# Patient Record
Sex: Male | Born: 1990 | Race: White | Hispanic: No | Marital: Single | Smoking: Never smoker
Health system: Southern US, Community
[De-identification: ages and names within clinical notes are randomized; demographics above are authoritative.]

## PROBLEM LIST (undated history)

## (undated) DIAGNOSIS — M109 Gout, unspecified: Secondary | ICD-10-CM

## (undated) DIAGNOSIS — I10 Essential (primary) hypertension: Secondary | ICD-10-CM

## (undated) DIAGNOSIS — N200 Calculus of kidney: Secondary | ICD-10-CM

## (undated) HISTORY — PX: KIDNEY STONE SURGERY: SHX686

## (undated) HISTORY — PX: APPENDECTOMY: SHX54

---
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
## 2014-11-23 ENCOUNTER — Emergency Department
Admission: EM | Admit: 2014-11-23 | Discharge: 2014-11-23 | Disposition: A | Discharge status: Home / Self Care | Payer: Self-pay | Attending: Emergency Medicine | Admitting: Emergency Medicine

## 2014-11-23 ENCOUNTER — Emergency Department: Payer: Self-pay

## 2014-11-23 DIAGNOSIS — N200 Calculus of kidney: Secondary | ICD-10-CM | POA: Insufficient documentation

## 2014-11-23 DIAGNOSIS — R109 Unspecified abdominal pain: Secondary | ICD-10-CM

## 2014-11-23 LAB — URINALYSIS COMPLETE WITH MICROSCOPIC (ARMC ONLY)
BACTERIA UA: NONE SEEN
Bilirubin Urine: NEGATIVE
Glucose, UA: NEGATIVE mg/dL
Ketones, ur: NEGATIVE mg/dL
LEUKOCYTES UA: NEGATIVE
Nitrite: NEGATIVE
PH: 6 (ref 5.0–8.0)
PROTEIN: NEGATIVE mg/dL
Specific Gravity, Urine: 1.014 (ref 1.005–1.030)

## 2014-11-23 LAB — CBC
HEMATOCRIT: 42.9 % (ref 40.0–52.0)
Hemoglobin: 15.1 g/dL (ref 13.0–18.0)
MCH: 34.7 pg — AB (ref 26.0–34.0)
MCHC: 35.2 g/dL (ref 32.0–36.0)
MCV: 98.6 fL (ref 80.0–100.0)
PLATELETS: 145 10*3/uL — AB (ref 150–440)
RBC: 4.35 MIL/uL — AB (ref 4.40–5.90)
RDW: 13.2 % (ref 11.5–14.5)
WBC: 7.2 10*3/uL (ref 3.8–10.6)

## 2014-11-23 LAB — BASIC METABOLIC PANEL
ANION GAP: 10 (ref 5–15)
BUN: 15 mg/dL (ref 6–20)
CO2: 28 mmol/L (ref 22–32)
Calcium: 9.5 mg/dL (ref 8.9–10.3)
Chloride: 98 mmol/L — ABNORMAL LOW (ref 101–111)
Creatinine, Ser: 1.28 mg/dL — ABNORMAL HIGH (ref 0.61–1.24)
GFR calc Af Amer: >60 mL/min (ref >60)
GLUCOSE: 129 mg/dL — AB (ref 65–99)
POTASSIUM: 3.3 mmol/L — AB (ref 3.5–5.1)
Sodium: 136 mmol/L (ref 135–145)

## 2014-11-23 MED ORDER — HYDROMORPHONE HCL 1 MG/ML IJ SOLN
INTRAMUSCULAR | Status: AC
  Administered 2014-11-23: 1 mg via INTRAVENOUS
  Filled 2014-11-23: qty 1

## 2014-11-23 MED ORDER — OXYCODONE-ACETAMINOPHEN 5-325 MG PO TABS: 1.0000 | ORAL_TABLET | Freq: Four times a day (QID) | ORAL | Status: DC | PRN

## 2014-11-23 MED ORDER — ONDANSETRON 4 MG PO TBDP
4.0000 mg | ORAL_TABLET | Freq: Four times a day (QID) | ORAL | Status: DC | PRN
Start: 2014-11-23 — End: 2018-03-30

## 2014-11-23 MED ORDER — HYDROMORPHONE HCL 1 MG/ML IJ SOLN
1.0000 mg | Freq: Once | INTRAMUSCULAR | Status: AC
  Administered 2014-11-23: 1 mg via INTRAVENOUS

## 2014-11-23 MED ORDER — ONDANSETRON HCL 4 MG/2ML IJ SOLN
INTRAMUSCULAR | Status: AC
  Administered 2014-11-23: 4 mg via INTRAVENOUS
  Filled 2014-11-23: qty 2

## 2014-11-23 MED ORDER — KETOROLAC TROMETHAMINE 30 MG/ML IJ SOLN: 30.0000 mg | Freq: Once | INTRAMUSCULAR | Status: DC

## 2014-11-23 MED ORDER — KETOROLAC TROMETHAMINE 30 MG/ML IJ SOLN
30.0000 mg | Freq: Once | INTRAMUSCULAR | Status: AC
  Administered 2014-11-23: 30 mg via INTRAVENOUS
  Filled 2014-11-23: qty 1

## 2014-11-23 MED ORDER — ONDANSETRON HCL 4 MG/2ML IJ SOLN
4.0000 mg | Freq: Once | INTRAMUSCULAR | Status: AC
  Administered 2014-11-23: 4 mg via INTRAVENOUS

## 2014-11-23 NOTE — ED Notes (Signed)
Pt in with co left flank pian that started around 0300 with generalized abd pain.

## 2014-11-23 NOTE — ED Provider Notes (Signed)
[facility] Emergency Department Provider Note  ____________________________________________  Time seen: Approximately 7:10 AM  I have reviewed the triage vital signs and the nursing notes.   HISTORY  Chief Complaint Flank Pain    HPI Curtis Erickson is a 24 y.o. male with no medical history except for his appendix previously removed. He reports waking up during the early morning with severe pain in his left back and flank. Reports a sharp and heavy pain in the left flank. He had up and did have the urge to urinate once but did not know same blood. No fevers or chills. Some nausea, no vomiting. No other pain. No chest pain. No trouble breathing. No pain in the groin or testicles.   No past medical history on file.  There are no active problems to display for this patient.   No past surgical history on file.  Current Outpatient Rx  Name  Route  Sig  Dispense  Refill  . ondansetron (ZOFRAN ODT) 4 MG disintegrating tablet   Oral   Take 1 tablet (4 mg total) by mouth every 6 (six) hours as needed for nausea or vomiting.   20 tablet   0   . oxyCODONE-acetaminophen (ROXICET) 5-325 MG per tablet   Oral   Take 1 tablet by mouth every 6 (six) hours as needed for severe pain.   20 tablet   0     Allergies Review of patient's allergies indicates no known allergies.  No family history on file.  Social History Social History  Substance Use Topics  . Smoking status: Not on file  . Smokeless tobacco: Not on file  . Alcohol Use: Not on file   occasional alcohol use, nonsmoker  Review of Systems Constitutional: No fever/chills Eyes: No visual changes. ENT: No sore throat. Cardiovascular: Denies chest pain. Respiratory: Denies shortness of breath. Gastrointestinal: See history of present illness No diarrhea.  No constipation. Genitourinary: Negative for dysuria. Musculoskeletal: Negative for back pain except along the left flank Skin: Negative for  rash. Neurological: Negative for headaches, focal weakness or numbness.  10-point ROS otherwise negative.  ____________________________________________   PHYSICAL EXAM:  VITAL SIGNS: ED Triage Vitals  Enc Vitals Group     BP 11/23/14 0518 156/87 mmHg     Pulse Rate 11/23/14 0518 70     Resp 11/23/14 0518 18     Temp 11/23/14 0518 98.3 F (36.8 C)     Temp Source 11/23/14 0518 Oral     SpO2 11/23/14 0518 97 %     Weight 11/23/14 0518 190 lb (86.183 kg)     Height 11/23/14 0518  (1.753 m)     Head Cir --      Peak Flow --      Pain Score 11/23/14 0519 9     Pain Loc --      Pain Edu? --      Excl. in GC? --     Constitutional: Alert and oriented. Well appearing and in no acute distress. Eyes: Conjunctivae are normal. PERRL. EOMI. Head: Atraumatic. Nose: No congestion/rhinnorhea. Mouth/Throat: Mucous membranes are moist.  Oropharynx non-erythematous. Neck: No stridor.   Cardiovascular: Normal rate, regular rhythm. Grossly normal heart sounds.  Good peripheral circulation. Respiratory: Normal respiratory effort.  No retractions. Lungs CTAB. Gastrointestinal: Soft and nontender except for some moderate discomfort in the left flank. No distention. No abdominal bruits. Moderate left CVA tenderness. No right CVA tenderness. Normal testicles, nontender. No groin pain or mass.  Normal penis. Musculoskeletal: No lower extremity tenderness nor edema.  No joint effusions. Neurologic:  Normal speech and language. No gross focal neurologic deficits are appreciated. No gait instability. Skin:  Skin is warm, dry and intact. No rash noted. Psychiatric: Mood and affect are normal. Speech and behavior are normal.  ____________________________________________   LABS (all labs ordered are listed, but only abnormal results are displayed)  Labs Reviewed  CBC - Abnormal; Notable for the following:    RBC 4.35 (*)    MCH 34.7 (*)    Platelets 145 (*)    All other components within  normal limits  BASIC METABOLIC PANEL - Abnormal; Notable for the following:    Potassium 3.3 (*)    Chloride 98 (*)    Glucose, Bld 129 (*)    Creatinine, Ser 1.28 (*)    All other components within normal limits  URINALYSIS COMPLETEWITH MICROSCOPIC ([facility] ONLY) - Abnormal; Notable for the following:    Color, Urine YELLOW (*)    APPearance CLEAR (*)    Hgb urine dipstick 1+ (*)    Squamous Epithelial / LPF 0-5 (*)    All other components within normal limits   ____________________________________________  EKG   ____________________________________________  RADIOLOGY  3 mm calculus left ureterovesical junction with severe hydronephrosis on the left. There is a 1 mm nonobstructing calculus in the left kidney midportion.  Appendix absent.  Wall thickening in the urinary bladder. Suspect a degree of underlying cystitis.  No right-sided renal or ureteral calculus. No hydronephrosis on the right.  No bowel obstruction. No abscess. ____________________________________________   PROCEDURES  Procedure(s) performed: None  Critical Care performed: No  ____________________________________________   INITIAL IMPRESSION / ASSESSMENT AND PLAN / ED COURSE  Pertinent labs & imaging results that were available during my care of the patient were reviewed by me and considered in my medical decision making (see chart for details).  Sudden onset of left flank pain at 3 AM. Otherwise healthy young male with the history of appendix removed. Differential diagnosis would include kidney stone, nephrolithiasis is most likely etiologies, though other considerations including gastritis, perforation, ulcer, diverticulitis, bowel obstruction are considered. In the setting of sudden onset left flank pain, I will evaluate with CT without contrast for what clinically appears most apparently a kidney stone. No evidence of fever or infection. Urinalysis shows no signs of infection.  CT scan  demonstrates a 3 mm stone on the left, which correlates the patient's symptoms. Appears patient is presenting with a left-sided kidney stone without evidence of acute infection or complication. There is notable hydronephrosis, but the setting of a 3 mm down I would expect the patient could pass this at home with good return precautions discussed with him and family. He is agreeable.  I will prescribe the patient a narcotic pain medicine due to their condition which I anticipate will cause at least moderate pain short term. I discussed with the patient safe use of narcotic pain medicines, and that they are not to drive, work in dangerous areas, or ever take more than prescribed (no more than 1 pill every 6 hours). We discussed that this is the type of medication that "Criss Alvine" may have overdosed on and the risks of this type of medicine. Patient is very agreeable to only use as prescribed and to never use more than prescribed.  ____________________________________________   FINAL CLINICAL IMPRESSION(S) / ED DIAGNOSES  Final diagnoses:  Left flank pain  Kidney stone on left side  Sharyn Creamer, MD 11/23/14 [phone]

## 2014-11-23 NOTE — Discharge Instructions (Signed)
You have been seen in the Emergency Department (ED) today for pain that we believe based on your workup, is caused by kidney stones.  As we have discussed, please drink plenty of fluids.  Please make a follow up appointment with the physician(s) listed elsewhere in this documentation. ° °You may take pain medication as needed but ONLY as prescribed.  Please also take your prescribed Flomax daily.  We also recommend that you take over-the-counter ibuprofen regularly according to label instructions over the next 5 days.  Take it with meals to minimize stomach discomfort. ° °Please see your doctor as soon as possible as stones may take 1-3 weeks to pass and you may require additional care or medications. ° °Do not drink alcohol, drive or participate in any other potentially dangerous activities while taking opiate pain medication as it may make you sleepy. Do not take this medication with any other sedating medications, either prescription or over-the-counter. If you were prescribed Percocet or Vicodin, do not take these with acetaminophen (Tylenol) as it is already contained within these medications. °  °This medication is an opiate (or narcotic) pain medication and can be habit forming.  Use it as little as possible to achieve adequate pain control.  Do not use or use it with extreme caution if you have a history of opiate abuse or dependence.  If you are on a pain contract with your primary care doctor or a pain specialist, be sure to let them know you were prescribed this medication today from the [facility].  This medication is intended for your use only - do not give any to anyone else and keep it in a secure place where nobody else, especially children, have access to it.  It will also cause or worsen constipation, so you may want to consider taking an over-the-counter stool softener while you are taking this medication. ° °Return to the Emergency Department (ED) or call your doctor  if you have any worsening pain, fever, painful urination, are unable to urinate, or develop other symptoms that concern you. ° ° °Kidney Stones °Kidney stones (urolithiasis) are deposits that form inside your kidneys. The intense pain is caused by the stone moving through the urinary tract. When the stone moves, the ureter goes into spasm around the stone. The stone is usually passed in the urine.  °CAUSES  °· A disorder that makes certain neck glands produce too much parathyroid hormone (primary hyperparathyroidism). °· A buildup of uric acid crystals, similar to gout in your joints. °· Narrowing (stricture) of the ureter. °· A kidney obstruction present at birth (congenital obstruction). °· Previous surgery on the kidney or ureters. °· Numerous kidney infections. °SYMPTOMS  °· Feeling sick to your stomach (nauseous). °· Throwing up (vomiting). °· Blood in the urine (hematuria). °· Pain that usually spreads (radiates) to the groin. °· Frequency or urgency of urination. °DIAGNOSIS  °· Taking a history and physical exam. °· Blood or urine tests. °· CT scan. °· Occasionally, an examination of the inside of the urinary bladder (cystoscopy) is performed. °TREATMENT  °· Observation. °· Increasing your fluid intake. °· Extracorporeal shock wave lithotripsy--This is a noninvasive procedure that uses shock waves to break up kidney stones. °· Surgery may be needed if you have severe pain or persistent obstruction. There are various surgical procedures. Most of the procedures are performed with the use of small instruments. Only small incisions are needed to accommodate these instruments, so recovery time is minimized. °The size, location,   and chemical composition are all important variables that will determine the proper choice of action for you. Talk to your health care provider to better understand your situation so that you will minimize the risk of injury to yourself and your kidney.  °HOME CARE INSTRUCTIONS  °· Drink  enough water and fluids to keep your urine clear or pale yellow. This will help you to pass the stone or stone fragments. °· Strain all urine through the provided strainer. Keep all particulate matter and stones for your health care provider to see. The stone causing the pain may be as small as a grain of salt. It is very important to use the strainer each and every time you pass your urine. The collection of your stone will allow your health care provider to analyze it and verify that a stone has actually passed. The stone analysis will often identify what you can do to reduce the incidence of recurrences. °· Only take over-the-counter or prescription medicines for pain, discomfort, or fever as directed by your health care provider. °· Make a follow-up appointment with your health care provider as directed. °· Get follow-up X-rays if required. The absence of pain does not always mean that the stone has passed. It may have only stopped moving. If the urine remains completely obstructed, it can cause loss of kidney function or even complete destruction of the kidney. It is your responsibility to make sure X-rays and follow-ups are completed. Ultrasounds of the kidney can show blockages and the status of the kidney. Ultrasounds are not associated with any radiation and can be performed easily in a matter of minutes. °SEEK MEDICAL CARE IF: °· You experience pain that is progressive and unresponsive to any pain medicine you have been prescribed. °SEEK IMMEDIATE MEDICAL CARE IF:  °· Pain cannot be controlled with the prescribed medicine. °· You have a fever or shaking chills. °· The severity or intensity of pain increases over 18 hours and is not relieved by pain medicine. °· You develop a new onset of abdominal pain. °· You feel faint or pass out. °· You are unable to urinate. °MAKE SURE YOU:  °· Understand these instructions. °· Will watch your condition. °· Will get help right away if you are not doing well or get  worse. °Document Released: 03/26/2005 Document Revised: 11/26/2012 Document Reviewed: 08/27/2012 °[employer]® Patient Information ©2015 [employer]. This information is not intended to replace advice given to you by your health care provider. Make sure you discuss any questions you have with your health care provider. ° °

## 2018-03-30 ENCOUNTER — Encounter: Payer: Self-pay | Admitting: Emergency Medicine

## 2018-03-30 ENCOUNTER — Emergency Department
Admission: EM | Admit: 2018-03-30 | Discharge: 2018-03-30 | Disposition: A | Discharge status: Home / Self Care | Payer: Self-pay | Attending: Emergency Medicine | Admitting: Emergency Medicine

## 2018-03-30 ENCOUNTER — Emergency Department: Payer: Self-pay

## 2018-03-30 DIAGNOSIS — S93402A Sprain of unspecified ligament of left ankle, initial encounter: Secondary | ICD-10-CM | POA: Insufficient documentation

## 2018-03-30 DIAGNOSIS — Y999 Unspecified external cause status: Secondary | ICD-10-CM | POA: Insufficient documentation

## 2018-03-30 DIAGNOSIS — X509XXA Other and unspecified overexertion or strenuous movements or postures, initial encounter: Secondary | ICD-10-CM | POA: Insufficient documentation

## 2018-03-30 DIAGNOSIS — Y929 Unspecified place or not applicable: Secondary | ICD-10-CM | POA: Insufficient documentation

## 2018-03-30 DIAGNOSIS — M25472 Effusion, left ankle: Secondary | ICD-10-CM | POA: Insufficient documentation

## 2018-03-30 DIAGNOSIS — Y939 Activity, unspecified: Secondary | ICD-10-CM | POA: Insufficient documentation

## 2018-03-30 HISTORY — DX: Calculus of kidney: N20.0

## 2018-03-30 MED ORDER — DICLOFENAC SODIUM 50 MG PO TBEC: 50.0000 mg | DELAYED_RELEASE_TABLET | Freq: Two times a day (BID) | ORAL | 0 refills | Status: AC

## 2018-03-30 MED ORDER — NAPROXEN 500 MG PO TABS
500.0000 mg | ORAL_TABLET | Freq: Once | ORAL | Status: AC
  Administered 2018-03-30: 500 mg via ORAL
  Filled 2018-03-30: qty 1

## 2018-03-30 MED ORDER — CYCLOBENZAPRINE HCL 5 MG PO TABS: 5.0000 mg | ORAL_TABLET | Freq: Three times a day (TID) | ORAL | 0 refills | Status: DC | PRN

## 2018-03-30 MED ORDER — CYCLOBENZAPRINE HCL 10 MG PO TABS
10.0000 mg | ORAL_TABLET | Freq: Once | ORAL | Status: AC
  Administered 2018-03-30: 10 mg via ORAL
  Filled 2018-03-30: qty 1

## 2018-03-30 NOTE — ED Triage Notes (Signed)
Patient states that he went running Wednesday and Thursday he woke up with left ankle pain. Patient states that the pain has been getting worse and that he is now unable to bear any weight on it.

## 2018-03-30 NOTE — ED Provider Notes (Signed)
[facility] Emergency Department Provider Note ____________________________________________  Time seen: 2020  I have reviewed the triage vital signs and the nursing notes.  HISTORY  Chief Complaint  Ankle Pain  HPI Curtis Jeffersonimothy A Erickson is a 27 y.o. male who presents to the ED for evaluation of pain and disability to the left ankle.  Patient describes he went running 2 days earlier in the week, and he woke up with left ankle pain and.  He denies any known injury, accident, or brain injury.  His pain is been worsening, and he now finds it difficult to bear weight to the ankle.  He denies any other injury at this time.  He denies any chronic ongoing ankle or foot arthropathies.  Past Medical History:  Diagnosis Date  . Kidney stone     There are no active problems to display for this patient.   Past Surgical History:  Procedure Laterality Date  . APPENDECTOMY    . KIDNEY STONE SURGERY      Prior to Admission medications   Medication Sig Start Date End Date Taking? Authorizing Provider  ondansetron (ZOFRAN ODT) 4 MG disintegrating tablet Take 1 tablet (4 mg total) by mouth every 6 (six) hours as needed for nausea or vomiting. 11/23/14   Sharyn CreamerQuale, Mark, MD  oxyCODONE-acetaminophen (ROXICET) 5-325 MG per tablet Take 1 tablet by mouth every 6 (six) hours as needed for severe pain. 11/23/14   Sharyn CreamerQuale, Mark, MD    Allergies Patient has no known allergies.  No family history on file.  Social History Social History   Tobacco Use  . Smoking status: Never Smoker  . Smokeless tobacco: Never Used  Substance Use Topics  . Alcohol use: Not on file  . Drug use: Not on file    Review of Systems  Constitutional: Negative for fever. Cardiovascular: Negative for chest pain. Respiratory: Negative for shortness of breath. Gastrointestinal: Negative for abdominal pain, vomiting and diarrhea. Musculoskeletal: Negative for back pain.  Left ankle pain as above. Skin: Negative  for rash. Neurological: Negative for headaches, focal weakness or numbness. ____________________________________________  PHYSICAL EXAM:  VITAL SIGNS: ED Triage Vitals  Enc Vitals Group     BP 03/30/18 1954 (!) 146/56     Pulse Rate 03/30/18 1954 (!) 125     Resp 03/30/18 1954 18     Temp 03/30/18 1954 98.2 F (36.8 C)     Temp Source 03/30/18 1954 Oral     SpO2 03/30/18 1954 97 %     Weight 03/30/18 1956 210 lb (95.3 kg)     Height 03/30/18 1956 5\' 8"  (1.727 m)     Head Circumference --      Peak Flow --      Pain Score 03/30/18 1956 10     Pain Loc --      Pain Edu? --      Excl. in GC? --     Constitutional: Alert and oriented. Well appearing and in no distress. Head: Normocephalic and atraumatic. Eyes: Conjunctivae are normal. Normal extraocular movements Cardiovascular: Normal rate, regular rhythm. Normal distal pulses. Respiratory: Normal respiratory effort. No wheezes/rales/rhonchi. Musculoskeletal: left ankle without obvious deformity or dislocation. Mild joint effusion noted. Normal AROM noted. Negative anterior/posterior drawer. No calf or achilles tenderness noted. Nontender with normal range of motion in all extremities.  Neurologic:  Normal gait without ataxia. Normal speech and language. No gross focal neurologic deficits are appreciated. Skin:  Skin is warm, dry and intact. No rash noted. __________________________________________  RADIOLOGY  Left ankle  IMPRESSION: No acute osseous abnormality.  Small ankle effusion ____________________________________________  PROCEDURES  Procedures Flexeril 10 mg PO Naproxen 500 mg PO Post-op shoe Stirrup splint crutches ____________________________________________  INITIAL IMPRESSION / ASSESSMENT AND PLAN / ED COURSE  Patient with ED evaluation of right ankle pain and swelling.  Patient's clinical picture is consistent with a likely sprain and joint effusion with underlying bone spurs.  No x-ray evidence of  fracture or dislocation.  Patient is placed in a stirrup splint and postop shoe for support.  He is given crutches to weight-bear as tolerated.  He is referred to podiatry for ongoing symptom management.  A prescription for Flexeril and diclofenac provided for muscle spasm and anti-inflammatory relief, respectively. ____________________________________________  FINAL CLINICAL IMPRESSION(S) / ED DIAGNOSES  Final diagnoses:  Sprain of left ankle, unspecified ligament, initial encounter      Lissa HoardMenshew, Myrtha Tonkovich V Bacon, PA-C 03/30/18 2132    Dionne BucySiadecki, Sebastian, MD 03/30/18 2244

## 2018-03-30 NOTE — Discharge Instructions (Addendum)
Your exam and x-ray reveals bone spurs and joint effusion, without bony fracture or dislocation. Wear the splint and post-op shoe as needed for support. Use the crutches to bear weight as tolerated. Take the prescription muscle relaxant up to 3 times a day, but avoid dosing before driving or operating machinery. Take the anti-inflammatory as directed. Follow-up with podiatry for ongoing symptoms.

## 2018-08-31 ENCOUNTER — Emergency Department: Payer: Self-pay

## 2018-08-31 ENCOUNTER — Emergency Department
Admission: EM | Admit: 2018-08-31 | Discharge: 2018-08-31 | Disposition: A | Discharge status: Home / Self Care | Payer: Self-pay | Attending: Emergency Medicine | Admitting: Emergency Medicine

## 2018-08-31 ENCOUNTER — Other Ambulatory Visit: Payer: Self-pay

## 2018-08-31 DIAGNOSIS — I10 Essential (primary) hypertension: Secondary | ICD-10-CM | POA: Insufficient documentation

## 2018-08-31 DIAGNOSIS — M79671 Pain in right foot: Secondary | ICD-10-CM | POA: Insufficient documentation

## 2018-08-31 HISTORY — DX: Essential (primary) hypertension: I10

## 2018-08-31 MED ORDER — MELOXICAM 15 MG PO TABS: 15.0000 mg | ORAL_TABLET | Freq: Every day | ORAL | 1 refills | Status: AC

## 2018-08-31 NOTE — ED Provider Notes (Signed)
[facility] Emergency Department [facility] Note  ____________________________________________  Time seen: Approximately 11:34 PM  I have reviewed the triage vital signs and the nursing notes.   HISTORY  Chief Complaint Foot Pain    HPI Curtis Erickson is a 28 y.o. male presents to the emergency department with acute right foot pain for the past 3 to 4 days.  Patient reports that he has a history of spurring of the left foot and became concerned that he was also developing spurs on the right.  He localizes pain along the dorsal aspect of the right foot in the distribution of the metatarsals.  He denies falls or mechanisms of trauma.  He denies calf pain or calf swelling.  No other alleviating measures have been attempted.        Past Medical History:  Diagnosis Date  . Hypertension   . Kidney stone     There are no active problems to display for this patient.   Past Surgical History:  Procedure Laterality Date  . APPENDECTOMY    . KIDNEY STONE SURGERY      Prior to Admission medications   Medication Sig Start Date End Date Taking? Authorizing Provider  cyclobenzaprine (FLEXERIL) 5 MG tablet Take 1 tablet (5 mg total) by mouth 3 (three) times daily as needed for muscle spasms. 03/30/18   Menshew, Charlesetta IvoryJenise V Bacon, PA-C  meloxicam (MOBIC) 15 MG tablet Take 1 tablet (15 mg total) by mouth daily for 7 days. 08/31/18 09/07/18  Orvil FeilWoods, Amberlynn Tempesta M, PA-C    Allergies Patient has no known allergies.  No family history on file.  Social History Social History   Tobacco Use  . Smoking status: Never Smoker  . Smokeless tobacco: Never Used  Substance Use Topics  . Alcohol use: Not Currently  . Drug use: Not Currently     Review of Systems  Constitutional: No fever/chills Eyes: No visual changes. No discharge ENT: No upper respiratory complaints. Cardiovascular: no chest pain. Respiratory: no cough. No SOB. Gastrointestinal: No abdominal pain.  No  nausea, no vomiting.  No diarrhea.  No constipation. Genitourinary: Negative for dysuria. No hematuria Musculoskeletal: Patient has right foot pain.  Skin: Negative for rash, abrasions, lacerations, ecchymosis. Neurological: Negative for headaches, focal weakness or numbness.   ____________________________________________   PHYSICAL EXAM:  VITAL SIGNS: ED Triage Vitals  Enc Vitals Group     BP 08/31/18 1604 (!) 165/105     Pulse Rate 08/31/18 1604 (!) 101     Resp 08/31/18 1604 17     Temp 08/31/18 1604 98.4 F (36.9 C)     Temp src --      SpO2 08/31/18 1604 96 %     Weight 08/31/18 1605 210 lb (95.3 kg)     Height 08/31/18 1605 5\' 8"  (1.727 m)     Head Circumference --      Peak Flow --      Pain Score 08/31/18 1605 7     Pain Loc --      Pain Edu? --      Excl. in GC? --      Constitutional: Alert and oriented. Well appearing and in no acute distress. Eyes: Conjunctivae are normal. PERRL. EOMI. Head: Atraumatic. Cardiovascular: Normal rate, regular rhythm. Normal S1 and S2.  Good peripheral circulation. Respiratory: Normal respiratory effort without tachypnea or retractions. Lungs CTAB. Good air entry to the bases with no decreased or absent breath sounds. Musculoskeletal: Patient is able to perform full  range of motion at the right hip, right knee and right ankle.  He is able to move all 5 right toes.  There is no edema of the right lower extremity.  Patient has tenderness to palpation over the metatarsals.  He has no pain with palpation over the insertion of the plantar fascia.  Palpable dorsalis pedis pulse bilaterally and symmetrically. Neurologic:  Normal speech and language. No gross focal neurologic deficits are appreciated.  Skin: No erythema of the right foot. Psychiatric: Mood and affect are normal. Speech and behavior are normal. Patient exhibits appropriate insight and judgement.   ____________________________________________   LABS (all labs ordered are  listed, but only abnormal results are displayed)  Labs Reviewed - No data to display ____________________________________________  EKG   ____________________________________________  RADIOLOGY I personally viewed and evaluated these images as part of my medical decision making, as well as reviewing the written report by the radiologist.  Dg Foot Complete Right  Result Date: 08/31/2018 CLINICAL DATA:  Right-sided foot pain EXAM: RIGHT FOOT COMPLETE - 3+ VIEW COMPARISON:  None. FINDINGS: There is no evidence of fracture or dislocation. There is no evidence of arthropathy or other focal bone abnormality. Soft tissues are unremarkable. IMPRESSION: Negative. Electronically Signed   By: Katherine Mantle M.D.   On: 08/31/2018 19:51    ____________________________________________    PROCEDURES  Procedure(s) performed:    Procedures    Medications - No data to display   ____________________________________________   INITIAL IMPRESSION / ASSESSMENT AND PLAN / ED COURSE  Pertinent labs & imaging results that were available during my care of the patient were reviewed by me and considered in my medical decision making (see chart for details).  Review of the  [place]RS was performed in accordance of the [place] prior to dispensing any controlled drugs.           Assessment and plan Right foot pain Patient presents to the emergency department with acute right foot pain in the distribution of the metatarsals.  On physical exam, patient had some tenderness to palpation over the metatarsals.  He had normal sensation and a palpable dorsalis pedis pulse.  X-ray examination of the right foot revealed no bony abnormality.  Patient was referred to podiatry.  He was discharged with meloxicam.  All patient questions were answered.    ____________________________________________  FINAL CLINICAL IMPRESSION(S) / ED DIAGNOSES  Final diagnoses:  Right foot pain      NEW MEDICATIONS  STARTED DURING THIS VISIT:  ED Discharge Orders         Ordered    meloxicam (MOBIC) 15 MG tablet  Daily     08/31/18 2019              This chart was dictated using voice recognition software/Dragon. Despite best efforts to proofread, errors can occur which can change the meaning. Any change was purely unintentional.    Orvil Feil, PA-C 08/31/18 2338    Emily Filbert, MD 09/01/18 [phone]

## 2018-08-31 NOTE — ED Triage Notes (Signed)
pt c/o right foot pain for the past 3-4 days. Denies injury has a hx of bone spurs in the left foot.

## 2018-10-03 ENCOUNTER — Ambulatory Visit (INDEPENDENT_AMBULATORY_CARE_PROVIDER_SITE_OTHER): Payer: Self-pay | Admitting: Podiatry

## 2018-10-03 ENCOUNTER — Encounter: Payer: Self-pay | Admitting: Podiatry

## 2018-10-03 ENCOUNTER — Ambulatory Visit (INDEPENDENT_AMBULATORY_CARE_PROVIDER_SITE_OTHER): Payer: Self-pay

## 2018-10-03 ENCOUNTER — Other Ambulatory Visit: Payer: Self-pay

## 2018-10-03 ENCOUNTER — Other Ambulatory Visit: Payer: Self-pay | Admitting: Podiatry

## 2018-10-03 DIAGNOSIS — M109 Gout, unspecified: Secondary | ICD-10-CM

## 2018-10-03 DIAGNOSIS — M79672 Pain in left foot: Secondary | ICD-10-CM

## 2018-10-03 DIAGNOSIS — M10072 Idiopathic gout, left ankle and foot: Secondary | ICD-10-CM

## 2018-10-03 MED ORDER — COLCHICINE 0.6 MG PO TABS: 0.6000 mg | ORAL_TABLET | Freq: Every day | ORAL | 0 refills | Status: DC

## 2018-10-04 LAB — URIC ACID: Uric Acid: 8.4 mg/dL (ref 3.7–8.6)

## 2018-10-08 NOTE — Progress Notes (Signed)
   HPI: 28 year old male presenting today as a new patient with a chief complaint of pain and swelling noted to the left ankle that began about one week ago. He states the pain is located all around his ankle. Bearing weight and trying to walk increases the pain. He has not had any treatment regarding the symptoms. Patient is here for further evaluation and treatment.   Past Medical History:  Diagnosis Date  . Hypertension   . Kidney stone      Physical Exam: General: The patient is alert and oriented x3 in no acute distress.  Dermatology: Skin is warm, dry and supple bilateral lower extremities. Negative for open lesions or macerations.  Vascular: Palpable pedal pulses bilaterally. Capillary refill within normal limits.  Neurological: Epicritic and protective threshold grossly intact bilaterally.   Musculoskeletal Exam: Pain with palpation, erythema and edema noted to the anterior, lateral and medial aspects of the left ankle. Range of motion within normal limits to all pedal and ankle joints bilateral. Muscle strength 5/5 in all groups bilateral.   Radiographic Exam:  Normal osseous mineralization. Joint spaces preserved. No fracture/dislocation/boney destruction.    Assessment: 1. Acute gout / capsulitis left ankle    Plan of Care:  1. Patient evaluated. X-Rays reviewed.  2. Injection of 0.5 mLs Celestone Soluspan injected into the left ankle joint.  3. Prescription for Colchicine 0.6 mg provided to patient.  4. Labs for uric acid levels done today. Office will call patient regarding results.  5. Return to clinic as needed.    10/08/2018 - spoke with patient regarding uric acid results. Patient's ankle feels much better today after the injection and Colchicine. Recommend controlling uric acid by diet alone at the moment. If attacks recur with will discuss possible Allopurinol medication. RTC PRN.    Edrick Kins, DPM [facility]  Dr. Edrick Kins, DPM     [address], [place] [address]                Office [phone]  Fax [phone]

## 2018-10-12 ENCOUNTER — Emergency Department
Admission: EM | Admit: 2018-10-12 | Discharge: 2018-10-12 | Disposition: A | Discharge status: Home / Self Care | Payer: Self-pay | Attending: Emergency Medicine | Admitting: Emergency Medicine

## 2018-10-12 ENCOUNTER — Other Ambulatory Visit: Payer: Self-pay

## 2018-10-12 ENCOUNTER — Encounter: Payer: Self-pay | Admitting: Emergency Medicine

## 2018-10-12 ENCOUNTER — Emergency Department: Payer: Self-pay

## 2018-10-12 DIAGNOSIS — R531 Weakness: Secondary | ICD-10-CM | POA: Insufficient documentation

## 2018-10-12 DIAGNOSIS — I10 Essential (primary) hypertension: Secondary | ICD-10-CM | POA: Insufficient documentation

## 2018-10-12 LAB — URINALYSIS, COMPLETE (UACMP) WITH MICROSCOPIC
Bilirubin Urine: NEGATIVE
Glucose, UA: NEGATIVE mg/dL
Hgb urine dipstick: NEGATIVE
Ketones, ur: NEGATIVE mg/dL
Leukocytes,Ua: NEGATIVE
Nitrite: NEGATIVE
Protein, ur: NEGATIVE mg/dL
Specific Gravity, Urine: 1.014 (ref 1.005–1.030)
Squamous Epithelial / HPF: NONE SEEN (ref 0–5)
WBC, UA: NONE SEEN WBC/hpf (ref 0–5)
pH: 8 (ref 5.0–8.0)

## 2018-10-12 LAB — URINE DRUG SCREEN, QUALITATIVE (ARMC ONLY)
Amphetamines, Ur Screen: NOT DETECTED
Barbiturates, Ur Screen: NOT DETECTED
Benzodiazepine, Ur Scrn: POSITIVE — AB
Cannabinoid 50 Ng, Ur ~~LOC~~: NOT DETECTED
Cocaine Metabolite,Ur ~~LOC~~: NOT DETECTED
MDMA (Ecstasy)Ur Screen: NOT DETECTED
Methadone Scn, Ur: NOT DETECTED
Opiate, Ur Screen: NOT DETECTED
Phencyclidine (PCP) Ur S: NOT DETECTED
Tricyclic, Ur Screen: NOT DETECTED

## 2018-10-12 LAB — CBC
HCT: 41.6 % (ref 39.0–52.0)
Hemoglobin: 14.4 g/dL (ref 13.0–17.0)
MCH: 35.9 pg — ABNORMAL HIGH (ref 26.0–34.0)
MCHC: 34.6 g/dL (ref 30.0–36.0)
MCV: 103.7 fL — ABNORMAL HIGH (ref 80.0–100.0)
Platelets: 167 10*3/uL (ref 150–400)
RBC: 4.01 MIL/uL — ABNORMAL LOW (ref 4.22–5.81)
RDW: 12.5 % (ref 11.5–15.5)
WBC: 6.9 10*3/uL (ref 4.0–10.5)
nRBC: 0 % (ref 0.0–0.2)

## 2018-10-12 LAB — ETHANOL: Alcohol, Ethyl (B): <10 mg/dL (ref <10)

## 2018-10-12 LAB — BASIC METABOLIC PANEL
Anion gap: 10 (ref 5–15)
BUN: 9 mg/dL (ref 6–20)
CO2: 27 mmol/L (ref 22–32)
Calcium: 9.8 mg/dL (ref 8.9–10.3)
Chloride: 103 mmol/L (ref 98–111)
Creatinine, Ser: 0.98 mg/dL (ref 0.61–1.24)
GFR calc Af Amer: >60 mL/min (ref >60)
GFR calc non Af Amer: >60 mL/min (ref >60)
Glucose, Bld: 151 mg/dL — ABNORMAL HIGH (ref 70–99)
Potassium: 3.5 mmol/L (ref 3.5–5.1)
Sodium: 140 mmol/L (ref 135–145)

## 2018-10-12 LAB — GLUCOSE, CAPILLARY: Glucose-Capillary: 139 mg/dL — ABNORMAL HIGH (ref 70–99)

## 2018-10-12 MED ORDER — SODIUM CHLORIDE 0.9 % IV SOLN
Freq: Once | INTRAVENOUS | Status: AC
  Administered 2018-10-12: 16:00:00 via INTRAVENOUS

## 2018-10-12 MED ORDER — CHLORDIAZEPOXIDE HCL 25 MG PO CAPS: ORAL_CAPSULE | ORAL | 0 refills | Status: DC

## 2018-10-12 NOTE — ED Provider Notes (Signed)
[facility] Emergency Department Provider Note       Time seen: ----------------------------------------- 2:57 PM on 10/12/2018 -----------------------------------------   I have reviewed the triage vital signs and the nursing notes.  HISTORY   Chief Complaint Weakness    HPI Curtis Erickson is a 28 y.o. male with a history of hypertension and renal colic who presents to the ED for weakness since Friday.  Patient also does not remember much of yesterday.  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  He reports he does drink alcohol on occasion, recently had a DUI but his alcohol level was only 0.03.  Recently treated with colchicine and had a steroid shot for gout.  Other people keep telling him that he is "off".  He was staggering recently as well.  Past Medical History:  Diagnosis Date  . Hypertension   . Kidney stone     There are no active problems to display for this patient.   Past Surgical History:  Procedure Laterality Date  . APPENDECTOMY    . KIDNEY STONE SURGERY      Allergies Patient has no known allergies.  Social History Social History   Tobacco Use  . Smoking status: Never Smoker  . Smokeless tobacco: Never Used  Substance Use Topics  . Alcohol use: Yes  . Drug use: Not Currently   Review of Systems Constitutional: Negative for fever. Cardiovascular: Negative for chest pain. Respiratory: Negative for shortness of breath. Gastrointestinal: Negative for abdominal pain, vomiting and diarrhea. Musculoskeletal: Negative for back pain. Skin: Negative for rash. Neurological: Positive for slurred speech, disorientation  All systems negative/normal/unremarkable except as stated in the HPI  ____________________________________________   PHYSICAL EXAM:  VITAL SIGNS: ED Triage Vitals  Enc Vitals Group     BP 10/12/18 1344 (!) 148/84     Pulse Rate 10/12/18 1344 (!) 108     Resp 10/12/18 1344 18      Temp 10/12/18 1344 98.6 F (37 C)     Temp Source 10/12/18 1344 Oral     SpO2 10/12/18 1344 97 %     Weight 10/12/18 1347 213 lb (96.6 kg)     Height 10/12/18 1347 5\' 8"  (1.727 m)     Head Circumference --      Peak Flow --      Pain Score 10/12/18 1347 0     Pain Loc --      Pain Edu? --      Excl. in GC? --    Constitutional: Alert and oriented. Well appearing and in no distress. Eyes: Conjunctivae are normal. Normal extraocular movements. ENT      Head: Normocephalic and atraumatic.      Nose: No congestion/rhinnorhea.      Mouth/Throat: Mucous membranes are moist.      Neck: No stridor. Cardiovascular: Normal rate, regular rhythm. No murmurs, rubs, or gallops. Respiratory: Normal respiratory effort without tachypnea nor retractions. Breath sounds are clear and equal bilaterally. No wheezes/rales/rhonchi. Gastrointestinal: Soft and nontender. Normal bowel sounds Musculoskeletal: Nontender with normal range of motion in extremities. No lower extremity tenderness nor edema. Neurologic: Slightly slurred speech is noted on occasion, no gross focal neurologic deficits are appreciated.  Strength, sensation, cranial nerves appear to be normal, no pronator drift Skin:  Skin is warm, dry and intact. No rash noted. Psychiatric: Mood and affect are normal. Speech and behavior are normal.  ____________________________________________  ED COURSE:  As part of my medical decision  making, I reviewed the following data within the [place] History obtained from family if available, nursing notes, old chart and ekg, as well as notes from prior ED visits. Patient presented for weakness, we will assess with labs and imaging as indicated at this time.   Procedures  Curtis Erickson was evaluated in Emergency Department on 10/12/2018 for the symptoms described in the history of present illness. He was evaluated in the context of the global COVID-19 pandemic, which necessitated  consideration that the patient might be at risk for infection with the SARS-CoV-2 virus that causes COVID-19. Institutional protocols and algorithms that pertain to the evaluation of patients at risk for COVID-19 are in a state of rapid change based on information released by regulatory bodies including the CDC and federal and state organizations. These policies and algorithms were followed during the patient's care in the ED.  ____________________________________________   LABS (pertinent positives/negatives)  Labs Reviewed  BASIC METABOLIC PANEL - Abnormal; Notable for the following components:      Result Value   Glucose, Bld 151 (*)    All other components within normal limits  CBC - Abnormal; Notable for the following components:   RBC 4.01 (*)    MCV 103.7 (*)    MCH 35.9 (*)    All other components within normal limits  GLUCOSE, CAPILLARY - Abnormal; Notable for the following components:   Glucose-Capillary 139 (*)    All other components within normal limits  URINALYSIS, COMPLETE (UACMP) WITH MICROSCOPIC  URINE DRUG SCREEN, QUALITATIVE ([facility] ONLY)  ETHANOL  CBG MONITORING, ED    RADIOLOGY Images were viewed by me  CT head IMPRESSION: Normal head CT. ____________________________________________   DIFFERENTIAL DIAGNOSIS   Alcohol use disorder, dehydration, electrolyte abnormality, anemia, CVA, TIA  FINAL ASSESSMENT AND PLAN  Weakness   Plan: The patient had presented for weakness. Patient's labs did reveal an elevated MCV likely indicating alcohol use disorder.  Urine drug screen was also positive for benzodiazepines.  He does admit to taking 1 of his friends Xanax is on occasion.  Patient's imaging does not reveal any acute process.  I think his symptoms are likely related to alcohol and occasional benzodiazepine use.  I have spoken with his wife and we will try a Librium to prevent withdrawal at home so that he can detox.  He is cleared for outpatient  follow-up.   Laurence Aly, MD    Note: This note was generated in part or whole with voice recognition software. Voice recognition is usually quite accurate but there are transcription errors that can and very often do occur. I apologize for any typographical errors that were not detected and corrected.     Earleen Newport, MD 10/12/18 [phone]

## 2018-10-12 NOTE — ED Triage Notes (Signed)
Pt arrived via POV with reports of weakness since Friday, pt also states he does not remember much of yesterday. 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   Pt denies any N/V/D.  Pt reports other people keep telling him that he is "off" and is slurring his speech.  No slurred speech on arrival. Pt ambulatory in triage without difficulty.

## 2019-09-17 ENCOUNTER — Inpatient Hospital Stay
Admission: EM | Admit: 2019-09-17 | Discharge: 2019-09-19 | DRG: 872 | Disposition: A | Discharge status: Home / Self Care | Payer: Self-pay | Attending: Internal Medicine | Admitting: Internal Medicine

## 2019-09-17 ENCOUNTER — Emergency Department: Payer: Self-pay

## 2019-09-17 ENCOUNTER — Encounter: Payer: Self-pay | Admitting: Emergency Medicine

## 2019-09-17 ENCOUNTER — Other Ambulatory Visit: Payer: Self-pay

## 2019-09-17 DIAGNOSIS — E611 Iron deficiency: Secondary | ICD-10-CM | POA: Diagnosis present

## 2019-09-17 DIAGNOSIS — R6520 Severe sepsis without septic shock: Secondary | ICD-10-CM | POA: Diagnosis present

## 2019-09-17 DIAGNOSIS — R161 Splenomegaly, not elsewhere classified: Secondary | ICD-10-CM | POA: Diagnosis present

## 2019-09-17 DIAGNOSIS — I472 Ventricular tachycardia: Secondary | ICD-10-CM | POA: Diagnosis present

## 2019-09-17 DIAGNOSIS — D61818 Other pancytopenia: Secondary | ICD-10-CM | POA: Diagnosis present

## 2019-09-17 DIAGNOSIS — Z79899 Other long term (current) drug therapy: Secondary | ICD-10-CM

## 2019-09-17 DIAGNOSIS — E538 Deficiency of other specified B group vitamins: Secondary | ICD-10-CM | POA: Diagnosis present

## 2019-09-17 DIAGNOSIS — Z20822 Contact with and (suspected) exposure to covid-19: Secondary | ICD-10-CM | POA: Diagnosis present

## 2019-09-17 DIAGNOSIS — E639 Nutritional deficiency, unspecified: Secondary | ICD-10-CM | POA: Diagnosis present

## 2019-09-17 DIAGNOSIS — N10 Acute tubulo-interstitial nephritis: Secondary | ICD-10-CM | POA: Diagnosis present

## 2019-09-17 DIAGNOSIS — K7010 Alcoholic hepatitis without ascites: Secondary | ICD-10-CM | POA: Diagnosis present

## 2019-09-17 DIAGNOSIS — K700 Alcoholic fatty liver: Secondary | ICD-10-CM | POA: Diagnosis present

## 2019-09-17 DIAGNOSIS — R739 Hyperglycemia, unspecified: Secondary | ICD-10-CM | POA: Diagnosis present

## 2019-09-17 DIAGNOSIS — K769 Liver disease, unspecified: Secondary | ICD-10-CM

## 2019-09-17 DIAGNOSIS — D709 Neutropenia, unspecified: Secondary | ICD-10-CM | POA: Diagnosis present

## 2019-09-17 DIAGNOSIS — R5081 Fever presenting with conditions classified elsewhere: Secondary | ICD-10-CM | POA: Diagnosis not present

## 2019-09-17 DIAGNOSIS — M109 Gout, unspecified: Secondary | ICD-10-CM | POA: Diagnosis present

## 2019-09-17 DIAGNOSIS — N390 Urinary tract infection, site not specified: Secondary | ICD-10-CM

## 2019-09-17 DIAGNOSIS — F1010 Alcohol abuse, uncomplicated: Secondary | ICD-10-CM | POA: Diagnosis present

## 2019-09-17 DIAGNOSIS — I10 Essential (primary) hypertension: Secondary | ICD-10-CM | POA: Diagnosis present

## 2019-09-17 DIAGNOSIS — E876 Hypokalemia: Secondary | ICD-10-CM | POA: Diagnosis present

## 2019-09-17 DIAGNOSIS — A419 Sepsis, unspecified organism: Principal | ICD-10-CM | POA: Diagnosis present

## 2019-09-17 DIAGNOSIS — D509 Iron deficiency anemia, unspecified: Secondary | ICD-10-CM | POA: Diagnosis present

## 2019-09-17 DIAGNOSIS — Z87442 Personal history of urinary calculi: Secondary | ICD-10-CM

## 2019-09-17 DIAGNOSIS — R6510 Systemic inflammatory response syndrome (SIRS) of non-infectious origin without acute organ dysfunction: Secondary | ICD-10-CM

## 2019-09-17 DIAGNOSIS — N3090 Cystitis, unspecified without hematuria: Secondary | ICD-10-CM | POA: Diagnosis present

## 2019-09-17 LAB — CBC WITH DIFFERENTIAL/PLATELET
Abs Immature Granulocytes: 0.01 10*3/uL (ref 0.00–0.07)
Basophils Absolute: 0 10*3/uL (ref 0.0–0.1)
Basophils Relative: 0 %
Eosinophils Absolute: 0 10*3/uL (ref 0.0–0.5)
Eosinophils Relative: 0 %
HCT: 34.4 % — ABNORMAL LOW (ref 39.0–52.0)
Hemoglobin: 12.5 g/dL — ABNORMAL LOW (ref 13.0–17.0)
Immature Granulocytes: 0 %
Lymphocytes Relative: 22 %
Lymphs Abs: 0.5 10*3/uL — ABNORMAL LOW (ref 0.7–4.0)
MCH: 37.9 pg — ABNORMAL HIGH (ref 26.0–34.0)
MCHC: 36.3 g/dL — ABNORMAL HIGH (ref 30.0–36.0)
MCV: 104.2 fL — ABNORMAL HIGH (ref 80.0–100.0)
Monocytes Absolute: 0.1 10*3/uL (ref 0.1–1.0)
Monocytes Relative: 6 %
Neutro Abs: 1.7 10*3/uL (ref 1.7–7.7)
Neutrophils Relative %: 72 %
Platelets: 124 10*3/uL — ABNORMAL LOW (ref 150–400)
RBC: 3.3 MIL/uL — ABNORMAL LOW (ref 4.22–5.81)
RDW: 14.3 % (ref 11.5–15.5)
WBC: 2.3 10*3/uL — ABNORMAL LOW (ref 4.0–10.5)
nRBC: 0 % (ref 0.0–0.2)

## 2019-09-17 LAB — URINALYSIS, COMPLETE (UACMP) WITH MICROSCOPIC
Bilirubin Urine: NEGATIVE
Glucose, UA: NEGATIVE mg/dL
Hgb urine dipstick: NEGATIVE
Ketones, ur: NEGATIVE mg/dL
Nitrite: NEGATIVE
Protein, ur: 30 mg/dL — AB
Specific Gravity, Urine: 1.011 (ref 1.005–1.030)
Squamous Epithelial / HPF: NONE SEEN (ref 0–5)
WBC, UA: >50 WBC/hpf — ABNORMAL HIGH (ref 0–5)
pH: 9 — ABNORMAL HIGH (ref 5.0–8.0)

## 2019-09-17 LAB — IRON AND TIBC
Iron: 35 ug/dL — ABNORMAL LOW (ref 45–182)
Saturation Ratios: 10 % — ABNORMAL LOW (ref 17.9–39.5)
TIBC: 360 ug/dL (ref 250–450)
UIBC: 325 ug/dL

## 2019-09-17 LAB — PROTIME-INR
INR: 1.2 (ref 0.8–1.2)
Prothrombin Time: 14.7 seconds (ref 11.4–15.2)

## 2019-09-17 LAB — BLOOD GAS, ARTERIAL
Acid-Base Excess: 1.5 mmol/L (ref 0.0–2.0)
Bicarbonate: 23.9 mmol/L (ref 20.0–28.0)
O2 Saturation: 99.4 %
pCO2 arterial: 30 mmHg — ABNORMAL LOW (ref 32.0–48.0)
pH, Arterial: 7.51 — ABNORMAL HIGH (ref 7.350–7.450)
pO2, Arterial: 111 mmHg — ABNORMAL HIGH (ref 83.0–108.0)

## 2019-09-17 LAB — FIBRINOGEN: Fibrinogen: 479 mg/dL — ABNORMAL HIGH (ref 210–475)

## 2019-09-17 LAB — LACTIC ACID, PLASMA
Lactic Acid, Venous: 3.5 mmol/L (ref 0.5–1.9)
Lactic Acid, Venous: 3.7 mmol/L (ref 0.5–1.9)
Lactic Acid, Venous: 3.8 mmol/L (ref 0.5–1.9)
Lactic Acid, Venous: 3.6 mmol/L (ref 0.5–1.9)

## 2019-09-17 LAB — RETICULOCYTES
Immature Retic Fract: 20.1 % — ABNORMAL HIGH (ref 2.3–15.9)
RBC.: 2.94 MIL/uL — ABNORMAL LOW (ref 4.22–5.81)
Retic Count, Absolute: 73.8 10*3/uL (ref 19.0–186.0)
Retic Ct Pct: 2.5 % (ref 0.4–3.1)

## 2019-09-17 LAB — COMPREHENSIVE METABOLIC PANEL
ALT: 82 U/L — ABNORMAL HIGH (ref 0–44)
AST: 51 U/L — ABNORMAL HIGH (ref 15–41)
Albumin: 4.2 g/dL (ref 3.5–5.0)
Alkaline Phosphatase: 70 U/L (ref 38–126)
Anion gap: 12 (ref 5–15)
BUN: 8 mg/dL (ref 6–20)
CO2: 26 mmol/L (ref 22–32)
Calcium: 9.2 mg/dL (ref 8.9–10.3)
Chloride: 96 mmol/L — ABNORMAL LOW (ref 98–111)
Creatinine, Ser: 0.94 mg/dL (ref 0.61–1.24)
GFR calc Af Amer: >60 mL/min (ref >60)
GFR calc non Af Amer: >60 mL/min (ref >60)
Glucose, Bld: 125 mg/dL — ABNORMAL HIGH (ref 70–99)
Potassium: 3.7 mmol/L (ref 3.5–5.1)
Sodium: 134 mmol/L — ABNORMAL LOW (ref 135–145)
Total Bilirubin: 2 mg/dL — ABNORMAL HIGH (ref 0.3–1.2)
Total Protein: 7.9 g/dL (ref 6.5–8.1)

## 2019-09-17 LAB — PROCALCITONIN: Procalcitonin: 0.31 ng/mL

## 2019-09-17 LAB — CBC
HCT: 30.2 % — ABNORMAL LOW (ref 39.0–52.0)
Hemoglobin: 11.1 g/dL — ABNORMAL LOW (ref 13.0–17.0)
MCH: 37.8 pg — ABNORMAL HIGH (ref 26.0–34.0)
MCHC: 36.8 g/dL — ABNORMAL HIGH (ref 30.0–36.0)
MCV: 102.7 fL — ABNORMAL HIGH (ref 80.0–100.0)
Platelets: 123 10*3/uL — ABNORMAL LOW (ref 150–400)
RBC: 2.94 MIL/uL — ABNORMAL LOW (ref 4.22–5.81)
RDW: 14.6 % (ref 11.5–15.5)
WBC: 2.3 10*3/uL — ABNORMAL LOW (ref 4.0–10.5)
nRBC: 0 % (ref 0.0–0.2)

## 2019-09-17 LAB — CREATININE, SERUM
Creatinine, Ser: 1.02 mg/dL (ref 0.61–1.24)
GFR calc Af Amer: >60 mL/min (ref >60)
GFR calc non Af Amer: >60 mL/min (ref >60)

## 2019-09-17 LAB — FOLATE: Folate: 2.3 ng/mL — ABNORMAL LOW (ref >5.9)

## 2019-09-17 LAB — FERRITIN: Ferritin: 370 ng/mL — ABNORMAL HIGH (ref 24–336)

## 2019-09-17 LAB — SARS CORONAVIRUS 2 BY RT PCR (HOSPITAL ORDER, PERFORMED IN ~~LOC~~ HOSPITAL LAB): SARS Coronavirus 2: NEGATIVE

## 2019-09-17 LAB — LACTATE DEHYDROGENASE: LDH: 178 U/L (ref 98–192)

## 2019-09-17 LAB — APTT: aPTT: 34 seconds (ref 24–36)

## 2019-09-17 LAB — TSH: TSH: 1.621 u[IU]/mL (ref 0.350–4.500)

## 2019-09-17 MED ORDER — SENNOSIDES-DOCUSATE SODIUM 8.6-50 MG PO TABS: 1.0000 | ORAL_TABLET | Freq: Every evening | ORAL | Status: DC | PRN

## 2019-09-17 MED ORDER — SODIUM CHLORIDE 0.9 % IV SOLN: Freq: Once | INTRAVENOUS | Status: AC

## 2019-09-17 MED ORDER — ACETAMINOPHEN 325 MG PO TABS
650.0000 mg | ORAL_TABLET | Freq: Four times a day (QID) | ORAL | Status: DC | PRN
  Administered 2019-09-18: 650 mg via ORAL
  Filled 2019-09-17: qty 2

## 2019-09-17 MED ORDER — ONDANSETRON HCL 4 MG/2ML IJ SOLN: 4.0000 mg | Freq: Four times a day (QID) | INTRAMUSCULAR | Status: DC | PRN

## 2019-09-17 MED ORDER — HEPARIN SODIUM (PORCINE) 5000 UNIT/ML IJ SOLN
5000.0000 [IU] | Freq: Three times a day (TID) | INTRAMUSCULAR | Status: DC
  Administered 2019-09-17 – 2019-09-19 (×5): 5000 [IU] via SUBCUTANEOUS
  Filled 2019-09-17 (×5): qty 1

## 2019-09-17 MED ORDER — SODIUM CHLORIDE 0.9 % IV SOLN
2.0000 g | INTRAVENOUS | Status: DC
  Administered 2019-09-18: 2 g via INTRAVENOUS
  Filled 2019-09-17: qty 2
  Filled 2019-09-17: qty 20

## 2019-09-17 MED ORDER — ACETAMINOPHEN 650 MG RE SUPP: 650.0000 mg | Freq: Four times a day (QID) | RECTAL | Status: DC | PRN

## 2019-09-17 MED ORDER — ONDANSETRON HCL 4 MG PO TABS: 4.0000 mg | ORAL_TABLET | Freq: Four times a day (QID) | ORAL | Status: DC | PRN

## 2019-09-17 MED ORDER — SODIUM CHLORIDE 0.9 % IV SOLN
2.0000 g | Freq: Once | INTRAVENOUS | Status: AC
  Administered 2019-09-17: 2 g via INTRAVENOUS
  Filled 2019-09-17: qty 20

## 2019-09-17 MED ORDER — SODIUM CHLORIDE 0.9 % IV SOLN: INTRAVENOUS | Status: AC

## 2019-09-17 MED ORDER — HYDROCODONE-ACETAMINOPHEN 5-325 MG PO TABS: 1.0000 | ORAL_TABLET | ORAL | Status: DC | PRN

## 2019-09-17 NOTE — H&P (Signed)
History and Physical    Curtis Erickson GYI:[id number] DOB: 01/19/1991 DOA: 09/17/2019  PCP: Patient, [name]  Patient coming from: home  I have personally briefly reviewed patient's old medical records in [facility]  Chief Complaint: fever, dysuria x 10 days   HPI: Curtis Erickson is a 29 y.o. male with medical history significant of  HTN, kidney stones, gout who presents to ed with fever, dysuria x 10 days  He notes no associated symptoms of n/v/d/abdominal pain/ chest pain/ cough/muscle aches/ uri sxs.   ED Course:  Vitals: BP: (!) 146/97  Pulse: (!) 117  Resp: 18  Temp: (!) 101.9 F (38.8 C)  SpO2: 97%   Labs  Wbc 2.3 ( prior 6.9), hgb 12.5 down from 14.4,  MCV 104, plt 124 UA:wbc>50,rare bacteria Na 134,bicarb 26,  ast 51/alt82/ tibli 2 Lactic 3.5,3.7 COVID negative Cxr: NAD CT abd: hepatic steatosis, splenomegaly, mild wall thickening of left renal pelvis and proximal left ureter concern for infection  Review of Systems: As per HPI otherwise 10 point review of systems negative.   Past Medical History:  Diagnosis Date  . Hypertension   . Kidney stone     Past Surgical History:  Procedure Laterality Date  . APPENDECTOMY    . KIDNEY STONE SURGERY       reports that he has never smoked. He has never used smokeless tobacco. He reports current alcohol use. He reports previous drug use.  No Known Allergies  History reviewed. No pertinent family history.  Prior to Admission medications   Medication Sig Start Date End Date Taking? Authorizing Provider  colchicine 0.6 MG tablet Take 1 tablet (0.6 mg total) by mouth daily. Patient not taking: Reported on 09/17/2019 10/03/18   Felecia Shelling, DPM  indomethacin (INDOCIN) 50 MG capsule Take 50 mg by mouth 3 (three) times daily. Patient not taking: Reported on 09/17/2019 06/23/19   [provider]    Physical Exam: Vitals:   09/17/19 1909 09/17/19 1910 09/17/19 1911 09/17/19 1912  BP:      Pulse: (!)  123 (!) 117 (!) 119 (!) 117  Resp:      Temp:      TempSrc:      SpO2:      Weight:      Height:        Constitutional: NAD, calm, comfortable Vitals:   09/17/19 1909 09/17/19 1910 09/17/19 1911 09/17/19 1912  BP:      Pulse: (!) 123 (!) 117 (!) 119 (!) 117  Resp:      Temp:      TempSrc:      SpO2:      Weight:      Height:       Eyes: PERRL, lids and conjunctivae normal ENMT: Mucous membranes are moist. Posterior pharynx clear of any exudate or lesions.Normal dentition.  Neck: normal, supple, no masses, no thyromegaly Respiratory: clear to auscultation bilaterally, no wheezing, no crackles. Normal respiratory effort. No accessory muscle use.  Cardiovascular: Regular rate and rhythm, no murmurs / rubs / gallops. No extremity edema. 2+ pedal pulses. No carotid bruits.  Abdomen: no tenderness, no masses palpated. No hepatosplenomegaly. Bowel sounds positive.  Musculoskeletal: no clubbing / cyanosis. No joint deformity upper and lower extremities. Good ROM, no contractures. Normal muscle tone.  Skin: no rashes, lesions, ulcers. No induration Neurologic: CN 2-12 grossly intact. Sensation intact, DTR normal. Strength 5/5 in all 4.  Psychiatric: Normal judgment and insight. Alert  and oriented x 3. Normal mood.    Labs on Admission: I have personally reviewed following labs and imaging studies  CBC: Recent Labs  Lab 09/17/19 1622  WBC 2.3*  NEUTROABS 1.7  HGB 12.5*  HCT 34.4*  MCV 104.2*  PLT 124*   Basic Metabolic Panel: Recent Labs  Lab 09/17/19 1622  NA 134*  K 3.7  CL 96*  CO2 26  GLUCOSE 125*  BUN 8  CREATININE 0.94  CALCIUM 9.2   GFR: Estimated Creatinine Clearance: 127.9 mL/min (by C-G formula based on SCr of 0.94 mg/dL). Liver Function Tests: Recent Labs  Lab 09/17/19 1622  AST 51*  ALT 82*  ALKPHOS 70  BILITOT 2.0*  PROT 7.9  ALBUMIN 4.2   No results for input(s): LIPASE, AMYLASE in the last 168 hours. No results for input(s): AMMONIA in the  last 168 hours. Coagulation Profile: No results for input(s): INR, PROTIME in the last 168 hours. Cardiac Enzymes: No results for input(s): CKTOTAL, CKMB, CKMBINDEX, TROPONINI in the last 168 hours. BNP (last 3 results) No results for input(s): PROBNP in the last 8760 hours. HbA1C: No results for input(s): HGBA1C in the last 72 hours. CBG: No results for input(s): GLUCAP in the last 168 hours. Lipid Profile: No results for input(s): CHOL, HDL, LDLCALC, TRIG, CHOLHDL, LDLDIRECT in the last 72 hours. Thyroid Function Tests: No results for input(s): TSH, T4TOTAL, FREET4, T3FREE, THYROIDAB in the last 72 hours. Anemia Panel: No results for input(s): VITAMINB12, FOLATE, FERRITIN, TIBC, IRON, RETICCTPCT in the last 72 hours. Urine analysis:    Component Value Date/Time   COLORURINE YELLOW (A) 09/17/2019 1622   APPEARANCEUR CLEAR (A) 09/17/2019 1622   LABSPEC 1.011 09/17/2019 1622   PHURINE 9.0 (H) 09/17/2019 1622   GLUCOSEU NEGATIVE 09/17/2019 1622   HGBUR NEGATIVE 09/17/2019 1622   BILIRUBINUR NEGATIVE 09/17/2019 1622   KETONESUR NEGATIVE 09/17/2019 1622   PROTEINUR 30 (A) 09/17/2019 1622   NITRITE NEGATIVE 09/17/2019 1622   LEUKOCYTESUR SMALL (A) 09/17/2019 1622    Radiological Exams on Admission: DG Chest 2 View  Result Date: 09/17/2019 CLINICAL DATA:  Urosepsis, fever EXAM: CHEST - 2 VIEW COMPARISON:  None. FINDINGS: The heart size and mediastinal contours are within normal limits. Both lungs are clear. The visualized skeletal structures are unremarkable. IMPRESSION: No active cardiopulmonary disease. Electronically Signed   By: Sharlet Salina M.D.   On: 09/17/2019 17:13   CT Renal Stone Study  Result Date: 09/17/2019 CLINICAL DATA:  29 year old male with acute abdominal and flank pain with fever for several days. EXAM: CT ABDOMEN AND PELVIS WITHOUT CONTRAST TECHNIQUE: Multidetector CT imaging of the abdomen and pelvis was performed following the standard protocol without IV  contrast. COMPARISON:  11/23/2014 CT and prior studies FINDINGS: Please note that parenchymal abnormalities may be missed without intravenous contrast. Lower chest: No acute abnormality Hepatobiliary: Hepatic steatosis identified without focal hepatic abnormality. No gallbladder abnormalities are identified. There is no evidence of biliary dilatation. Pancreas: Unremarkable Spleen: Splenomegaly is again identified, slightly increased in size, now with a splenic volume of 800 cc. No focal splenic abnormalities are noted. Adrenals/Urinary Tract: Mild wall thickening of the LEFT renal pelvis and proximal LEFT ureter noted suspicious for infection. No other definite renal abnormalities are noted. There is no evidence of hydronephrosis or urinary calculi. Stomach/Bowel: Stomach is within normal limits. No evidence of bowel wall thickening, distention, or inflammatory changes. Vascular/Lymphatic: No significant vascular findings are present. No enlarged abdominal or pelvic lymph nodes. Reproductive: Prostate is  unremarkable. Other: No ascites, focal collection or pneumoperitoneum. Musculoskeletal: No acute or suspicious bony abnormality noted. IMPRESSION: 1. Mild wall thickening of the LEFT renal pelvis and proximal LEFT ureter suspicious for infection. No evidence of hydronephrosis or urinary calculi. 2. Hepatic steatosis. 3. Splenomegaly, slightly increased in size since 2016, now with a splenic volume of 800 CC. Electronically Signed   By: Margarette Canada M.D.   On: 09/17/2019 18:24    EKG: Independently reviewed. Sinus tachycardia ,no acute st-t wave changes  Assessment/Plan  Sepsis  Due to complicated uti/ early pyelonephritis  - placed on sepsis protocol s/p 2 L ivfs in ed  - continue with ceftriaxone iv 2 gram  - f/u on urine/blood cultures  -rising lactate , continue with ivfs, and cycle lactate   ETOH abuse -binge drinking  -last drink 5 days ago /denies history of w/d  Leukopenia -due to infection  vs other etiology  -monitor counts   Concern of etoh related liver disease/early cirrhosis -Elevated lfts( s/p recent binge drinking), leukopenia, anemia, borderline platelets, splenomegaly  -continue to cycle lfts  -check  Hep panel to be complete -check inr  -dedicated RUQ u/s  Anemia  - large MCV  -check b12, folate, tsh   Gout -no acute flare   Hx of HTN  -not currently on medications  - blood pressure in hypertensive range,continue to monitor   Hx of Renal stones -ct negative for stones      DVT prophylaxis:heparin Code Status:full Family Communication: n/a Disposition Plan: 2-3 days Consults called: n/a Admission status: inpatient   Clance Boll MD [facility]  If 7PM-7AM, please contact night-coverage [email or web address] Password [facility]  09/17/2019, 7:19 PM

## 2019-09-17 NOTE — ED Provider Notes (Signed)
ER Provider Note       Time seen: 5:42 PM    I have reviewed the vital signs and the nursing notes.  HISTORY   Chief Complaint Fever and Dysuria    HPI Curtis Erickson is a 29 y.o. male with a history of hypertension, kidney stones who presents today for urinary symptoms for the past 7 to 10 days and fever today.  Patient went to [facility] clinic and was sent to the ER for fever and abnormal vital signs.  Patient states his urinary symptoms improved for a few days and then the fever started.  Patient states he is never had a urinary tract infection before.  Past Medical History:  Diagnosis Date  . Hypertension   . Kidney stone     Past Surgical History:  Procedure Laterality Date  . APPENDECTOMY    . KIDNEY STONE SURGERY      Allergies Patient has no known allergies.   Review of Systems Constitutional: Positive for fever Cardiovascular: Negative for chest pain. Respiratory: Negative for shortness of breath. Genitourinary: Positive for dysuria Gastrointestinal: Negative for abdominal pain, vomiting and diarrhea. Musculoskeletal: Negative for back pain. Skin: Negative for rash. Neurological: Negative for headaches, focal weakness or numbness.  All systems negative/normal/unremarkable except as stated in the HPI  ____________________________________________   PHYSICAL EXAM:  VITAL SIGNS: Vitals:   09/17/19 1614  BP: (!) 146/97  Pulse: (!) 117  Resp: 18  Temp: (!) 101.9 F (38.8 C)  SpO2: 97%    Constitutional: Alert and oriented. Well appearing and in no distress. Eyes: Conjunctivae are normal. Normal extraocular movements. ENT      Head: Normocephalic and atraumatic.      Nose: No congestion/rhinnorhea.      Mouth/Throat: Mucous membranes are moist.      Neck: No stridor. Cardiovascular: Rapid rate, regular rhythm. No murmurs, rubs, or gallops. Respiratory: Normal respiratory effort without tachypnea nor retractions. Breath sounds are clear and  equal bilaterally. No wheezes/rales/rhonchi. Gastrointestinal: Soft and nontender. Normal bowel sounds Musculoskeletal: Nontender with normal range of motion in extremities. No lower extremity tenderness nor edema. Neurologic:  Normal speech and language. No gross focal neurologic deficits are appreciated.  Skin:  Skin is warm, dry and intact. No rash noted. Psychiatric: Speech and behavior are normal.  ____________________________________________  EKG: Interpreted by me.  Sinus tachycardia with rate of 112 bpm, normal PR interval, normal QRS, normal QT  ____________________________________________   LABS (pertinent positives/negatives)  Labs Reviewed  LACTIC ACID, PLASMA - Abnormal; Notable for the following components:      Result Value   Lactic Acid, Venous 3.5 (*)    All other components within normal limits  LACTIC ACID, PLASMA - Abnormal; Notable for the following components:   Lactic Acid, Venous 3.7 (*)    All other components within normal limits  COMPREHENSIVE METABOLIC PANEL - Abnormal; Notable for the following components:   Sodium 134 (*)    Chloride 96 (*)    Glucose, Bld 125 (*)    AST 51 (*)    ALT 82 (*)    Total Bilirubin 2.0 (*)    All other components within normal limits  CBC WITH DIFFERENTIAL/PLATELET - Abnormal; Notable for the following components:   WBC 2.3 (*)    RBC 3.30 (*)    Hemoglobin 12.5 (*)    HCT 34.4 (*)    MCV 104.2 (*)    MCH 37.9 (*)    MCHC 36.3 (*)    Platelets  124 (*)    Lymphs Abs 0.5 (*)    All other components within normal limits  URINALYSIS, COMPLETE (UACMP) WITH MICROSCOPIC - Abnormal; Notable for the following components:   Color, Urine YELLOW (*)    APPearance CLEAR (*)    pH 9.0 (*)    Protein, ur 30 (*)    Leukocytes,Ua SMALL (*)    WBC, UA >50 (*)    Bacteria, UA RARE (*)    All other components within normal limits  URINE CULTURE  CULTURE, BLOOD (ROUTINE X 2)  CULTURE, BLOOD (ROUTINE X 2)  SARS CORONAVIRUS 2 BY  RT PCR (HOSPITAL ORDER, PERFORMED IN [place] [facility])   CRITICAL CARE Performed by: Ulice Dash   Total critical care time: 30 minutes  Critical care time was exclusive of separately billable procedures and treating other patients.  Critical care was necessary to treat or prevent imminent or life-threatening deterioration.  Critical care was time spent personally by me on the following activities: development of treatment plan with patient and/or surrogate as well as nursing, discussions with consultants, evaluation of patient's response to treatment, examination of patient, obtaining history from patient or surrogate, ordering and performing treatments and interventions, ordering and review of laboratory studies, ordering and review of radiographic studies, pulse oximetry and re-evaluation of patient's condition.  RADIOLOGY Chest x-ray is unremarkable IMPRESSION: 1. Mild wall thickening of the LEFT renal pelvis and proximal LEFT ureter suspicious for infection. No evidence of hydronephrosis or urinary calculi. 2. Hepatic steatosis. 3. Splenomegaly, slightly increased in size since 2016, now with a splenic volume of 800 CC.   DIFFERENTIAL DIAGNOSIS  UTI, pyelonephritis, renal colic, sepsis  ASSESSMENT AND PLAN  Cystitis, pyelonephritis, splenomegaly   Plan: The patient had presented for fever and UTI symptoms. Patient's labs look surprisingly poor with pancytopenia, elevated lactic acid level of 3.5 and evidence of urinary tract infection.  CT was worrisome for a sending urinary tract infection, there is also evidence of splenomegaly of uncertain etiology.  He has received fluids as well as broad-spectrum antibiotics and we have sent cultures.  I will discuss with the hospitalist for admission.  Daryel November MD    Note: This note was generated in part or whole with voice recognition software. Voice recognition is usually quite accurate but there are  transcription errors that can and very often do occur. I apologize for any typographical errors that were not detected and corrected.     Emily Filbert, MD 09/17/19 1840

## 2019-09-17 NOTE — Progress Notes (Signed)
CODE SEPSIS - PHARMACY COMMUNICATION  **Broad Spectrum Antibiotics should be administered within 1 hour of Sepsis diagnosis**  Time Code Sepsis Called/Page Received: 1921  Antibiotics Ordered: Rocephin  Time of 1st antibiotic administration: 1912  Additional action taken by pharmacy: none  If necessary, Name of Provider/Nurse Contacted: n/a    Bettey Costa ,PharmD Clinical Pharmacist  09/17/2019  7:21 PM

## 2019-09-17 NOTE — ED Notes (Signed)
Report off to butch rn  

## 2019-09-17 NOTE — ED Notes (Signed)
Pt ate dinner from [employer].  Family with pt.  Iv fluids infusing.  Pt alert.

## 2019-09-17 NOTE — ED Notes (Signed)
Pt has fever, dysuria.  Sx for 10 days.  Pt has pressure and pain with urination.  md at bedside on arrival to room   Iv started and labs sent again. Pt alert.

## 2019-09-17 NOTE — ED Triage Notes (Signed)
Pt from [facility] with urinary symptoms x 7-10 days and fever today. Pt went to [facility] and was sent to ED due to fever and vital signs. Pt states his urinary symptoms improved for a few days and then the fever set in. Pt alert & oriented, denies pain. NAD Noted.

## 2019-09-18 ENCOUNTER — Inpatient Hospital Stay: Payer: Self-pay

## 2019-09-18 DIAGNOSIS — D519 Vitamin B12 deficiency anemia, unspecified: Secondary | ICD-10-CM

## 2019-09-18 DIAGNOSIS — N10 Acute tubulo-interstitial nephritis: Secondary | ICD-10-CM

## 2019-09-18 DIAGNOSIS — D508 Other iron deficiency anemias: Secondary | ICD-10-CM

## 2019-09-18 DIAGNOSIS — D61818 Other pancytopenia: Secondary | ICD-10-CM

## 2019-09-18 DIAGNOSIS — E538 Deficiency of other specified B group vitamins: Secondary | ICD-10-CM

## 2019-09-18 LAB — COMPREHENSIVE METABOLIC PANEL
ALT: 64 U/L — ABNORMAL HIGH (ref 0–44)
ALT: 64 U/L — ABNORMAL HIGH (ref 0–44)
AST: 41 U/L (ref 15–41)
AST: 43 U/L — ABNORMAL HIGH (ref 15–41)
Albumin: 3.2 g/dL — ABNORMAL LOW (ref 3.5–5.0)
Albumin: 3.3 g/dL — ABNORMAL LOW (ref 3.5–5.0)
Alkaline Phosphatase: 57 U/L (ref 38–126)
Alkaline Phosphatase: 58 U/L (ref 38–126)
Anion gap: 7 (ref 5–15)
Anion gap: 8 (ref 5–15)
BUN: 8 mg/dL (ref 6–20)
BUN: 8 mg/dL (ref 6–20)
CO2: 28 mmol/L (ref 22–32)
CO2: 24 mmol/L (ref 22–32)
Calcium: 8.3 mg/dL — ABNORMAL LOW (ref 8.9–10.3)
Calcium: 8.1 mg/dL — ABNORMAL LOW (ref 8.9–10.3)
Chloride: 102 mmol/L (ref 98–111)
Chloride: 106 mmol/L (ref 98–111)
Creatinine, Ser: 0.87 mg/dL (ref 0.61–1.24)
Creatinine, Ser: 0.77 mg/dL (ref 0.61–1.24)
GFR calc Af Amer: >60 mL/min (ref >60)
GFR calc Af Amer: >60 mL/min (ref >60)
GFR calc non Af Amer: >60 mL/min (ref >60)
GFR calc non Af Amer: >60 mL/min (ref >60)
Glucose, Bld: 119 mg/dL — ABNORMAL HIGH (ref 70–99)
Glucose, Bld: 119 mg/dL — ABNORMAL HIGH (ref 70–99)
Potassium: 3.5 mmol/L (ref 3.5–5.1)
Potassium: 4.1 mmol/L (ref 3.5–5.1)
Sodium: 137 mmol/L (ref 135–145)
Sodium: 138 mmol/L (ref 135–145)
Total Bilirubin: 1.1 mg/dL (ref 0.3–1.2)
Total Bilirubin: 1 mg/dL (ref 0.3–1.2)
Total Protein: 6.3 g/dL — ABNORMAL LOW (ref 6.5–8.1)
Total Protein: 6.1 g/dL — ABNORMAL LOW (ref 6.5–8.1)

## 2019-09-18 LAB — CBC
HCT: 34 % — ABNORMAL LOW (ref 39.0–52.0)
HCT: 29.3 % — ABNORMAL LOW (ref 39.0–52.0)
Hemoglobin: 12.2 g/dL — ABNORMAL LOW (ref 13.0–17.0)
Hemoglobin: 10.4 g/dL — ABNORMAL LOW (ref 13.0–17.0)
MCH: 38 pg — ABNORMAL HIGH (ref 26.0–34.0)
MCH: 37.8 pg — ABNORMAL HIGH (ref 26.0–34.0)
MCHC: 35.9 g/dL (ref 30.0–36.0)
MCHC: 35.5 g/dL (ref 30.0–36.0)
MCV: 105.9 fL — ABNORMAL HIGH (ref 80.0–100.0)
MCV: 106.5 fL — ABNORMAL HIGH (ref 80.0–100.0)
Platelets: 101 10*3/uL — ABNORMAL LOW (ref 150–400)
Platelets: 116 10*3/uL — ABNORMAL LOW (ref 150–400)
RBC: 3.21 MIL/uL — ABNORMAL LOW (ref 4.22–5.81)
RBC: 2.75 MIL/uL — ABNORMAL LOW (ref 4.22–5.81)
RDW: 14.5 % (ref 11.5–15.5)
RDW: 14.5 % (ref 11.5–15.5)
WBC: 1.5 10*3/uL — ABNORMAL LOW (ref 4.0–10.5)
WBC: 1.7 10*3/uL — ABNORMAL LOW (ref 4.0–10.5)
nRBC: 0 % (ref 0.0–0.2)
nRBC: 0 % (ref 0.0–0.2)

## 2019-09-18 LAB — HEPATITIS PANEL, ACUTE
HCV Ab: NONREACTIVE
Hep A IgM: NONREACTIVE
Hep B C IgM: NONREACTIVE
Hepatitis B Surface Ag: NONREACTIVE

## 2019-09-18 LAB — HEMOGLOBIN A1C
Hgb A1c MFr Bld: 4.9 % (ref 4.8–5.6)
Mean Plasma Glucose: 93.93 mg/dL

## 2019-09-18 LAB — HIV ANTIBODY (ROUTINE TESTING W REFLEX): HIV Screen 4th Generation wRfx: NONREACTIVE

## 2019-09-18 LAB — PHOSPHORUS: Phosphorus: 3.2 mg/dL (ref 2.5–4.6)

## 2019-09-18 LAB — LACTIC ACID, PLASMA
Lactic Acid, Venous: 3 mmol/L (ref 0.5–1.9)
Lactic Acid, Venous: 1.3 mmol/L (ref 0.5–1.9)

## 2019-09-18 LAB — MAGNESIUM
Magnesium: 1.4 mg/dL — ABNORMAL LOW (ref 1.7–2.4)
Magnesium: 2.4 mg/dL (ref 1.7–2.4)

## 2019-09-18 LAB — CHLAMYDIA/NGC RT PCR (ARMC ONLY)
Chlamydia Tr: NOT DETECTED
N gonorrhoeae: NOT DETECTED

## 2019-09-18 LAB — VITAMIN B12: Vitamin B-12: 143 pg/mL — ABNORMAL LOW (ref 180–914)

## 2019-09-18 MED ORDER — LORAZEPAM 2 MG/ML IJ SOLN: 1.0000 mg | INTRAMUSCULAR | Status: DC | PRN

## 2019-09-18 MED ORDER — THIAMINE HCL 100 MG/ML IJ SOLN: 100.0000 mg | Freq: Every day | INTRAMUSCULAR | Status: DC

## 2019-09-18 MED ORDER — FERROUS SULFATE 325 (65 FE) MG PO TABS
325.0000 mg | ORAL_TABLET | Freq: Two times a day (BID) | ORAL | Status: DC
  Administered 2019-09-18 – 2019-09-19 (×2): 325 mg via ORAL
  Filled 2019-09-18 (×2): qty 1

## 2019-09-18 MED ORDER — MAGNESIUM SULFATE 2 GM/50ML IV SOLN
2.0000 g | INTRAVENOUS | Status: AC
  Administered 2019-09-18 (×2): 2 g via INTRAVENOUS
  Filled 2019-09-18 (×2): qty 50

## 2019-09-18 MED ORDER — FOLIC ACID 1 MG PO TABS
1.0000 mg | ORAL_TABLET | Freq: Every day | ORAL | Status: DC
  Administered 2019-09-18 – 2019-09-19 (×2): 1 mg via ORAL
  Filled 2019-09-18 (×2): qty 1

## 2019-09-18 MED ORDER — VITAMIN B-12 1000 MCG PO TABS
1000.0000 ug | ORAL_TABLET | Freq: Every day | ORAL | Status: DC
  Administered 2019-09-19: 1000 ug via ORAL
  Filled 2019-09-18: qty 1

## 2019-09-18 MED ORDER — SODIUM CHLORIDE 0.9% FLUSH
3.0000 mL | Freq: Two times a day (BID) | INTRAVENOUS | Status: DC
  Administered 2019-09-18: 3 mL via INTRAVENOUS

## 2019-09-18 MED ORDER — THIAMINE HCL 100 MG PO TABS
100.0000 mg | ORAL_TABLET | Freq: Every day | ORAL | Status: DC
  Administered 2019-09-18 – 2019-09-19 (×2): 100 mg via ORAL
  Filled 2019-09-18 (×2): qty 1

## 2019-09-18 MED ORDER — ADULT MULTIVITAMIN W/MINERALS CH
1.0000 | ORAL_TABLET | Freq: Every day | ORAL | Status: DC
  Administered 2019-09-18 – 2019-09-19 (×2): 1 via ORAL
  Filled 2019-09-18 (×2): qty 1

## 2019-09-18 MED ORDER — MAGNESIUM SULFATE 4 GM/100ML IV SOLN: 4.0000 g | Freq: Once | INTRAVENOUS | Status: DC

## 2019-09-18 MED ORDER — LORAZEPAM 1 MG PO TABS: 1.0000 mg | ORAL_TABLET | ORAL | Status: DC | PRN

## 2019-09-18 MED ORDER — POTASSIUM CHLORIDE CRYS ER 20 MEQ PO TBCR
40.0000 meq | EXTENDED_RELEASE_TABLET | Freq: Once | ORAL | Status: AC
  Administered 2019-09-18: 40 meq via ORAL
  Filled 2019-09-18: qty 2

## 2019-09-18 MED ORDER — CYANOCOBALAMIN 1000 MCG/ML IJ SOLN
1000.0000 ug | Freq: Once | INTRAMUSCULAR | Status: AC
  Administered 2019-09-18: 1000 ug via INTRAMUSCULAR
  Filled 2019-09-18: qty 1

## 2019-09-18 MED ORDER — SODIUM CHLORIDE 0.9 % IV SOLN
INTRAVENOUS | Status: DC | PRN
  Administered 2019-09-18: 250 mL via INTRAVENOUS
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
## 2019-09-18 NOTE — Plan of Care (Signed)
  Problem: Education: Goal: Knowledge of General Education information will improve Description: Including pain rating scale, medication(s)/side effects and non-pharmacologic comfort measures Outcome: Progressing Note: Patient profile completed. Patient arrived with an elevated temperature. No complaints of pain. Patient has an elevated heart rate.

## 2019-09-18 NOTE — Progress Notes (Signed)
PROGRESS NOTE    BURNETT SPRAY  RSW:[id number] DOB: [birth year] DOA: 09/17/2019 PCP: Patient, [name]   Chief Complaint  Patient presents with  . Fever  . Dysuria    Brief Narrative: 29 year old male with history of hypertension, kidney stones and gout presented to the ED with dysuria and urinary urgency for almost 10 days.  Denied any abdominal pain, nausea or vomiting.  On the day of admission he started having fevers with chills.  In the ED he was septic with tachycardia, fever of 101.36F, neutropenia with WBC of 2.3, lactic acid of 3, transaminitis and UA positive for UTI.  CT renal study showing findings suggestive of left-sided pyelonephritis. Admitted for severe sepsis secondary to UTI with pyelonephritis.  Assessment & Plan:   Principal problem Severe sepsis without septic shock (HCC) Secondary to UTI with left-sided pyelonephritis.  Associated pancytopenia.  Empiric IV Rocephin and IV fluids.  Sepsis resolving.  Follow urine and blood culture.  Active problems Pancytopenia Possibly due to sepsis, alcoholic vs B12 marrow suppression.   Patient reports drinking 6-8 beers a week but was binge drinking heavily recently while he was at the beach. Mildly elevated fibrinogen but with normal LDH and INR.  Monitor closely.  Fatty liver with?  Alcoholic hepatitis LFTs improving in a.m. lab.  Follow abdominal ultrasound.  Monitor on CIWA.  No signs of alcohol withdrawal.  Counseled strongly on alcohol cessation.  Hepatitis panel negative.  Iron deficiency anemia, B12 and folic acid deficiency. Likely nutritional deficiency.  Given IM B12 x1.  add p.o. B12 and folate supplement.  Added iron supplement as well..  Hypokalemia/hypomagnesemia Replenish  Elevated blood glucose CBGs in 120s-130s.  Check A1c  DVT prophylaxis: Subcu Lovenox Code Status: Full code Family Communication: None Disposition:   Status is: Inpatient  Remains inpatient appropriate because:Inpatient level  of care appropriate due to severity of illness.  Resolving sepsis, pancytopenia.  Possible discharge home in 1-2 days if improving.   Dispo: The patient is from: Home              Anticipated d/c is to: Home              Anticipated d/c date is: 1 day              Patient currently is not medically stable to d/c.       Consultants:   None   Procedures: CT renal study, ultrasound abdomen   Antimicrobials: IV Rocephin   Subjective: Seen and examined.  Denies any dysuria, fevers or chills.  Objective: Vitals:   09/18/19 0204 09/18/19 0306 09/18/19 0726 09/18/19 1146  BP:  133/90 (!) 146/97 (!) 142/91  Pulse:  85 93 97  Resp:  18    Temp:  98.4 F (36.9 C) 99.1 F (37.3 C) 98.9 F (37.2 C)  TempSrc:  Oral Oral Oral  SpO2:  99% 97% 95%  Weight: 88.5 kg     Height: 5\' 8"  (1.727 m)       Intake/Output Summary (Last 24 hours) at 09/18/2019 1401 Last data filed at 09/18/2019 1014 Gross per 24 hour  Intake 1923.48 ml  Output 1350 ml  Net 573.48 ml   Filed Weights   09/17/19 1615 09/18/19 0204  Weight: 90.7 kg 88.5 kg    Examination:  General: Young male not in distress HEENT: Moist mucosa, supple neck Chest: Clear CVs: Normal S1-S2 GI: Soft, nondistended, nontender, no CVA tenderness Musculoskeletal: Warm, no edema  Data Reviewed: I  have personally reviewed following labs and imaging studies  CBC: Recent Labs  Lab 09/17/19 1622 09/17/19 1902 09/18/19 0046 09/18/19 0653  WBC 2.3* 2.3* 1.5* 1.7*  NEUTROABS 1.7  --   --   --   HGB 12.5* 11.1* 12.2* 10.4*  HCT 34.4* 30.2* 34.0* 29.3*  MCV 104.2* 102.7* 105.9* 106.5*  PLT 124* 123* 101* 116*    Basic Metabolic Panel: Recent Labs  Lab 09/17/19 1622 09/17/19 1902 09/18/19 0046 09/18/19 0653  NA 134*  --  137 138  K 3.7  --  3.5 4.1  CL 96*  --  102 106  CO2 26  --  28 24  GLUCOSE 125*  --  119* 119*  BUN 8  --  8 8  CREATININE 0.94 1.02 0.87 0.77  CALCIUM 9.2  --  8.3* 8.1*  MG  --   --   1.4* 2.4  PHOS  --   --  3.2  --     GFR: Estimated Creatinine Clearance: 148.6 mL/min (by C-G formula based on SCr of 0.77 mg/dL).  Liver Function Tests: Recent Labs  Lab 09/17/19 1622 09/18/19 0046 09/18/19 0653  AST 51* 41 43*  ALT 82* 64* 64*  ALKPHOS 70 57 58  BILITOT 2.0* 1.1 1.0  PROT 7.9 6.3* 6.1*  ALBUMIN 4.2 3.2* 3.3*    CBG: No results for input(s): GLUCAP in the last 168 hours.   Recent Results (from the past 240 hour(s))  Chlamydia/NGC rt PCR ([place] only)     Status: None   Collection Time: 09/17/19  4:22 PM   Specimen: Urine; Genital  Result Value Ref Range Status   Specimen source GC/Chlam URINE, RANDOM  Final   Chlamydia Tr NOT DETECTED NOT DETECTED Final   N gonorrhoeae NOT DETECTED NOT DETECTED Final    Comment: (NOTE) This CT/NG assay has not been evaluated in patients with a history of  hysterectomy. Performed at [facility], [address] [place], [place] [address]   Blood culture (routine x 2)     Status: None (Preliminary result)   Collection Time: 09/17/19  5:57 PM   Specimen: BLOOD  Result Value Ref Range Status   Specimen Description BLOOD RIGHT ANTECUBITAL  Final   Special Requests   Final    BOTTLES DRAWN AEROBIC AND ANAEROBIC Blood Culture adequate volume   Culture   Final    NO GROWTH < 24 HOURS Performed at [facility], [address] [place], [place] [address]    Report Status PENDING  Incomplete  Blood culture (routine x 2)     Status: None (Preliminary result)   Collection Time: 09/17/19  5:57 PM   Specimen: BLOOD  Result Value Ref Range Status   Specimen Description BLOOD LEFT ANTECUBITAL  Final   Special Requests   Final    BOTTLES DRAWN AEROBIC AND ANAEROBIC Blood Culture adequate volume   Culture   Final    NO GROWTH < 12 HOURS Performed at [facility], [address], [place], [place] [address]    Report Status PENDING  Incomplete  SARS Coronavirus 2 by RT PCR (hospital order,  performed in [place] hospital lab) Nasopharyngeal Nasopharyngeal Swab     Status: None   Collection Time: 09/17/19  5:57 PM   Specimen: Nasopharyngeal Swab  Result Value Ref Range Status   SARS Coronavirus 2 NEGATIVE NEGATIVE Final    Comment: (NOTE) SARS-CoV-2 target nucleic acids are NOT DETECTED.  The SARS-CoV-2 RNA is  generally detectable in upper and lower respiratory specimens during the acute phase of infection. The lowest concentration of SARS-CoV-2 viral copies this assay can detect is 250 copies / mL. A negative result does not preclude SARS-CoV-2 infection and should not be used as the sole basis for treatment or other patient management decisions.  A negative result may occur with improper specimen collection / handling, submission of specimen other than nasopharyngeal swab, presence of viral mutation(s) within the areas targeted by this assay, and inadequate number of viral copies (<250 copies / mL). A negative result must be combined with clinical observations, patient history, and epidemiological information.  Fact Sheet for Patients:   BoilerBrush.com.cy  Fact Sheet for Healthcare Providers: https://pope.com/  This test is not yet approved or  cleared by the [place] FDA and has been authorized for detection and/or diagnosis of SARS-CoV-2 by FDA under an Emergency Use Authorization (EUA).  This EUA will remain in effect (meaning this test can be used) for the duration of the COVID-19 declaration under Section 564(b)(1) of the Act, 21 U.S.C. section 360bbb-3(b)(1), unless the authorization is terminated or revoked sooner.  Performed at [facility], [address], [place], [place] [address]          Radiology Studies: DG Chest 2 View  Result Date: 09/17/2019 CLINICAL DATA:  Urosepsis, fever EXAM: CHEST - 2 VIEW COMPARISON:  None. FINDINGS: The heart size and mediastinal contours are within  normal limits. Both lungs are clear. The visualized skeletal structures are unremarkable. IMPRESSION: No active cardiopulmonary disease. Electronically Signed   By: Sharlet Salina M.D.   On: 09/17/2019 17:13   CT Renal Stone Study  Result Date: 09/17/2019 CLINICAL DATA:  29 year old male with acute abdominal and flank pain with fever for several days. EXAM: CT ABDOMEN AND PELVIS WITHOUT CONTRAST TECHNIQUE: Multidetector CT imaging of the abdomen and pelvis was performed following the standard protocol without IV contrast. COMPARISON:  11/23/2014 CT and prior studies FINDINGS: Please note that parenchymal abnormalities may be missed without intravenous contrast. Lower chest: No acute abnormality Hepatobiliary: Hepatic steatosis identified without focal hepatic abnormality. No gallbladder abnormalities are identified. There is no evidence of biliary dilatation. Pancreas: Unremarkable Spleen: Splenomegaly is again identified, slightly increased in size, now with a splenic volume of 800 cc. No focal splenic abnormalities are noted. Adrenals/Urinary Tract: Mild wall thickening of the LEFT renal pelvis and proximal LEFT ureter noted suspicious for infection. No other definite renal abnormalities are noted. There is no evidence of hydronephrosis or urinary calculi. Stomach/Bowel: Stomach is within normal limits. No evidence of bowel wall thickening, distention, or inflammatory changes. Vascular/Lymphatic: No significant vascular findings are present. No enlarged abdominal or pelvic lymph nodes. Reproductive: Prostate is unremarkable. Other: No ascites, focal collection or pneumoperitoneum. Musculoskeletal: No acute or suspicious bony abnormality noted. IMPRESSION: 1. Mild wall thickening of the LEFT renal pelvis and proximal LEFT ureter suspicious for infection. No evidence of hydronephrosis or urinary calculi. 2. Hepatic steatosis. 3. Splenomegaly, slightly increased in size since 2016, now with a splenic volume of  800 CC. Electronically Signed   By: Harmon Pier M.D.   On: 09/17/2019 18:24        Scheduled Meds: . folic acid  1 mg Oral Daily  . heparin  5,000 Units Subcutaneous Q8H  . multivitamin with minerals  1 tablet Oral Daily  . thiamine  100 mg Oral Daily   Or  . thiamine  100 mg Intravenous Daily   Continuous Infusions: .  sodium chloride 150 mL/hr at 09/18/19 0614  . sodium chloride 250 mL (09/18/19 0326)  . cefTRIAXone (ROCEPHIN)  IV       LOS: 1 day    Time spent: 35 minutes    Lealand Elting, MD [facility]   To contact the attending provider between 7A-7P or the covering provider during after hours 7P-7A, please log into the web site [email or web address] and access using universal [place] password for that web site. If you do not have the password, please call the hospital operator.  09/18/2019, 2:01 PM

## 2019-09-18 NOTE — Plan of Care (Signed)
°  Problem: Education: Goal: Knowledge of General Education information will improve Description: Including pain rating scale, medication(s)/side effects and non-pharmacologic comfort measures 09/18/2019 1507 by Vinetta Bergamo, RN Outcome: Progressing 09/18/2019 1507 by Vinetta Bergamo, RN Outcome: Progressing 09/18/2019 1429 by Vinetta Bergamo, RN Outcome: Progressing   Problem: Health Behavior/Discharge Planning: Goal: Ability to manage health-related needs will improve 09/18/2019 1507 by Vinetta Bergamo, RN Outcome: Progressing 09/18/2019 1507 by Vinetta Bergamo, RN Outcome: Progressing 09/18/2019 1429 by Gloris Manchester D, RN Outcome: Progressing   Problem: Clinical Measurements: Goal: Ability to maintain clinical measurements within normal limits will improve 09/18/2019 1507 by Vinetta Bergamo, RN Outcome: Progressing 09/18/2019 1507 by Vinetta Bergamo, RN Outcome: Progressing 09/18/2019 1429 by Gloris Manchester D, RN Outcome: Progressing Goal: Will remain free from infection 09/18/2019 1507 by Vinetta Bergamo, RN Outcome: Progressing 09/18/2019 1507 by Vinetta Bergamo, RN Outcome: Progressing 09/18/2019 1429 by Gloris Manchester D, RN Outcome: Progressing Goal: Diagnostic test results will improve 09/18/2019 1507 by Vinetta Bergamo, RN Outcome: Progressing 09/18/2019 1507 by Vinetta Bergamo, RN Outcome: Progressing 09/18/2019 1429 by Gloris Manchester D, RN Outcome: Progressing Goal: Respiratory complications will improve 09/18/2019 1507 by Vinetta Bergamo, RN Outcome: Progressing 09/18/2019 1507 by Vinetta Bergamo, RN Outcome: Progressing 09/18/2019 1429 by Gloris Manchester D, RN Outcome: Progressing Goal: Cardiovascular complication will be avoided 09/18/2019 1507 by Vinetta Bergamo, RN Outcome: Progressing 09/18/2019 1507 by Gloris Manchester D, RN Outcome: Progressing 09/18/2019 1429 by Gloris Manchester D, RN Outcome: Progressing    Problem: Urinary Elimination: Goal: Signs and symptoms of infection will decrease 09/18/2019 1507 by Vinetta Bergamo, RN Outcome: Progressing 09/18/2019 1507 by Vinetta Bergamo, RN Outcome: Progressing 09/18/2019 1429 by Vinetta Bergamo, RN Outcome: Progressing

## 2019-09-18 NOTE — Plan of Care (Signed)
  Problem: Education: Goal: Knowledge of General Education information will improve Description: Including pain rating scale, medication(s)/side effects and non-pharmacologic comfort measures Outcome: Progressing   Problem: Health Behavior/Discharge Planning: Goal: Ability to manage health-related needs will improve Outcome: Progressing   Problem: Clinical Measurements: Goal: Ability to maintain clinical measurements within normal limits will improve Outcome: Progressing Goal: Will remain free from infection Outcome: Progressing Goal: Diagnostic test results will improve Outcome: Progressing Goal: Respiratory complications will improve Outcome: Progressing Goal: Cardiovascular complication will be avoided Outcome: Progressing   Problem: Urinary Elimination: Goal: Signs and symptoms of infection will decrease Outcome: Progressing

## 2019-09-19 DIAGNOSIS — E538 Deficiency of other specified B group vitamins: Secondary | ICD-10-CM | POA: Diagnosis present

## 2019-09-19 DIAGNOSIS — E611 Iron deficiency: Secondary | ICD-10-CM | POA: Diagnosis present

## 2019-09-19 DIAGNOSIS — K700 Alcoholic fatty liver: Secondary | ICD-10-CM

## 2019-09-19 DIAGNOSIS — N10 Acute tubulo-interstitial nephritis: Secondary | ICD-10-CM | POA: Diagnosis present

## 2019-09-19 DIAGNOSIS — F1010 Alcohol abuse, uncomplicated: Secondary | ICD-10-CM

## 2019-09-19 DIAGNOSIS — D61818 Other pancytopenia: Secondary | ICD-10-CM | POA: Diagnosis present

## 2019-09-19 DIAGNOSIS — R6520 Severe sepsis without septic shock: Secondary | ICD-10-CM | POA: Diagnosis present

## 2019-09-19 LAB — CBC WITH DIFFERENTIAL/PLATELET
Abs Immature Granulocytes: 0.01 10*3/uL (ref 0.00–0.07)
Basophils Absolute: 0 10*3/uL (ref 0.0–0.1)
Basophils Relative: 1 %
Eosinophils Absolute: 0 10*3/uL (ref 0.0–0.5)
Eosinophils Relative: 2 %
HCT: 30.8 % — ABNORMAL LOW (ref 39.0–52.0)
Hemoglobin: 10.8 g/dL — ABNORMAL LOW (ref 13.0–17.0)
Immature Granulocytes: 1 %
Lymphocytes Relative: 58 %
Lymphs Abs: 1.2 10*3/uL (ref 0.7–4.0)
MCH: 37.5 pg — ABNORMAL HIGH (ref 26.0–34.0)
MCHC: 35.1 g/dL (ref 30.0–36.0)
MCV: 106.9 fL — ABNORMAL HIGH (ref 80.0–100.0)
Monocytes Absolute: 0.1 10*3/uL (ref 0.1–1.0)
Monocytes Relative: 5 %
Neutro Abs: 0.7 10*3/uL — ABNORMAL LOW (ref 1.7–7.7)
Neutrophils Relative %: 33 %
Platelets: 130 10*3/uL — ABNORMAL LOW (ref 150–400)
RBC: 2.88 MIL/uL — ABNORMAL LOW (ref 4.22–5.81)
RDW: 14.6 % (ref 11.5–15.5)
WBC: 2.1 10*3/uL — ABNORMAL LOW (ref 4.0–10.5)
nRBC: 0 % (ref 0.0–0.2)

## 2019-09-19 LAB — BASIC METABOLIC PANEL
Anion gap: 7 (ref 5–15)
BUN: 8 mg/dL (ref 6–20)
CO2: 27 mmol/L (ref 22–32)
Calcium: 8.7 mg/dL — ABNORMAL LOW (ref 8.9–10.3)
Chloride: 104 mmol/L (ref 98–111)
Creatinine, Ser: 0.75 mg/dL (ref 0.61–1.24)
GFR calc Af Amer: >60 mL/min (ref >60)
GFR calc non Af Amer: >60 mL/min (ref >60)
Glucose, Bld: 93 mg/dL (ref 70–99)
Potassium: 4 mmol/L (ref 3.5–5.1)
Sodium: 138 mmol/L (ref 135–145)

## 2019-09-19 LAB — MAGNESIUM: Magnesium: 1.9 mg/dL (ref 1.7–2.4)

## 2019-09-19 MED ORDER — CYANOCOBALAMIN 1000 MCG PO TABS: 1000.0000 ug | ORAL_TABLET | Freq: Every day | ORAL | 3 refills | Status: DC

## 2019-09-19 MED ORDER — THIAMINE HCL 100 MG PO TABS: 100.0000 mg | ORAL_TABLET | Freq: Every day | ORAL | 0 refills | Status: DC

## 2019-09-19 MED ORDER — FERROUS SULFATE 325 (65 FE) MG PO TABS: 325.0000 mg | ORAL_TABLET | Freq: Two times a day (BID) | ORAL | 3 refills | Status: DC

## 2019-09-19 MED ORDER — ADULT MULTIVITAMIN W/MINERALS CH: 1.0000 | ORAL_TABLET | Freq: Every day | ORAL | 0 refills | Status: DC

## 2019-09-19 MED ORDER — FOLIC ACID 1 MG PO TABS: 1.0000 mg | ORAL_TABLET | Freq: Every day | ORAL | 3 refills | Status: DC

## 2019-09-19 MED ORDER — AMOXICILLIN-POT CLAVULANATE 875-125 MG PO TABS
1.0000 | ORAL_TABLET | Freq: Two times a day (BID) | ORAL | 0 refills | Status: AC
Start: 2019-09-19 — End: 2019-09-29

## 2019-09-19 NOTE — Plan of Care (Signed)
Problem: Education: °Goal: Knowledge of General Education information will improve °Description: Including pain rating scale, medication(s)/side effects and non-pharmacologic comfort measures °Outcome: Completed/Met °  °

## 2019-09-19 NOTE — Progress Notes (Signed)
Patient being discharged home. Reviewed discharge instructions. Script given for mvi. Patient verbalizes understanding of discharge teaching and denies questions or concerns

## 2019-09-19 NOTE — Discharge Instructions (Signed)
Pyelonephritis, Adult  Pyelonephritis is an infection that occurs in the kidney. The kidneys are organs that help clean the blood by moving waste out of the blood and into the pee (urine). This infection can happen quickly, or it can last for a long time. In most cases, it clears up with treatment and does not cause other problems. What are the causes? This condition may be caused by:  Germs (bacteria) going from the bladder up to the kidney. This may happen after having a bladder infection.  Germs going from the blood to the kidney. What increases the risk? This condition is more likely to develop in:  Pregnant women.  Older people.  People who have any of these conditions: ? Diabetes. ? Inflammation of the prostate gland (prostatitis), in males. ? Kidney stones or bladder stones. ? Other problems with the kidney or the parts of your body that carry pee from the kidneys to the bladder (ureters). ? Cancer.  People who have a small, thin tube (catheter) placed in the bladder.  People who are sexually active.  Women who use a medicine that kills sperm (spermicide) to prevent pregnancy.  People who have had a prior urinary tract infection (UTI). What are the signs or symptoms? Symptoms of this condition include:  Peeing often.  A strong urge to pee right away.  Burning or stinging when peeing.  Belly pain.  Back pain.  Pain in the side (flank area).  Fever or chills.  Blood in the pee, or dark pee.  Feeling sick to your stomach (nauseous) or throwing up (vomiting). How is this treated? This condition may be treated by:  Taking antibiotic medicines by mouth (orally).  Drinking enough fluids. If the infection is bad, you may need to stay in the hospital. You may be given antibiotics and fluids that are put directly into a vein through an IV tube. In some cases, other treatments may be needed. Follow these instructions at home: Medicines  Take your antibiotic  medicine as told by your doctor. Do not stop taking the antibiotic even if you start to feel better.  Take over-the-counter and prescription medicines only as told by your doctor. General instructions   Drink enough fluid to keep your pee pale yellow.  Avoid caffeine, tea, and carbonated drinks.  Pee (urinate) often. Avoid holding in pee for long periods of time.  Pee before and after sex.  After pooping (having a bowel movement), women should wipe from front to back. Use each tissue only once.  Keep all follow-up visits as told by your doctor. This is important. Contact a doctor if:  You do not feel better after 2 days.  Your symptoms get worse.  You have a fever. Get help right away if:  You cannot take your medicine or drink fluids as told.  You have chills and shaking.  You throw up.  You have very bad pain in your side or back.  You feel very weak or you pass out (faint). Summary  Pyelonephritis is an infection that occurs in the kidney.  In most cases, this infection clears up with treatment and does not cause other problems.  Take your antibiotic medicine as told by your doctor. Do not stop taking the antibiotic even if you start to feel better.  Drink enough fluid to keep your pee pale yellow. This information is not intended to replace advice given to you by your health care provider. Make sure you discuss any questions you have with   your health care provider. Document Revised: 01/28/2018 Document Reviewed: 01/28/2018 Elsevier Patient Education  2020 Elsevier Inc.  

## 2019-09-19 NOTE — Discharge Summary (Signed)
Physician Discharge Summary  Curtis Erickson HWE:[id number] DOB: December 18, 1990 DOA: 09/17/2019  PCP: Patient, [name]  Admit date: 09/17/2019 Discharge date: 09/19/2019  Admitted From: Home Disposition: Home  Recommendations for Outpatient Follow-up:  Patient will be discharged on 10 days of oral Augmentin until 6/22.  Home Health: None Equipment/Devices: None  Discharge Condition: Fair CODE STATUS: Full code Diet recommendation: Regular   Discharge Diagnoses:  Principal Problem:   Severe sepsis without septic shock  (HCC)   Active Problems:   Acute pyelonephritis   Pancytopenia (HCC)   B12 deficiency   Folate deficiency   Dietary iron deficiency   ETOH abuse   Alcoholic fatty liver NSVT  Brief narrative/HPI 29 year old male with history of hypertension, kidney stones and gout presented to the ED with dysuria and urinary urgency for almost 10 days.  Denied any abdominal pain, nausea or vomiting.  On the day of admission he started having fevers with chills.  In the ED he was septic with tachycardia, fever of 101.18F, neutropenia with WBC of 2.3, lactic acid of 3, transaminitis and UA positive for UTI.  CT renal study showing findings suggestive of left-sided pyelonephritis. Admitted for severe sepsis secondary to UTI with pyelonephritis.  Hospital course  Principal problem Severe sepsis without septic shock (HCC) Secondary to UTI with left-sided pyelonephritis.  Associated pancytopenia.    Sepsis resolved with empiric IV Rocephin and aggressive IV hydration.  Pancytopenia improving. Urine culture growing gram-negative rods.  Sensitivity pending.  Given clinical improvement and patient insisting on going home I will discharge him on oral Augmentin for 10 days to complete antibiotic course.  I will follow up with final sensitivity.  Blood cultures have been negative.   Active problems Pancytopenia Possibly due to sepsis, alcoholic vs B12 marrow suppression.   Patient  reports drinking 6-8 beers a week but was binge drinking heavily recently while he was at the beach.  I suspect in general he drinks more than what he claims.  Counts are improving in a.m. lab.  Needs to be monitored as outpatient. Mildly elevated fibrinogen but with normal LDH and INR.    Alcoholic fatty liver. LFTs improving.  Abdominal ultrasound suggest fatty changes.  No signs of alcohol withdrawal.  Hepatitis panel negative.  Counseled strongly on alcohol cessation.  LFTs improving in a.m. lab.  Follow abdominal ultrasound.  Monitor on CIWA.  No signs of alcohol withdrawal.  Counseled strongly on alcohol cessation.  Hepatitis panel negative.  Iron deficiency anemia, B12 and folic acid deficiency. Likely nutritional deficiency.    Patient informed that he  has very poor dietary habits and he is meals usually involve junk foods /fast foods only..  Given IM B12 x1.  add p.o. B12 and folate supplement.  Added iron supplement as well.. Instructed on avoiding NSAIDs.   NSVT Low potassium and magnesium replenished.  Elevated blood glucose CBGs in 120s-130s.    A1c of 4.8.  Patient clinically stable to be discharged home.  He needs to establish care with a local PCP.  Discharge Instructions   Allergies as of 09/19/2019   No Known Allergies     Medication List    STOP taking these medications   colchicine 0.6 MG tablet   indomethacin 50 MG capsule Commonly known as: INDOCIN     TAKE these medications   amoxicillin-clavulanate 875-125 MG tablet Commonly known as: Augmentin Take 1 tablet by mouth 2 (two) times daily for 10 days.   cyanocobalamin 1000 MCG tablet Take  1 tablet (1,000 mcg total) by mouth daily. Start taking on: September 20, 2019   ferrous sulfate 325 (65 FE) MG tablet Take 1 tablet (325 mg total) by mouth 2 (two) times daily with a meal.   folic acid 1 MG tablet Commonly known as: FOLVITE Take 1 tablet (1 mg total) by mouth daily. Start taking on: September 20, 2019   multivitamin with minerals Tabs tablet Take 1 tablet by mouth daily. Start taking on: September 20, 2019   thiamine 100 MG tablet Take 1 tablet (100 mg total) by mouth daily. Start taking on: September 20, 2019       No Known Allergies      Procedures/Studies: DG Chest 2 View  Result Date: 09/17/2019 CLINICAL DATA:  Urosepsis, fever EXAM: CHEST - 2 VIEW COMPARISON:  None. FINDINGS: The heart size and mediastinal contours are within normal limits. Both lungs are clear. The visualized skeletal structures are unremarkable. IMPRESSION: No active cardiopulmonary disease. Electronically Signed   By: Sharlet Salina M.D.   On: 09/17/2019 17:13   CT Renal Stone Study  Result Date: 09/17/2019 CLINICAL DATA:  29 year old male with acute abdominal and flank pain with fever for several days. EXAM: CT ABDOMEN AND PELVIS WITHOUT CONTRAST TECHNIQUE: Multidetector CT imaging of the abdomen and pelvis was performed following the standard protocol without IV contrast. COMPARISON:  11/23/2014 CT and prior studies FINDINGS: Please note that parenchymal abnormalities may be missed without intravenous contrast. Lower chest: No acute abnormality Hepatobiliary: Hepatic steatosis identified without focal hepatic abnormality. No gallbladder abnormalities are identified. There is no evidence of biliary dilatation. Pancreas: Unremarkable Spleen: Splenomegaly is again identified, slightly increased in size, now with a splenic volume of 800 cc. No focal splenic abnormalities are noted. Adrenals/Urinary Tract: Mild wall thickening of the LEFT renal pelvis and proximal LEFT ureter noted suspicious for infection. No other definite renal abnormalities are noted. There is no evidence of hydronephrosis or urinary calculi. Stomach/Bowel: Stomach is within normal limits. No evidence of bowel wall thickening, distention, or inflammatory changes. Vascular/Lymphatic: No significant vascular findings are present. No enlarged abdominal  or pelvic lymph nodes. Reproductive: Prostate is unremarkable. Other: No ascites, focal collection or pneumoperitoneum. Musculoskeletal: No acute or suspicious bony abnormality noted. IMPRESSION: 1. Mild wall thickening of the LEFT renal pelvis and proximal LEFT ureter suspicious for infection. No evidence of hydronephrosis or urinary calculi. 2. Hepatic steatosis. 3. Splenomegaly, slightly increased in size since 2016, now with a splenic volume of 800 CC. Electronically Signed   By: Harmon Pier M.D.   On: 09/17/2019 18:24   US Abdomen Limited RUQ  Result Date: 09/18/2019 CLINICAL DATA:  Liver cell injury. EXAM: ULTRASOUND ABDOMEN LIMITED RIGHT UPPER QUADRANT COMPARISON:  Noncontrast CT yesterday. FINDINGS: Gallbladder: Physiologically distended. No gallstones or wall thickening visualized. No sonographic Murphy sign noted by sonographer. Common bile duct: Diameter: 4 mm, normal. Liver: No focal lesion identified. Diffusely increased in parenchymal echogenicity. Portal vein is patent on color Doppler imaging with normal direction of blood flow towards the liver. Other: No right upper quadrant ascites. IMPRESSION: 1. Diffusely increased hepatic echogenicity most consistent with steatosis. 2. Normal sonographic appearance of the gallbladder and biliary tree. Electronically Signed   By: Narda Rutherford M.D.   On: 09/18/2019 16:42    Subjective: Seen and examined.  Had short run of NSVT yesterday evening but patient was asymptomatic.  No overnight events.  Continues to feel better, no fevers or chills and no urinary symptoms.  Patient insists  on going home today.  Discharge Exam: Vitals:   09/19/19 0729 09/19/19 1120  BP: 122/73 126/76  Pulse: 83 85  Resp: 19 19  Temp: 98 F (36.7 C) 98 F (36.7 C)  SpO2: 98% 98%   Vitals:   09/18/19 2010 09/19/19 0436 09/19/19 0729 09/19/19 1120  BP: (!) 143/86 138/89 122/73 126/76  Pulse: (!) 101 85 83 85  Resp: Temp: 99.3 F (37.4 C) 98.4 F  (36.9 C) 98 F (36.7 C) 98 F (36.7 C)  TempSrc: Oral Oral Oral Oral  SpO2: 98% 99% 98% 98%  Weight:  87.1 kg    Height:        General: Young male not in distress HEENT: Moist mucosa, supple neck Chest: Clear bilaterally CVs: Normal S1-S2, no murmur rub or gallop GI: Soft, nondistended, nontender Musculoskeletal: Warm, no edema    The results of significant diagnostics from this hospitalization (including imaging, microbiology, ancillary and laboratory) are listed below for reference.     Microbiology: Recent Results (from the past 240 hour(s))  Urine culture     Status: Abnormal (Preliminary result)   Collection Time: 09/17/19  4:22 PM   Specimen: Urine, Random  Result Value Ref Range Status   Specimen Description   Final    URINE, RANDOM Performed at [facility], [address] [place], [place] [address]    Special Requests   Final    NONE Performed at [facility], [address], [place], [place] [address]    Culture >=100,000 COLONIES/mL GRAM NEGATIVE RODS (A)  Final   Report Status PENDING  Incomplete  Chlamydia/NGC rt PCR ([facility] only)     Status: None   Collection Time: 09/17/19  4:22 PM   Specimen: Urine; Genital  Result Value Ref Range Status   Specimen source GC/Chlam URINE, RANDOM  Final   Chlamydia Tr NOT DETECTED NOT DETECTED Final   N gonorrhoeae NOT DETECTED NOT DETECTED Final    Comment: (NOTE) This CT/NG assay has not been evaluated in patients with a history of  hysterectomy. Performed at [facility], [address] [place], [place] [address]   Blood culture (routine x 2)     Status: None (Preliminary result)   Collection Time: 09/17/19  5:57 PM   Specimen: BLOOD  Result Value Ref Range Status   Specimen Description BLOOD RIGHT ANTECUBITAL  Final   Special Requests   Final    BOTTLES DRAWN AEROBIC AND ANAEROBIC Blood Culture adequate volume   Culture   Final    NO GROWTH 2 DAYS Performed at [facility], [address], [place], [place] [address]    Report Status PENDING  Incomplete  Blood culture (routine x 2)     Status: None (Preliminary result)   Collection Time: 09/17/19  5:57 PM   Specimen: BLOOD  Result Value Ref Range Status   Specimen Description BLOOD LEFT ANTECUBITAL  Final   Special Requests   Final    BOTTLES DRAWN AEROBIC AND ANAEROBIC Blood Culture adequate volume   Culture   Final    NO GROWTH 2 DAYS Performed at [facility], [address], [place], [place] [address]    Report Status PENDING  Incomplete  SARS Coronavirus 2 by RT PCR (hospital order, performed in [facility] hospital lab) Nasopharyngeal Nasopharyngeal Swab     Status: None   Collection Time: 09/17/19  5:57 PM   Specimen: Nasopharyngeal Swab  Result Value Ref  Range Status   SARS Coronavirus 2 NEGATIVE NEGATIVE Final    Comment: (NOTE) SARS-CoV-2 target nucleic acids are NOT DETECTED.  The SARS-CoV-2 RNA is generally detectable in upper and lower respiratory specimens during the acute phase of infection. The lowest concentration of SARS-CoV-2 viral copies this assay can detect is 250 copies / mL. A negative result does not preclude SARS-CoV-2 infection and should not be used as the sole basis for treatment or other patient management decisions.  A negative result may occur with improper specimen collection / handling, submission of specimen other than nasopharyngeal swab, presence of viral mutation(s) within the areas targeted by this assay, and inadequate number of viral copies (<250 copies / mL). A negative result must be combined with clinical observations, patient history, and epidemiological information.  Fact Sheet for Patients:   BoilerBrush.com.cyhttps://www.fda.gov/media/136312/download  Fact Sheet for Healthcare Providers: https://pope.com/https://www.fda.gov/media/136313/download  This test is not yet approved or  cleared by the [place] FDA and has been authorized for detection and/or  diagnosis of SARS-CoV-2 by FDA under an Emergency Use Authorization (EUA).  This EUA will remain in effect (meaning this test can be used) for the duration of the COVID-19 declaration under Section 564(b)(1) of the Act, 21 U.S.C. section 360bbb-3(b)(1), unless the authorization is terminated or revoked sooner.  Performed at [facility], [address] [place], [place] [id number]      Labs: BNP (last 3 results) No results for input(s): BNP in the last 8760 hours. Basic Metabolic Panel: Recent Labs  Lab 09/17/19 1622 09/17/19 1902 09/18/19 0046 09/18/19 0653 09/19/19 0341  NA 134*  --  137 138 138  K 3.7  --  3.5 4.1 4.0  CL 96*  --  102 106 104  CO2 26  --  28 24 27   GLUCOSE 125*  --  119* 119* 93  BUN 8  --  8 8 8   CREATININE 0.94 1.02 0.87 0.77 0.75  CALCIUM 9.2  --  8.3* 8.1* 8.7*  MG  --   --  1.4* 2.4 1.9  PHOS  --   --  3.2  --   --    Liver Function Tests: Recent Labs  Lab 09/17/19 1622 09/18/19 0046 09/18/19 0653  AST 51* 41 43*  ALT 82* 64* 64*  ALKPHOS 70 57 58  BILITOT 2.0* 1.1 1.0  PROT 7.9 6.3* 6.1*  ALBUMIN 4.2 3.2* 3.3*   No results for input(s): LIPASE, AMYLASE in the last 168 hours. No results for input(s): AMMONIA in the last 168 hours. CBC: Recent Labs  Lab 09/17/19 1622 09/17/19 1902 09/18/19 0046 09/18/19 0653 09/19/19 0341  WBC 2.3* 2.3* 1.5* 1.7* 2.1*  NEUTROABS 1.7  --   --   --  0.7*  HGB 12.5* 11.1* 12.2* 10.4* 10.8*  HCT 34.4* 30.2* 34.0* 29.3* 30.8*  MCV 104.2* 102.7* 105.9* 106.5* 106.9*  PLT 124* 123* 101* 116* 130*   Cardiac Enzymes: No results for input(s): CKTOTAL, CKMB, CKMBINDEX, TROPONINI in the last 168 hours. BNP: Invalid input(s): POCBNP CBG: No results for input(s): GLUCAP in the last 168 hours. D-Dimer No results for input(s): DDIMER in the last 72 hours. Hgb A1c Recent Labs    09/17/19 1902  HGBA1C 4.9   Lipid Profile No results for input(s): CHOL, HDL, LDLCALC, TRIG, CHOLHDL, LDLDIRECT  in the last 72 hours. Thyroid function studies Recent Labs    09/17/19 1902  TSH 1.621   Anemia work up Recent Labs    09/17/19 1937  VITAMINB12 143*  FOLATE 2.3*  FERRITIN 370*  TIBC 360  IRON 35*  RETICCTPCT 2.5   Urinalysis    Component Value Date/Time   COLORURINE YELLOW (A) 09/17/2019 1622   APPEARANCEUR CLEAR (A) 09/17/2019 1622   LABSPEC 1.011 09/17/2019 1622   PHURINE 9.0 (H) 09/17/2019 1622   GLUCOSEU NEGATIVE 09/17/2019 1622   HGBUR NEGATIVE 09/17/2019 1622   BILIRUBINUR NEGATIVE 09/17/2019 1622   KETONESUR NEGATIVE 09/17/2019 1622   PROTEINUR 30 (A) 09/17/2019 1622   NITRITE NEGATIVE 09/17/2019 1622   LEUKOCYTESUR SMALL (A) 09/17/2019 1622   Sepsis Labs Invalid input(s): PROCALCITONIN,  WBC,  LACTICIDVEN Microbiology Recent Results (from the past 240 hour(s))  Urine culture     Status: Abnormal (Preliminary result)   Collection Time: 09/17/19  4:22 PM   Specimen: Urine, Random  Result Value Ref Range Status   Specimen Description   Final    URINE, RANDOM Performed at [facility], [address] [place], [place] [address]    Special Requests   Final    NONE Performed at [facility], [address], [place], [place] [address]    Culture >=100,000 COLONIES/mL GRAM NEGATIVE RODS (A)  Final   Report Status PENDING  Incomplete  Chlamydia/NGC rt PCR ([facility] only)     Status: None   Collection Time: 09/17/19  4:22 PM   Specimen: Urine; Genital  Result Value Ref Range Status   Specimen source GC/Chlam URINE, RANDOM  Final   Chlamydia Tr NOT DETECTED NOT DETECTED Final   N gonorrhoeae NOT DETECTED NOT DETECTED Final    Comment: (NOTE) This CT/NG assay has not been evaluated in patients with a history of  hysterectomy. Performed at [facility], [address] [place], [place] [address]   Blood culture (routine x 2)     Status: None (Preliminary result)   Collection Time: 09/17/19  5:57 PM   Specimen: BLOOD  Result  Value Ref Range Status   Specimen Description BLOOD RIGHT ANTECUBITAL  Final   Special Requests   Final    BOTTLES DRAWN AEROBIC AND ANAEROBIC Blood Culture adequate volume   Culture   Final    NO GROWTH 2 DAYS Performed at [facility], [address], [place], [place] [address]    Report Status PENDING  Incomplete  Blood culture (routine x 2)     Status: None (Preliminary result)   Collection Time: 09/17/19  5:57 PM   Specimen: BLOOD  Result Value Ref Range Status   Specimen Description BLOOD LEFT ANTECUBITAL  Final   Special Requests   Final    BOTTLES DRAWN AEROBIC AND ANAEROBIC Blood Culture adequate volume   Culture   Final    NO GROWTH 2 DAYS Performed at [facility], [address] [place], [place] [address]    Report Status PENDING  Incomplete  SARS Coronavirus 2 by RT PCR (hospital order, performed in [facility] hospital lab) Nasopharyngeal Nasopharyngeal Swab     Status: None   Collection Time: 09/17/19  5:57 PM   Specimen: Nasopharyngeal Swab  Result Value Ref Range Status   SARS Coronavirus 2 NEGATIVE NEGATIVE Final    Comment: (NOTE) SARS-CoV-2 target nucleic acids are NOT DETECTED.  The SARS-CoV-2 RNA is generally detectable in upper and lower respiratory specimens during the acute phase of infection. The lowest concentration of SARS-CoV-2 viral copies this assay can detect is 250 copies / mL. A negative result does not preclude SARS-CoV-2 infection and should not  be used as the sole basis for treatment or other patient management decisions.  A negative result may occur with improper specimen collection / handling, submission of specimen other than nasopharyngeal swab, presence of viral mutation(s) within the areas targeted by this assay, and inadequate number of viral copies (<250 copies / mL). A negative result must be combined with clinical observations, patient history, and epidemiological information.  Fact Sheet for Patients:    StrictlyIdeas.no  Fact Sheet for Healthcare Providers: BankingDealers.co.za  This test is not yet approved or  cleared by the [place] FDA and has been authorized for detection and/or diagnosis of SARS-CoV-2 by FDA under an Emergency Use Authorization (EUA).  This EUA will remain in effect (meaning this test can be used) for the duration of the COVID-19 declaration under Section 564(b)(1) of the Act, 21 U.S.C. section 360bbb-3(b)(1), unless the authorization is terminated or revoked sooner.  Performed at [facility], [address], [place], [place] [address]      Time coordinating discharge: 35 minutes  SIGNED:   Louellen Molder, MD  [facility] 09/19/2019, 11:21 AM Pager   If 7PM-7AM, please contact night-coverage [email or web address] Password [email or web address]

## 2019-09-20 LAB — URINE CULTURE: Culture: >=100000 — AB

## 2019-09-22 LAB — CULTURE, BLOOD (ROUTINE X 2)
Culture: NO GROWTH
Culture: NO GROWTH
Special Requests: ADEQUATE
Special Requests: ADEQUATE

## 2020-09-03 IMAGING — US US ABDOMEN LIMITED
1 series · 14 of 25 positions shown · non-contrast
Comparison: Noncontrast CT yesterday.

CLINICAL DATA: Liver cell injury.

EXAM:
ULTRASOUND ABDOMEN LIMITED RIGHT UPPER QUADRANT

[Series 1: us abdomen limited ruq · 14 of 43 slices shown]
[im 1/43]
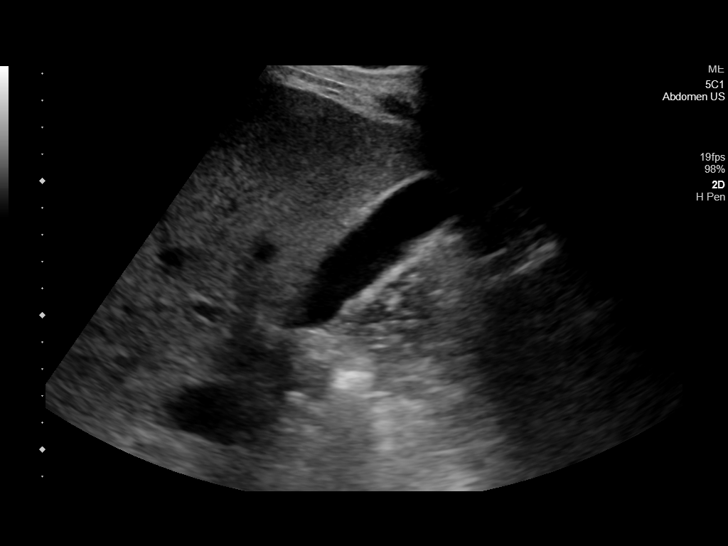
[im 4/43]
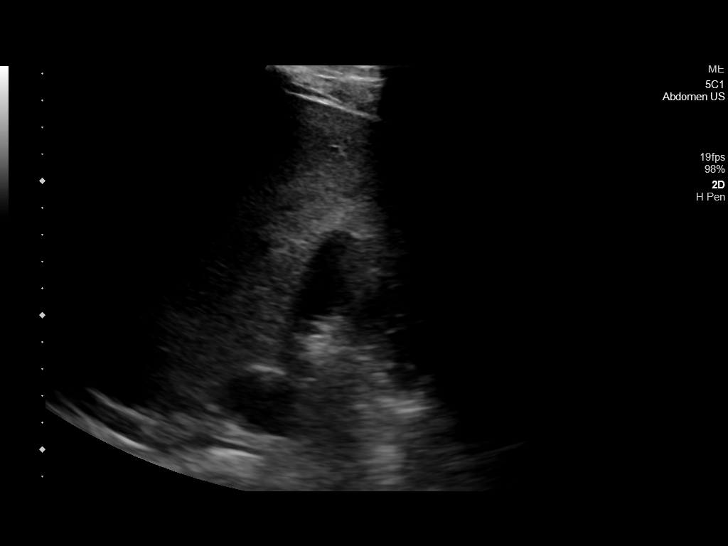
[im 8/43]
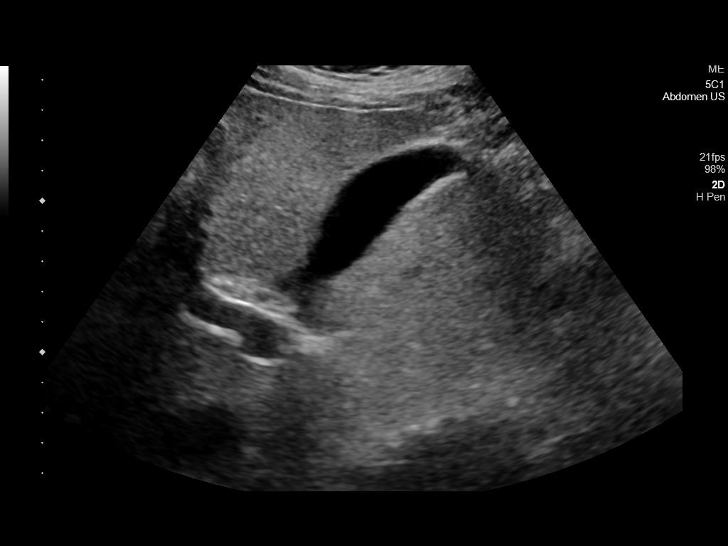
[im 11/43]
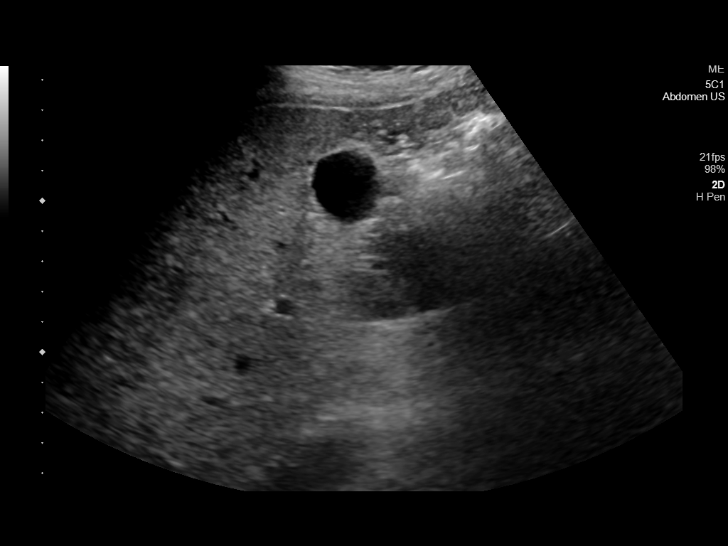
[im 15/43]
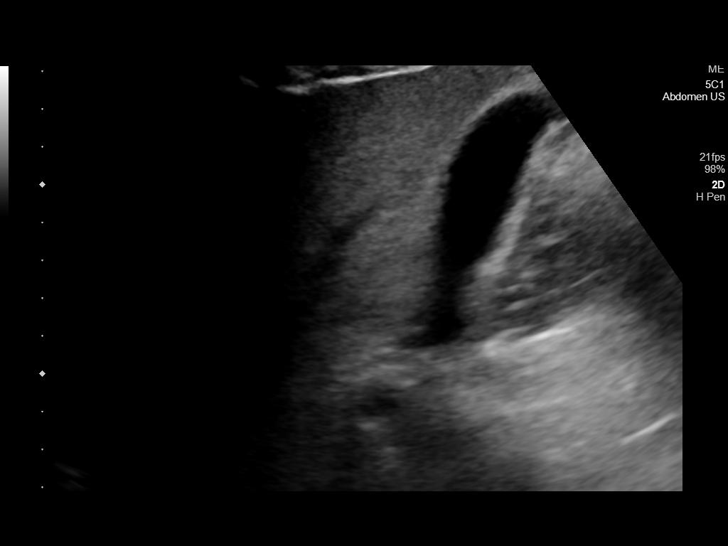
[im 16/43]
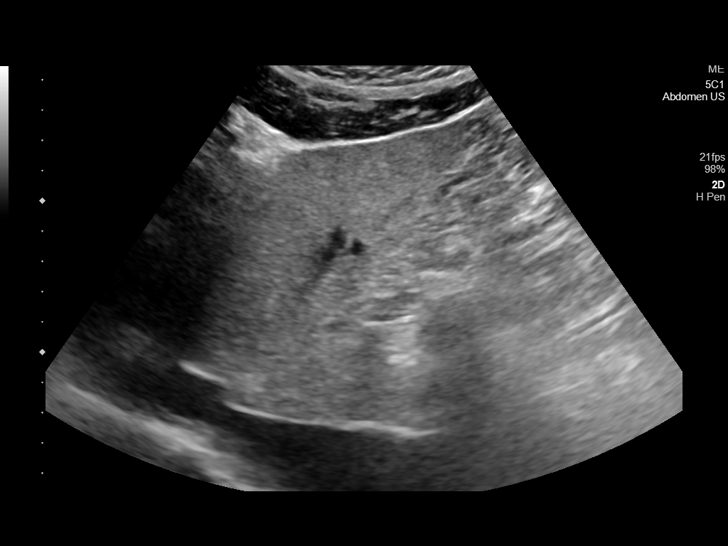
[im 20/43]
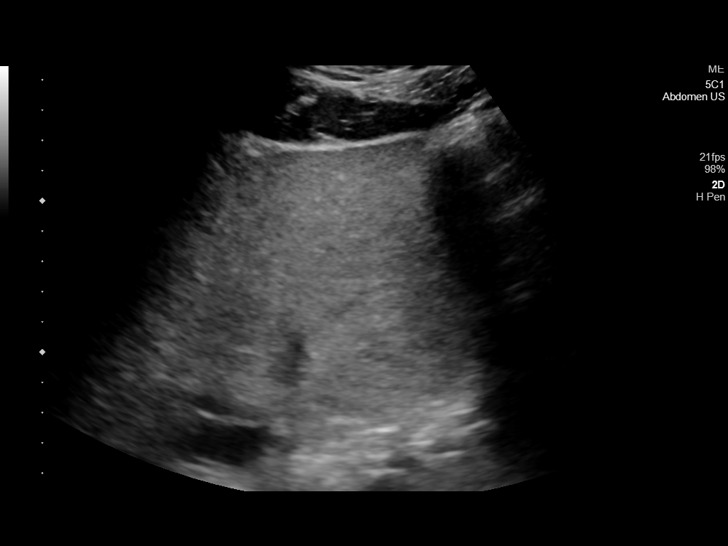
[im 23/43]
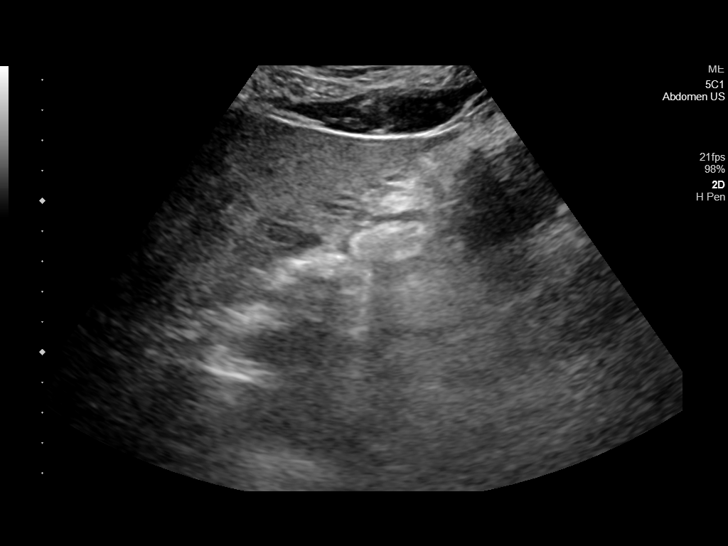
[im 27/43]
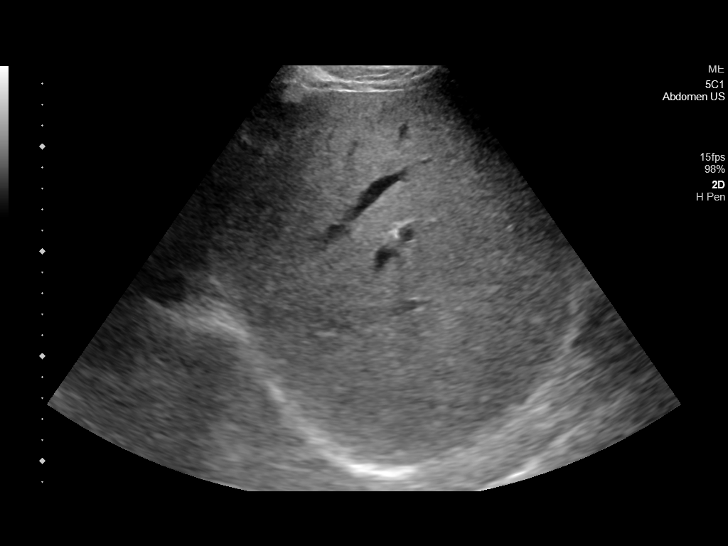
[im 29/43]
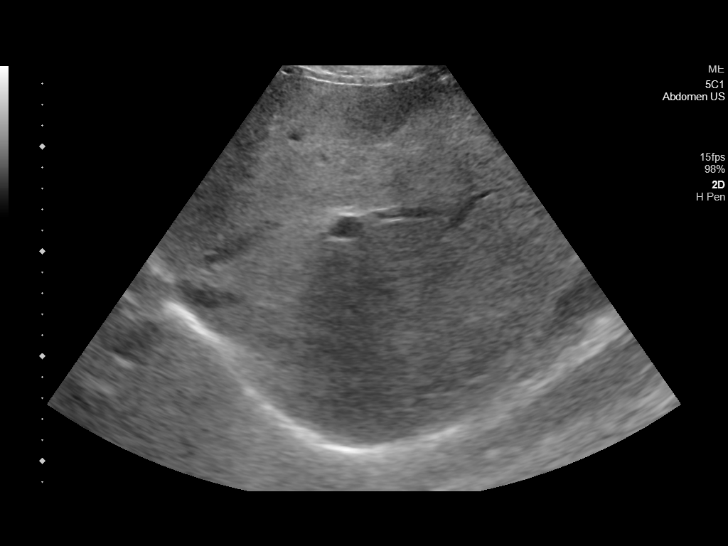
[im 32/43]
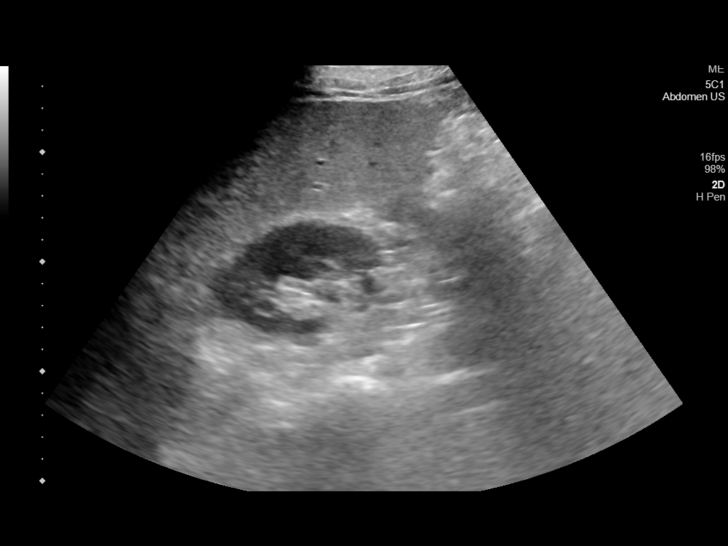
[im 36/43]
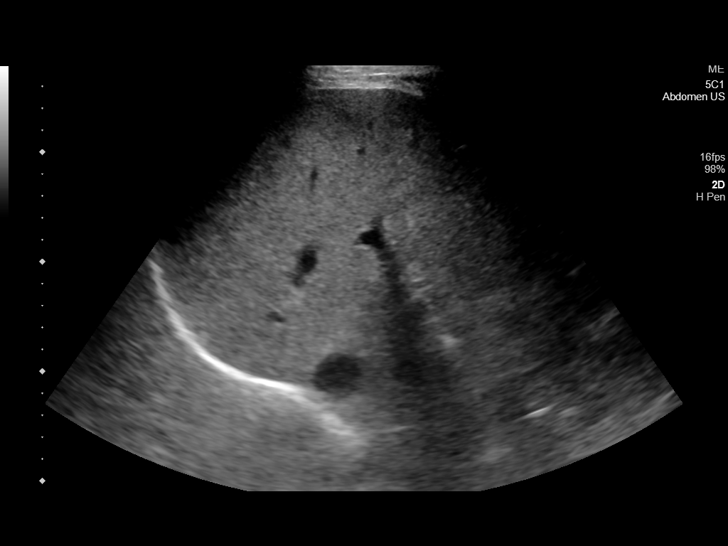
[im 39/43]
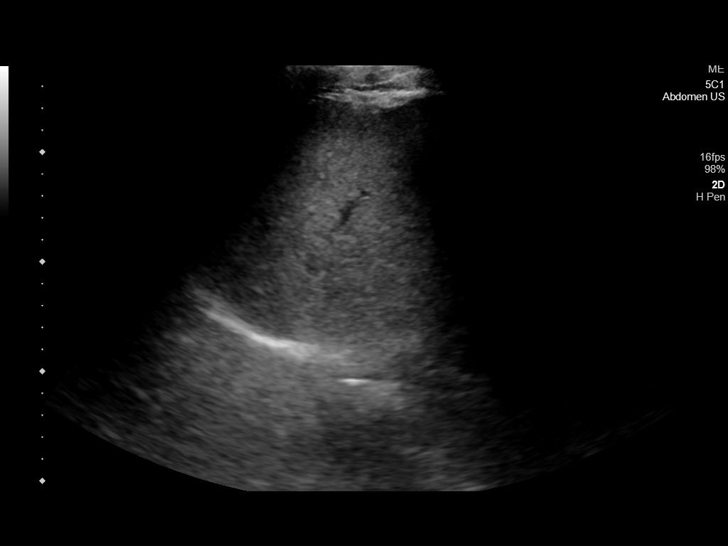
[im 43/43]
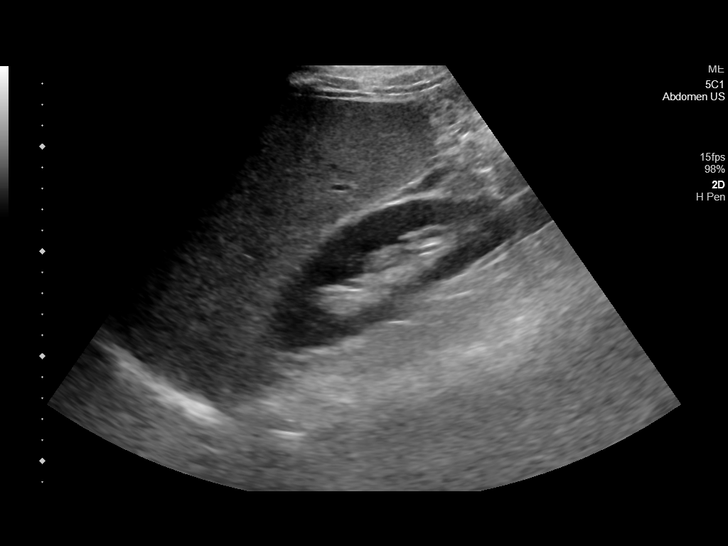

[14 of 25 positions shown; findings below may reference images not displayed]

FINDINGS: Gallbladder:

Physiologically distended. No gallstones or wall thickening
visualized. No sonographic Murphy sign noted by sonographer.

Common bile duct:

Diameter: 4 mm, normal.

Liver:

No focal lesion identified. Diffusely increased in parenchymal
echogenicity. Portal vein is patent on color Doppler imaging with
normal direction of blood flow towards the liver.

Other: No right upper quadrant ascites.
IMPRESSION: 1. Diffusely increased hepatic echogenicity most consistent with
steatosis.
2. Normal sonographic appearance of the gallbladder and biliary
tree.

## 2021-03-09 ENCOUNTER — Other Ambulatory Visit: Payer: Self-pay

## 2021-03-09 ENCOUNTER — Inpatient Hospital Stay (HOSPITAL_COMMUNITY)
Admission: EM | Admit: 2021-03-09 | Discharge: 2021-03-15 | DRG: 871 | Disposition: A | Discharge status: Home / Self Care | Payer: Self-pay | Attending: Internal Medicine | Admitting: Internal Medicine

## 2021-03-09 ENCOUNTER — Inpatient Hospital Stay (HOSPITAL_COMMUNITY): Payer: Self-pay

## 2021-03-09 ENCOUNTER — Emergency Department (HOSPITAL_COMMUNITY): Payer: Self-pay

## 2021-03-09 ENCOUNTER — Encounter (HOSPITAL_COMMUNITY): Payer: Self-pay | Admitting: Emergency Medicine

## 2021-03-09 DIAGNOSIS — B179 Acute viral hepatitis, unspecified: Secondary | ICD-10-CM | POA: Diagnosis present

## 2021-03-09 DIAGNOSIS — Z20822 Contact with and (suspected) exposure to covid-19: Secondary | ICD-10-CM | POA: Diagnosis present

## 2021-03-09 DIAGNOSIS — M1A9XX0 Chronic gout, unspecified, without tophus (tophi): Secondary | ICD-10-CM | POA: Diagnosis present

## 2021-03-09 DIAGNOSIS — J9601 Acute respiratory failure with hypoxia: Secondary | ICD-10-CM

## 2021-03-09 DIAGNOSIS — Z79899 Other long term (current) drug therapy: Secondary | ICD-10-CM

## 2021-03-09 DIAGNOSIS — R609 Edema, unspecified: Secondary | ICD-10-CM | POA: Diagnosis present

## 2021-03-09 DIAGNOSIS — R601 Generalized edema: Secondary | ICD-10-CM

## 2021-03-09 DIAGNOSIS — R1011 Right upper quadrant pain: Secondary | ICD-10-CM

## 2021-03-09 DIAGNOSIS — D539 Nutritional anemia, unspecified: Secondary | ICD-10-CM | POA: Diagnosis present

## 2021-03-09 DIAGNOSIS — Z452 Encounter for adjustment and management of vascular access device: Secondary | ICD-10-CM

## 2021-03-09 DIAGNOSIS — N179 Acute kidney failure, unspecified: Secondary | ICD-10-CM

## 2021-03-09 DIAGNOSIS — N10 Acute tubulo-interstitial nephritis: Secondary | ICD-10-CM | POA: Diagnosis present

## 2021-03-09 DIAGNOSIS — M25461 Effusion, right knee: Secondary | ICD-10-CM | POA: Diagnosis present

## 2021-03-09 DIAGNOSIS — E876 Hypokalemia: Secondary | ICD-10-CM | POA: Diagnosis present

## 2021-03-09 DIAGNOSIS — K700 Alcoholic fatty liver: Secondary | ICD-10-CM | POA: Diagnosis present

## 2021-03-09 DIAGNOSIS — E871 Hypo-osmolality and hyponatremia: Secondary | ICD-10-CM | POA: Diagnosis present

## 2021-03-09 DIAGNOSIS — Z87442 Personal history of urinary calculi: Secondary | ICD-10-CM

## 2021-03-09 DIAGNOSIS — D7589 Other specified diseases of blood and blood-forming organs: Secondary | ICD-10-CM | POA: Diagnosis present

## 2021-03-09 DIAGNOSIS — E872 Acidosis, unspecified: Secondary | ICD-10-CM

## 2021-03-09 DIAGNOSIS — E874 Mixed disorder of acid-base balance: Secondary | ICD-10-CM | POA: Diagnosis present

## 2021-03-09 DIAGNOSIS — T504X5A Adverse effect of drugs affecting uric acid metabolism, initial encounter: Secondary | ICD-10-CM | POA: Diagnosis present

## 2021-03-09 DIAGNOSIS — A419 Sepsis, unspecified organism: Principal | ICD-10-CM | POA: Diagnosis not present

## 2021-03-09 DIAGNOSIS — E538 Deficiency of other specified B group vitamins: Secondary | ICD-10-CM | POA: Diagnosis present

## 2021-03-09 DIAGNOSIS — E8729 Other acidosis: Secondary | ICD-10-CM

## 2021-03-09 DIAGNOSIS — R6521 Severe sepsis with septic shock: Secondary | ICD-10-CM | POA: Diagnosis present

## 2021-03-09 DIAGNOSIS — S36119A Unspecified injury of liver, initial encounter: Secondary | ICD-10-CM | POA: Diagnosis present

## 2021-03-09 DIAGNOSIS — R739 Hyperglycemia, unspecified: Secondary | ICD-10-CM | POA: Diagnosis present

## 2021-03-09 DIAGNOSIS — M109 Gout, unspecified: Secondary | ICD-10-CM

## 2021-03-09 DIAGNOSIS — K712 Toxic liver disease with acute hepatitis: Secondary | ICD-10-CM | POA: Diagnosis present

## 2021-03-09 DIAGNOSIS — I10 Essential (primary) hypertension: Secondary | ICD-10-CM | POA: Diagnosis present

## 2021-03-09 DIAGNOSIS — R791 Abnormal coagulation profile: Secondary | ICD-10-CM | POA: Diagnosis present

## 2021-03-09 DIAGNOSIS — E873 Alkalosis: Secondary | ICD-10-CM

## 2021-03-09 DIAGNOSIS — R000 Tachycardia, unspecified: Secondary | ICD-10-CM

## 2021-03-09 DIAGNOSIS — D509 Iron deficiency anemia, unspecified: Secondary | ICD-10-CM | POA: Diagnosis present

## 2021-03-09 DIAGNOSIS — K767 Hepatorenal syndrome: Secondary | ICD-10-CM | POA: Diagnosis present

## 2021-03-09 DIAGNOSIS — K7200 Acute and subacute hepatic failure without coma: Secondary | ICD-10-CM

## 2021-03-09 DIAGNOSIS — E875 Hyperkalemia: Secondary | ICD-10-CM | POA: Diagnosis present

## 2021-03-09 DIAGNOSIS — D61818 Other pancytopenia: Secondary | ICD-10-CM | POA: Diagnosis present

## 2021-03-09 HISTORY — DX: Gout, unspecified: M10.9

## 2021-03-09 LAB — I-STAT ARTERIAL BLOOD GAS, ED
Acid-base deficit: 16 mmol/L — ABNORMAL HIGH (ref 0.0–2.0)
Bicarbonate: 7.6 mmol/L — ABNORMAL LOW (ref 20.0–28.0)
Calcium, Ion: 1.08 mmol/L — ABNORMAL LOW (ref 1.15–1.40)
HCT: 37 % — ABNORMAL LOW (ref 39.0–52.0)
Hemoglobin: 12.6 g/dL — ABNORMAL LOW (ref 13.0–17.0)
O2 Saturation: 100 %
Patient temperature: 98.6
Potassium: 5.8 mmol/L — ABNORMAL HIGH (ref 3.5–5.1)
Sodium: 129 mmol/L — ABNORMAL LOW (ref 135–145)
TCO2: 8 mmol/L — ABNORMAL LOW (ref 22–32)
pCO2 arterial: 15 mmHg — CL (ref 32.0–48.0)
pH, Arterial: 7.313 — ABNORMAL LOW (ref 7.350–7.450)
pO2, Arterial: 255 mmHg — ABNORMAL HIGH (ref 83.0–108.0)

## 2021-03-09 LAB — COMPREHENSIVE METABOLIC PANEL
ALT: 918 U/L — ABNORMAL HIGH (ref 0–44)
AST: 1528 U/L — ABNORMAL HIGH (ref 15–41)
Albumin: 3.7 g/dL (ref 3.5–5.0)
Alkaline Phosphatase: 68 U/L (ref 38–126)
Anion gap: 27 — ABNORMAL HIGH (ref 5–15)
BUN: 26 mg/dL — ABNORMAL HIGH (ref 6–20)
CO2: 7 mmol/L — ABNORMAL LOW (ref 22–32)
Calcium: 9.1 mg/dL (ref 8.9–10.3)
Chloride: 97 mmol/L — ABNORMAL LOW (ref 98–111)
Creatinine, Ser: 1.77 mg/dL — ABNORMAL HIGH (ref 0.61–1.24)
GFR, Estimated: 53 mL/min — ABNORMAL LOW (ref >60)
Glucose, Bld: 98 mg/dL (ref 70–99)
Potassium: 5.6 mmol/L — ABNORMAL HIGH (ref 3.5–5.1)
Sodium: 131 mmol/L — ABNORMAL LOW (ref 135–145)
Total Bilirubin: 5.3 mg/dL — ABNORMAL HIGH (ref 0.3–1.2)
Total Protein: 6.5 g/dL (ref 6.5–8.1)

## 2021-03-09 LAB — CBC WITH DIFFERENTIAL/PLATELET
Abs Immature Granulocytes: 0.05 10*3/uL (ref 0.00–0.07)
Basophils Absolute: 0 10*3/uL (ref 0.0–0.1)
Basophils Relative: 1 %
Eosinophils Absolute: 0 10*3/uL (ref 0.0–0.5)
Eosinophils Relative: 0 %
HCT: 37.4 % — ABNORMAL LOW (ref 39.0–52.0)
Hemoglobin: 11.9 g/dL — ABNORMAL LOW (ref 13.0–17.0)
Immature Granulocytes: 1 %
Lymphocytes Relative: 38 %
Lymphs Abs: 2.8 10*3/uL (ref 0.7–4.0)
MCH: 37.3 pg — ABNORMAL HIGH (ref 26.0–34.0)
MCHC: 31.8 g/dL (ref 30.0–36.0)
MCV: 117.2 fL — ABNORMAL HIGH (ref 80.0–100.0)
Monocytes Absolute: 0.6 10*3/uL (ref 0.1–1.0)
Monocytes Relative: 8 %
Neutro Abs: 3.9 10*3/uL (ref 1.7–7.7)
Neutrophils Relative %: 52 %
Platelets: 294 10*3/uL (ref 150–400)
RBC: 3.19 MIL/uL — ABNORMAL LOW (ref 4.22–5.81)
RDW: 16.8 % — ABNORMAL HIGH (ref 11.5–15.5)
WBC: 7.3 10*3/uL (ref 4.0–10.5)
nRBC: 1.8 % — ABNORMAL HIGH (ref 0.0–0.2)

## 2021-03-09 LAB — HEMOGLOBIN A1C
Hgb A1c MFr Bld: 4.6 % — ABNORMAL LOW (ref 4.8–5.6)
Mean Plasma Glucose: 85.32 mg/dL

## 2021-03-09 LAB — HIV ANTIBODY (ROUTINE TESTING W REFLEX): HIV Screen 4th Generation wRfx: NONREACTIVE

## 2021-03-09 LAB — MAGNESIUM: Magnesium: 1.9 mg/dL (ref 1.7–2.4)

## 2021-03-09 LAB — BLOOD GAS, ARTERIAL
Acid-base deficit: 21.9 mmol/L — ABNORMAL HIGH (ref 0.0–2.0)
Bicarbonate: 5.1 mmol/L — ABNORMAL LOW (ref 20.0–28.0)
Drawn by: 24486
FIO2: 100
O2 Saturation: 95.5 %
Patient temperature: 37
pCO2 arterial: <19 mmHg — CL (ref 32.0–48.0)
pH, Arterial: 7.201 — ABNORMAL LOW (ref 7.350–7.450)
pO2, Arterial: 113 mmHg — ABNORMAL HIGH (ref 83.0–108.0)

## 2021-03-09 LAB — RESP PANEL BY RT-PCR (FLU A&B, COVID) ARPGX2
Influenza A by PCR: NEGATIVE
Influenza B by PCR: NEGATIVE
SARS Coronavirus 2 by RT PCR: NEGATIVE

## 2021-03-09 LAB — HEPATITIS PANEL, ACUTE
HCV Ab: NONREACTIVE
Hep A IgM: NONREACTIVE
Hep B C IgM: NONREACTIVE
Hepatitis B Surface Ag: NONREACTIVE

## 2021-03-09 LAB — TSH: TSH: 5.699 u[IU]/mL — ABNORMAL HIGH (ref 0.350–4.500)

## 2021-03-09 LAB — CK: Total CK: 219 U/L (ref 49–397)

## 2021-03-09 LAB — LIPASE, BLOOD: Lipase: 37 U/L (ref 11–51)

## 2021-03-09 LAB — SALICYLATE LEVEL: Salicylate Lvl: <7 mg/dL — ABNORMAL LOW (ref 7.0–30.0)

## 2021-03-09 LAB — PROTIME-INR
INR: 2.6 — ABNORMAL HIGH (ref 0.8–1.2)
INR: 2.8 — ABNORMAL HIGH (ref 0.8–1.2)
Prothrombin Time: 28.1 seconds — ABNORMAL HIGH (ref 11.4–15.2)
Prothrombin Time: 29.1 seconds — ABNORMAL HIGH (ref 11.4–15.2)

## 2021-03-09 LAB — AMMONIA: Ammonia: 64 umol/L — ABNORMAL HIGH (ref 9–35)

## 2021-03-09 LAB — LACTIC ACID, PLASMA: Lactic Acid, Venous: >9 mmol/L (ref 0.5–1.9)

## 2021-03-09 LAB — ETHANOL: Alcohol, Ethyl (B): <10 mg/dL (ref <10)

## 2021-03-09 LAB — ACETAMINOPHEN LEVEL: Acetaminophen (Tylenol), Serum: <10 ug/mL — ABNORMAL LOW (ref 10–30)

## 2021-03-09 MED ORDER — ONDANSETRON HCL 4 MG PO TABS: 4.0000 mg | ORAL_TABLET | Freq: Four times a day (QID) | ORAL | Status: DC | PRN

## 2021-03-09 MED ORDER — LACTATED RINGERS IV BOLUS
1000.0000 mL | Freq: Once | INTRAVENOUS | Status: AC
  Administered 2021-03-09: 1000 mL via INTRAVENOUS

## 2021-03-09 MED ORDER — DEXTROSE 5 % IV SOLN: 12.5000 mg/kg/h | INTRAVENOUS | Status: DC

## 2021-03-09 MED ORDER — DEXTROSE 5 % IV SOLN
6.2500 mg/kg/h | INTRAVENOUS | Status: DC
  Administered 2021-03-10: 6.25 mg/kg/h via INTRAVENOUS
  Filled 2021-03-09 (×2): qty 90

## 2021-03-09 MED ORDER — ACETYLCYSTEINE LOAD VIA INFUSION
150.0000 mg/kg | Freq: Once | INTRAVENOUS | Status: AC
  Administered 2021-03-10: 14400 mg via INTRAVENOUS
  Filled 2021-03-09: qty 472

## 2021-03-09 MED ORDER — ONDANSETRON HCL 4 MG/2ML IJ SOLN: 4.0000 mg | Freq: Four times a day (QID) | INTRAMUSCULAR | Status: DC | PRN

## 2021-03-09 MED ORDER — SODIUM ZIRCONIUM CYCLOSILICATE 10 G PO PACK
10.0000 g | PACK | Freq: Once | ORAL | Status: AC
  Administered 2021-03-09: 10 g via ORAL
  Filled 2021-03-09: qty 1

## 2021-03-09 MED ORDER — ENOXAPARIN SODIUM 40 MG/0.4ML IJ SOSY: 40.0000 mg | PREFILLED_SYRINGE | INTRAMUSCULAR | Status: DC

## 2021-03-09 MED ORDER — IOHEXOL 350 MG/ML SOLN
75.0000 mL | Freq: Once | INTRAVENOUS | Status: AC | PRN
  Administered 2021-03-09: 75 mL via INTRAVENOUS

## 2021-03-09 MED ORDER — DEXTROSE 5 % IV SOLN
12.5000 mg/kg/h | INTRAVENOUS | Status: DC
  Administered 2021-03-10: 12.5 mg/kg/h via INTRAVENOUS
  Filled 2021-03-09 (×2): qty 90

## 2021-03-09 MED ORDER — SODIUM CHLORIDE 0.9 % IV BOLUS
1000.0000 mL | Freq: Once | INTRAVENOUS | Status: AC
  Administered 2021-03-09: 1000 mL via INTRAVENOUS

## 2021-03-09 MED ORDER — DEXTROSE 5 % IV SOLN: 6.2500 mg/kg/h | INTRAVENOUS | Status: DC

## 2021-03-09 MED ORDER — ACETYLCYSTEINE LOAD VIA INFUSION: 150.0000 mg/kg | Freq: Once | INTRAVENOUS | Status: DC

## 2021-03-09 MED ORDER — LACTATED RINGERS IV SOLN: INTRAVENOUS | Status: DC

## 2021-03-09 NOTE — H&P (Addendum)
Date: 03/10/2021               Patient Name:  Curtis Erickson MRN: [id number]  DOB: [birth year] Age / Sex: 30 y.o., male   PCP: Patient, [name] (Inactive)         Medical Service: Internal Medicine Teaching Service         Attending Physician: Dr. Lucious Groves, DO    First Contact: Dr. Vinetta Bergamo Pager: [phone]  Second Contact: Dr. Allyson Sabal Pager: [phone]       After Hours (After 5p/  First Contact Pager: [phone]  weekends / holidays): Second Contact Pager: [phone]   Chief Complaint: Dehydrated, fatigued  History of Present Illness: Curtis Erickson is a 30 yo male with a PMHx of gout and Hx of sepsis 2/2 pyelonephritis in 2020 presented to the ED c/o nausea, vomiting, SOB, LEE for the past month since initiating Allopurinol. Recently got a PCP because of his recurrent gout. Started allopurinol a month ago. Started feeling dehydrated and developed lower extremity swelling. Saw PCP today and had abnormal labs, told to stop allopurinol today and come to the ED. Patient reports anorexia, nausea and some vomiting. Some lightheadedness. No changes in his vision. Sometimes feels his heart is beating "really hard", noticed over the past month. Patient reports progressive dyspnea over the last week. Has been drinking a lot of water. However, endorses decreased urine output; denies dysuria but states brown color urine. Last gout flare was in October 2022 of his left knee. He was treated with colchicine. No steroids. However, has taken prednisone in the past. Denies fevers. Constantly feels cold. Sometimes has night sweats. Denies body aches. No recent travel. Up to date with immunizations. Denies significant use of tylenol or ibuprofen, not in excess. Last use was in October. No OTC supplements or vitamins. Has been sedentary lifestyle outside of work since his symptoms started a month ago. History of kidney stones, removed surgically in 2016. Appendectomy in 2010.       Meds:  Current Meds   Medication Sig   allopurinol (ZYLOPRIM) 100 MG tablet Take 100 mg by mouth daily.     Allergies: Allergies as of 03/09/2021   (No Known Allergies)   Past Medical History:  Diagnosis Date   Hypertension    Kidney stone     Family History: Grandmother had arthritis. Mother has had gallstones, retains fluids but no history of heart failure. Father has atrial fibrillation. No known family cancers, autoimmune disorders, thyroid disorders.   Social History: Lives with his girlfriend and daughter, she is 77. 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 Drinks alcohol sometimes, previously 3 drinks a day, wine coolers For many years. Last drank a week ago. No liquor typically. 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 Denies vaping. Denies illicit drug use.   Review of Systems: A complete ROS was negative except as per HPI.   Physical Exam: Blood pressure (!) 139/56, pulse (!) 119, temperature 99.7 F (37.6 C), resp. rate (!) 32, SpO2 93 %. Physical Exam Constitutional:      Appearance: He is ill-appearing.     Interventions: Nasal cannula in place.     Comments: Anasarca noted  HENT:     Head: Normocephalic and atraumatic.     Mouth/Throat:     Mouth: Mucous membranes are pale and dry.     Dentition: Abnormal dentition.  Eyes:     General: Scleral icterus  present.     Extraocular Movements: Extraocular movements intact.  Cardiovascular:     Rate and Rhythm: Regular rhythm. Tachycardia present.     Pulses:          Radial pulses are 1+ on the right side and 1+ on the left side.     Heart sounds: Normal heart sounds.  Pulmonary:     Effort: Tachypnea present.     Comments: Increased work of breathing noted. Abdominal:     Palpations: Abdomen is soft.     Tenderness: There is abdominal tenderness in the right upper quadrant. There is no right CVA tenderness, left CVA tenderness or guarding. Positive signs include Murphy's sign.  Musculoskeletal:      Right lower leg: 1+ Edema present.     Left lower leg: 1+ Edema present.     Comments: Tattoos noted on the back; bilateral arms   Lymphadenopathy:     Cervical:     Right cervical: No superficial or deep cervical adenopathy.    Left cervical: No superficial or deep cervical adenopathy.  Skin:    General: Skin is warm and dry.     Coloration: Skin is jaundiced.     Comments: NO rash noted  Neurological:     General: No focal deficit present.     Mental Status: He is alert and oriented to person, place, and time.  Psychiatric:        Behavior: Behavior is cooperative.     Comments: Patient visibly in discomfort due to tachypnea      CBC Latest Ref Rng & Units 03/09/2021 03/09/2021 09/19/2019  WBC 4.0 - 10.5 K/uL - 7.3 2.1(L)  Hemoglobin 13.0 - 17.0 g/dL 12.6(L) 11.9(L) 10.8(L)  Hematocrit 39.0 - 52.0 % 37.0(L) 37.4(L) 30.8(L)  Platelets 150 - 400 K/uL - 294 130(L)   CMP Latest Ref Rng & Units 03/09/2021 03/09/2021 03/09/2021  Glucose 70 - 99 mg/dL - - 98  BUN 6 - 20 mg/dL - - 26(H)  Creatinine 0.61 - 1.24 mg/dL - - 1.77(H)  Sodium 135 - 145 mmol/L - 129(L) 131(L)  Potassium 3.5 - 5.1 mmol/L - 5.8(H) 5.6(H)  Chloride 98 - 111 mmol/L - - 97(L)  CO2 22 - 32 mmol/L - - 7(L)  Calcium 8.9 - 10.3 mg/dL - - 9.1  Total Protein 6.5 - 8.1 g/dL 6.3(L) - 6.5  Total Bilirubin 0.3 - 1.2 mg/dL 5.8(H) - 5.3(H)  Alkaline Phos 38 - 126 U/L 60 - 68  AST 15 - 41 U/L 1,378(H) - 1,528(H)  ALT 0 - 44 U/L 880(H) - 918(H)   Hepatic Function Latest Ref Rng & Units 03/09/2021 03/09/2021 09/18/2019  Total Protein 6.5 - 8.1 g/dL 6.3(L) 6.5 6.1(L)  Albumin 3.5 - 5.0 g/dL 3.5 3.7 3.3(L)  AST 15 - 41 U/L 1,378(H) 1,528(H) 43(H)  ALT 0 - 44 U/L 880(H) 918(H) 64(H)  Alk Phosphatase 38 - 126 U/L 60 68 58  Total Bilirubin 0.3 - 1.2 mg/dL 5.8(H) 5.3(H) 1.0  Bilirubin, Direct 0.0 - 0.2 mg/dL 1.6(H) - -    EKG: personally reviewed my interpretation is sinus tachycardia  CT ABDOMEN PELVIS W CONTRAST  Result  Date: 03/09/2021 CLINICAL DATA:  Mid abdominal pain EXAM: CT ABDOMEN AND PELVIS WITH CONTRAST TECHNIQUE: Multidetector CT imaging of the abdomen and pelvis was performed using the standard protocol following bolus administration of intravenous contrast. CONTRAST:  4m OMNIPAQUE IOHEXOL 350 MG/ML SOLN COMPARISON:  CT from 09/17/2019, ultrasound from 09/18/2019 FINDINGS: Lower chest: No acute  abnormality. Hepatobiliary: Fatty infiltration of the liver is noted. Gallbladder is well distended without discrete wall thickening or pericholecystic fluid. Pancreas: Unremarkable. No pancreatic ductal dilatation or surrounding inflammatory changes. Spleen: Normal in size without focal abnormality. Adrenals/Urinary Tract: Adrenal glands are within normal limits. Right kidney is well visualize without renal calculi. Normal enhancement pattern is seen. No obstructive changes are noted. The bladder is partially distended. The left kidney demonstrates some patchy enhancement compared to that on the right which may represent some early changes of pyelonephritis. Correlate with laboratory values. No obstructive changes are seen. Stomach/Bowel: Colon shows no obstructive or inflammatory changes. The appendix has been surgically removed. The small bowel and stomach are within normal limits. Vascular/Lymphatic: No significant vascular findings are present. No enlarged abdominal or pelvic lymph nodes. Reproductive: Prostate is unremarkable. Other: Mild free fluid is noted within the pelvis and surrounding the liver new from the prior exam. Mild changes of anasarca are noted as well. Musculoskeletal: No acute or significant osseous findings. IMPRESSION: Patchy enhancement within the left kidney suspicious for pyelonephritis. Correlate with laboratory values. Mild free fluid within the abdomen and pelvis as this is of uncertain etiology. Mild changes of anasarca are noted as well. Fatty infiltration of the liver. Electronically Signed    By: Inez Catalina M.D.   On: 03/09/2021 18:39   DG Chest Portable 1 View  Result Date: 03/09/2021 CLINICAL DATA:  Leg swelling and fever.  Dehydration. EXAM: PORTABLE CHEST 1 VIEW COMPARISON:  09/17/2019 FINDINGS: Numerous leads and wires project over the chest. Apical lordotic positioning. Midline trachea. Mild cardiomegaly. No pleural effusion or pneumothorax. No congestive failure. Clear lungs. IMPRESSION: Mild cardiomegaly, without congestive failure or acute disease. Electronically Signed   By: Abigail Miyamoto M.D.   On: 03/09/2021 18:08     Assessment & Plan by Problem: Principal Problem:   Acute hepatitis Active Problems:   Alcoholic fatty liver   Acute hypoxemic respiratory failure (HCC)   Gout   High anion gap metabolic acidosis   Respiratory alkalosis   Sinus tachycardia   AKI (acute kidney injury) ([place])   Lactic acidosis   Anasarca   #Acute hepatitis #Alcoholic fatty liver Patient was started on allopurinol 1 month ago due to recurrent gout; last episode in October 2022 and was treated with colchicine. Symptom onset began after initiation of Allopurinol. Patient developed nausea, vomiting, LLE, progressive SOB, and fatigue. Initial labs in the ED reveal elevated LFTs AST/ALT 1,528/918; Alk phos WNL; T bili elevated at 5.3. Lipase WNL; abnormal coags PT-INR 28.1-2.6; hepatitis panel negative. Allopurinol adverse reaction include hepatic failure. High suspicion for drug induced liver failure due to syx onset coincides with initiation of allopurinol. One month ago, patient was seen by PCP and LFTs were WNL. However with drug induced liver failure, eosinophilia and fever are commonly seen. Patient, however is afebrile and not eosinophilia appreciated on labs. Acetaminophen toxicity was on the differential, however patient denies use of acetaminophen recently and Acetaminophen levels were WNL. Although N-acetylcysteine is used for the treatment of acetaminophen toxicity, it may be  beneficial in patients with in indeterminate cause of acute liver failure. Alcoholic hepatitis also on the differential and Patient admits to alcohol use, wine coolers x3/day for several years but less than that recently; last drink 1 week ago. CT of the abd/pel significant for fatty liver. Patient is jaundiced with RUQ tenderness upon palpation, reports anorexia but afebrile. However, the acuity of symptom onset and frequency of patients drinking habits makes this less  likely. Etoh levels <10m on admission. Abnormal LFTs raise supsicion for viral hepatitis etiology. Patient denies recent travel. Hepatitis panel negative; therefore hepatitis A, B and C ruled out. Will obtain other viral etiologies such as EBV, CMV, and HSV. There is a suspicion budd chiari syndrome in the setting of elevated transaminases, jaundice noted on PE, and an elevated prothrombin time/international normalized ratio; patient however, shows no sign of encephalopathy. Although no significant blood flow abnormalities noted on CT. Will obtain doppler [place] the liver to rule out. Other causes of acute liver failure include Wilson's disease, no kayser fleischer ring noted on PE. Will check ceruloplasmin levels. Autoimmune hepatitis is a possible etiology. Patient denies recent weight changes, rash or family Hx of autoimmune disease. Will obtain autoimmune workup. Patient very forthcoming with social hx and denies illicit drug use. Low suspicion for toxic ingestion such as ethylene glycol or toxic mushrooms. Salicylates levels were WNL. It is reassuring that patient is not showing any sign of hepatic encephalopathy, patient is A&Ox3. MELD score of 31 indicating a 52.6% estimated mortality in the next 3 months. GI has been consulted and they are on board. GI recommended consult to liver transplant center.  Spoke with hepatologist, [facility], made aware of the patient. Dr. SScarlette Shortsdoes not recommend transplant at this time.  However, if patients mentation declines and/or liver function worsens, will reconsider. Dr. SScarlette Shortsdoes however recommend liver biospy, specifically prior to steroid use if possible.Their recommendations greatly appreciated. --Ceruloplasmin pending --TSH and free T4 pending --HIV pending --HSV pending --UDS pending --[place] doopler pending --Autoimmune workup pending --Start NAC empirically for now. -- Consider steroids if LFTs and coags worsen --Stop allopurinol and all other hepatotoxins --Trend INR, every 12 hours --Closely monitor mental status   #Acute anion gap metabolic acidosis with respiratory alkalosis compensation #Lactic acidosis #Acute hypoxemic respiratory failure #Sinus Tachycardia Patient presented with sustained tachycardia in the 120s and tachypneic up into the 40s. VBG drawn in the ED significant for anion gap of 27. Pure metabolic acidosis. Patient tachypnea likely 2/2 respiratory alkalosis compensation. Patient was found to have elevated lactic acid levels >8. Likely culprit for metabolic acidosis. IN the setting of acute liver injury, decrease clearance of lactate by the liver can result in elevated levels seen. CXR significant for mild cardiomegaly, without active cardiopulmonary disease. Initial EKG revealed sinus tachycardia. Patient desaturated into the 70s-80s requiring HFNC and once oxygenation levels improved he was switched to [place]. Suspicion for PE and a CTA was considered. However, patient's O2 sats did improve with supplemental O2, no pleuritic chest noted; lungs clear on ascultation. EKG was reassuring. PCCM was consulted due to worrisome increased work of breathing, O2 desaturation requiring O2 supplementation. PCCM on board; recommendations appreciated. --Repeat EKG --Echo pending; rule out cardiogenic cause --Avoid BiPAP or HFNC; only use if patient begins persistently desaturating (<88%) --Allow for compensatory tachypnea --Consider giving Bicarb to  improve acidosis  #Acute kidney injury Patient endorse dehydration despite increased fluid intake. He endorse decreased urine output and brown color urine. Patient has received 3L of IV fluids since admission. Patient has not urinated. Cr level elevated at 1.77 on admission. Patient as a past Hx of pyelonephritis in 2020. There is a suspicion for pyelonephritis, CT of abd/pel shows the bladder is partially distended. The left kidney demonstrates some patchy enhancement compared to that on the right which may represent some early changes of pyelonephritis. NO obstructive changes were seen. However, patient is afebrile, no  leukocytosis noted on CBC. Patient denies dysuria, though reports decreased urine output. On PE, no CVA tenderness noted. --UA pending Bladder scan Q8H; In & out if >320m  #Gout  Patient has Hx of recurrent gout; he has had 3 episodes in the last year. Treated with colchicine and prednisone in the past. Last episode was October 2022 of the left knee. Patient was then started on allopurinol 1066mdaily. He has been compliant with the medication and has been taking it for the past month. Due to recent onset of symptoms (acute hepatitis). Medication has been held. Patient reports last dose, this morning, prior to admission. Patient was advised to discontinue due to abnormal LFTs on labs during clinic visit. Patient currently not experiencing a gout flare. --Discontinue Allopurinol --Consider colchicine and/or prednisone therapy if flare were to develop   Chronic macrocytic anemia #Hx Iron deficiency anemia #Hx vitamin B12 anemia #Hx Folate deficiency Once patient is stable. Can evaluate for etiology of anemia.    Dispo: Admit patient to Observation with expected length of stay less than 2 midnights.  Diet: Clear liquids VTE: Lovenox Code Status: FULL   Signed: ArTimothy LassoMD 03/10/2021, 12:42 AM  Pager: [phone] 5pm on weekdays and 1pm on weekends: On  Call pager: [phone]

## 2021-03-09 NOTE — ED Notes (Signed)
Called [facility] to get a STAT ABG and they stated they were on their way

## 2021-03-09 NOTE — Consult Note (Signed)
NAME:  Curtis Erickson, MRN:  [id number], DOB:  June 17, 1990, LOS: 0 ADMISSION DATE:  03/09/2021, CONSULTATION DATE:  03/09/21 REFERRING MD:  Heber Corning CHIEF COMPLAINT:  Liver injury   History of Present Illness:  Curtis Erickson is a 30 y.o. male who has no significant PMH besides gout.  He presented to [facility] ED 12/1 with weakness, fatigue, LE edema, nausea, vomiting for roughly 1 week.  Also noticed that his respiratory rate had increased for past several days. Last BM 1 day before presentation, diarrhea like consistency. He apparently started to feel very dehydrated and had no appetite for food or drink so he went to PCP 12/1 where he had abnormal labs including concern for acute liver failure and was subsequently sent to ED.  He was started on Allopurinol roughly 1 month ago for gout flare. This was stopped 12/1. Denies any drug use.  Does drink alcohol, normally wine coolers but last drink was over 1 week ago.  No additional medications.  No exposures to known sick contacts.  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   In ED, he was found to have multiple metabolic derangements including metabolic acidosis and most notably concern for early liver failure / drug induced liver injury.  Hepatitis panel/acetaminophen/salicylate/ethanol/CK all negative. He was admitted by IMTS with presumed DILI from Allopurinol.  GI was called and recommended seeking transfer to tertiary liver transplant center if possible.  If not, they will formally see him AM 12/2.  Due to ongoing tachypnea, PCCM asked to see in consultation.  He is currently on room air has compensatory tachypnea / respiratory alkalosis 2/2 metabolic acidosis.  Pertinent  Medical History:  has Severe sepsis without septic shock (CODE) ([place]); Acute pyelonephritis; B12 deficiency; Folate deficiency; Dietary iron deficiency; ETOH abuse; Pancytopenia ([place]); Alcoholic fatty liver; Acute hepatitis; Acute hypoxemic  respiratory failure ([place]); Gout; High anion gap metabolic acidosis; Respiratory alkalosis; Sinus tachycardia; and AKI (acute kidney injury) ([place]) on their problem list.  Significant Hospital Events: Including procedures, antibiotic start and stop dates in addition to other pertinent events   12/1 admit.  Interim History / Subjective:  On room air. Remains tachycardic and tachypneic but mental status is good. He is able to carry out full conversation appropriately. Appropriate compensation for metabolic acidosis at this point.  Objective:  Blood pressure (!) 139/55, pulse (!) 119, temperature 99.7 F (37.6 C), resp. rate (!) 28, SpO2 100 %.        Intake/Output Summary (Last 24 hours) at 03/09/2021 2238 Last data filed at 03/09/2021 2057 Gross per 24 hour  Intake 2000 ml  Output --  Net 2000 ml   There were no vitals filed for this visit.  Examination: General: Young adult male, critically ill. Neuro: A&O x 3, no deficits. HEENT: [place]/AT. Mild scleral icterus. EOMI. Cardiovascular: Tachy, regular, no M/R/G.  Lungs: Respirations even and unlabored.  CTA bilaterally, No W/R/R. Abdomen: BS x 4, soft, NT/ND.  Musculoskeletal: No gross deformities, 3+ edema.  Skin: Intact, warm, no rashes.  Labs/imaging personally reviewed:  CT A/P 12/1 > ? Pyelo on left.  Mild free fluid in abdomen.  Fatty infiltration of liver. US liver doppler 12/1 >  Assessment & Plan:   DILI - presumed 2/2 Allopurinol.  Likely not true acute liver failure given his normal mentation but is at risk for progression. Coagulopathy - 2/2 above. - GI consulted, have discussed with primary team and they will  see pt in AM. - Agree with getting tertiary center on board (primary team has spoken with [facility] who is aware of pt if he were to deteriorate at all, in the meantime they are recommending conservative management). - Start NAC empirically for now. - Consider steroids if no changes over next day or two. - Continue  fluids. - Defer hepatic workup to GI.  Primary team has also discussed with [facility] hepatology regarding additional labs needed and possible liver biopsy in AM 12/2. - Check volatile alcohol panel for completeness. - Trend coags. - If mental status gets worse, primary team is aware to call us back for transfer to ICU.  AGMA - multifactorial in setting AKI, lactic acidosis from decreased liver clearance in setting DILI, critical illness. AKI. Hyponatremia. Hyperkalemia. - Repeat ABG at 0200, if worse then add bicarb drip. - Continue fluids. - Trend lactate. - 10g Lokelma. - Trend BMP.  Respiratory Alkalosis - 2/2 above, compensating appropriately. - Continue supportive care. - Follow ABG. - If mental status gets worse, primary team is aware to call us back for transfer to ICU.   Best practice (evaluated daily):  Per primary.  Labs   CBC: Recent Labs  Lab 03/09/21 1643 03/09/21 1804  WBC 7.3  --   NEUTROABS 3.9  --   HGB 11.9* 12.6*  HCT 37.4* 37.0*  MCV 117.2*  --   PLT 294  --     Basic Metabolic Panel: Recent Labs  Lab 03/09/21 1643 03/09/21 1804  NA 131* 129*  K 5.6* 5.8*  CL 97*  --   CO2 7*  --   GLUCOSE 98  --   BUN 26*  --   CREATININE 1.77*  --   CALCIUM 9.1  --   MG 1.9  --    GFR: CrCl cannot be calculated (Unknown ideal weight.). Recent Labs  Lab 03/09/21 1643  WBC 7.3    Liver Function Tests: Recent Labs  Lab 03/09/21 1643  AST 1,528*  ALT 918*  ALKPHOS 68  BILITOT 5.3*  PROT 6.5  ALBUMIN 3.7   Recent Labs  Lab 03/09/21 1643  LIPASE 37   Recent Labs  Lab 03/09/21 1643  AMMONIA 64*    ABG    Component Value Date/Time   PHART 7.201 (L) 03/09/2021 2150   PCO2ART <19.0 (LL) 03/09/2021 2150   PO2ART 113 (H) 03/09/2021 2150   HCO3 5.1 (L) 03/09/2021 2150   TCO2 8 (L) 03/09/2021 1804   ACIDBASEDEF 21.9 (H) 03/09/2021 2150   O2SAT 95.5 03/09/2021 2150     Coagulation Profile: Recent Labs  Lab 03/09/21 1735  03/09/21 2111  INR 2.6* 2.8*    Cardiac Enzymes: Recent Labs  Lab 03/09/21 1643  CKTOTAL 219    HbA1C: Hgb A1c MFr Bld  Date/Time Value Ref Range Status  03/09/2021 08:50 PM 4.6 (L) 4.8 - 5.6 % Final    Comment:    (NOTE) Pre diabetes:          5.7%-6.4%  Diabetes:              >6.4%  Glycemic control for   <7.0% adults with diabetes   09/17/2019 07:02 PM 4.9 4.8 - 5.6 % Final    Comment:    (NOTE) Pre diabetes:          5.7%-6.4%  Diabetes:              >6.4%  Glycemic control for   <7.0% adults with diabetes  CBG: No results for input(s): GLUCAP in the last 168 hours.  Review of Systems:   All negative; except for those that are bolded, which indicate positives.  Constitutional: weight loss, weight gain, night sweats, fevers, chills, fatigue, weakness.  HEENT: headaches, sore throat, sneezing, nasal congestion, post nasal drip, difficulty swallowing, tooth/dental problems, visual complaints, visual changes, ear aches. Neuro: difficulty with speech, weakness, numbness, ataxia. CV:  chest pain, orthopnea, PND, swelling in lower extremities, dizziness, palpitations, syncope.  Resp: cough, hemoptysis, dyspnea, wheezing. GI: heartburn, indigestion, abdominal pain, nausea, vomiting, diarrhea, constipation, change in bowel habits, loss of appetite, hematemesis, melena, hematochezia.  GU: dysuria, change in color of urine, urgency or frequency, flank pain, hematuria. MSK: joint pain or swelling, decreased range of motion. Psych: change in mood or affect, depression, anxiety, suicidal ideations, homicidal ideations. Skin: rash, itching, bruising.   Past Medical History:  He,  has a past medical history of Hypertension and Kidney stone.   Surgical History:   Past Surgical History:  Procedure Laterality Date   APPENDECTOMY     KIDNEY STONE SURGERY       Social History:   reports that he has never smoked. He has never used smokeless tobacco. He reports that  he does not currently use alcohol. He reports that he does not currently use drugs.   Family History:  His family history is not on file.   Allergies No Known Allergies   Home Medications  Prior to Admission medications   Medication Sig Start Date End Date Taking? Authorizing Provider  allopurinol (ZYLOPRIM) 100 MG tablet Take 100 mg by mouth daily. 02/01/21  Yes [provider]  ferrous sulfate 325 (65 FE) MG tablet Take 1 tablet (325 mg total) by mouth 2 (two) times daily with a meal. Patient not taking: Reported on 03/09/2021 09/19/19   Dhungel, Theda Belfast, MD  folic acid (FOLVITE) 1 MG tablet Take 1 tablet (1 mg total) by mouth daily. Patient not taking: Reported on 03/09/2021 09/20/19   Dhungel, Theda Belfast, MD  Multiple Vitamin (MULTIVITAMIN WITH MINERALS) TABS tablet Take 1 tablet by mouth daily. Patient not taking: Reported on 03/09/2021 09/20/19   Dhungel, Theda Belfast, MD  thiamine 100 MG tablet Take 1 tablet (100 mg total) by mouth daily. Patient not taking: Reported on 03/09/2021 09/20/19   Dhungel, Theda Belfast, MD  vitamin B-12 1000 MCG tablet Take 1 tablet (1,000 mcg total) by mouth daily. Patient not taking: Reported on 03/09/2021 09/20/19   Eddie North, MD     Critical care time: 50 minutes.   Rutherford Guys, PA - [facility] For pager details, please see AMION or use Epic chat  After 1900, please call [facility] for cross coverage needs 03/09/2021, 10:38 PM

## 2021-03-09 NOTE — ED Triage Notes (Signed)
Patient states he was sent to [facility] from [facility] for evaluation and treatment of dehydration. Patient complains of generalized weakness and lower extremity edema. Patient is alert, oriented, and in no apparent distress at this time.

## 2021-03-09 NOTE — ED Notes (Signed)
Notified Dr Freida Busman of pts Sa02. Placed pt on 6L 02 per [place], sa02 stayed in upper 70s- low 60s. Placed pt on 15L 02 er [facility] Sa02 in upper 70s. Dr Freida Busman is aware.

## 2021-03-09 NOTE — ED Provider Notes (Signed)
I saw and evaluated the patient, reviewed the resident's note and I agree with the findings and plan.  EKG Interpretation  Date/Time:  Thursday March 09 2021 15:16:22 EST Ventricular Rate:  123 PR Interval:  126 QRS Duration: 82 QT Interval:  312 QTC Calculation: 446 R Axis:   32 Text Interpretation: Sinus tachycardia with Fusion complexes Otherwise normal ECG Confirmed by Lorre Nick ([id number]) on 03/09/2021 4:35:45 PM   30 year old male presents with lethargy and swelling to his extremities.  Seen at outside facility and was found to be dehydrated with concern for acute kidney injury.  Patient also endorses dark urine.  Concern for possible rhabdomyolysis.  Also concern for jaundice and hepatic process.  Evaluation is pending at this time but patient will likely require admission   Lorre Nick, MD 03/09/21 1718

## 2021-03-09 NOTE — ED Provider Notes (Signed)
[facility] EMERGENCY DEPARTMENT Provider Note   CSN: [id number] Arrival date & time: 03/09/21  1506     History Chief Complaint  Patient presents with   Abnormal Lab    Curtis Erickson is a 30 y.o. male.  The history is provided by the patient and medical records.  Weakness Severity:  Moderate Onset quality:  Gradual Duration:  1 day Timing:  Constant Progression:  Worsening Chronicity:  New Context comment:  Hx etoh abuse, started on allopurinol for gout 1 month ago, outpatient labs earlier today revealed AKI w/ Cr 1.7 Relieved by:  Nothing Worsened by:  Nothing Ineffective treatments:  Drinking fluids Associated symptoms: nausea and shortness of breath   Associated symptoms: no abdominal pain, no arthralgias, no chest pain, no cough, no dysuria, no fever, no seizures and no vomiting       Past Medical History:  Diagnosis Date   Hypertension    Kidney stone     Patient Active Problem List   Diagnosis Date Noted   Acute liver failure 03/09/2021   Acute hypoxemic respiratory failure ([place]) 03/09/2021   Gout 03/09/2021   Severe sepsis without septic shock (CODE) ([place]) 09/19/2019   Acute pyelonephritis 09/19/2019   B12 deficiency 09/19/2019   Folate deficiency 09/19/2019   Dietary iron deficiency 09/19/2019   ETOH abuse 09/19/2019   Pancytopenia ([place]) [id number]   Alcoholic fatty liver [id number]    Past Surgical History:  Procedure Laterality Date   APPENDECTOMY     KIDNEY STONE SURGERY         No family history on file.  Social History   Tobacco Use   Smoking status: Never   Smokeless tobacco: Never  Vaping Use   Vaping Use: Never used  Substance Use Topics   Alcohol use: Not Currently   Drug use: Not Currently    Home Medications Prior to Admission medications   Medication Sig Start Date End Date Taking? Authorizing Provider  ferrous sulfate 325 (65 FE) MG tablet Take 1 tablet (325 mg total) by mouth 2 (two) times daily  with a meal. 09/19/19   Dhungel, Nishant, MD  folic acid (FOLVITE) 1 MG tablet Take 1 tablet (1 mg total) by mouth daily. 09/20/19   Dhungel, Flonnie Overman, MD  Multiple Vitamin (MULTIVITAMIN WITH MINERALS) TABS tablet Take 1 tablet by mouth daily. 09/20/19   Dhungel, Flonnie Overman, MD  thiamine 100 MG tablet Take 1 tablet (100 mg total) by mouth daily. 09/20/19   Dhungel, Flonnie Overman, MD  vitamin B-12 1000 MCG tablet Take 1 tablet (1,000 mcg total) by mouth daily. 09/20/19   Dhungel, Flonnie Overman, MD    Allergies    Patient has no known allergies.  Review of Systems   Review of Systems  Constitutional:  Positive for activity change and fatigue. Negative for chills and fever.  HENT:  Negative for ear pain and sore throat.   Eyes:  Negative for pain and visual disturbance.  Respiratory:  Positive for shortness of breath. Negative for cough.   Cardiovascular:  Positive for leg swelling. Negative for chest pain and palpitations.  Gastrointestinal:  Positive for nausea. Negative for abdominal pain and vomiting.  Genitourinary:  Negative for dysuria and hematuria.  Musculoskeletal:  Negative for arthralgias and back pain.  Skin:  Negative for color change and rash.  Neurological:  Positive for weakness. Negative for seizures and syncope.  All other systems reviewed and are negative.  Physical Exam Updated Vital Signs BP (!) 139/55   Pulse Marland Kitchen)  119   Temp 99.7 F (37.6 C)   Resp (!) 28   SpO2 100%   Physical Exam Vitals and nursing note reviewed.  Constitutional:      General: He is not in acute distress.    Appearance: He is well-developed. He is ill-appearing.  HENT:     Head: Normocephalic and atraumatic.     Right Ear: External ear normal.     Left Ear: External ear normal.     Nose: Nose normal. No congestion.     Mouth/Throat:     Mouth: Mucous membranes are moist.     Pharynx: Oropharynx is clear. No posterior oropharyngeal erythema.  Eyes:     General: Scleral icterus present.     Extraocular  Movements: Extraocular movements intact.     Conjunctiva/sclera: Conjunctivae normal.     Pupils: Pupils are equal, round, and reactive to light.  Cardiovascular:     Rate and Rhythm: Regular rhythm. Tachycardia present.     Pulses: Normal pulses.     Heart sounds: No murmur heard. Pulmonary:     Effort: Pulmonary effort is normal. No respiratory distress.     Breath sounds: Normal breath sounds. No wheezing, rhonchi or rales.  Abdominal:     General: Abdomen is flat. Bowel sounds are normal. There is distension.     Palpations: Abdomen is soft.     Tenderness: There is no abdominal tenderness. There is no right CVA tenderness, left CVA tenderness, guarding or rebound.  Musculoskeletal:        General: No swelling, tenderness or deformity. Normal range of motion.     Cervical back: Normal range of motion and neck supple. No rigidity.     Right lower leg: Edema present.     Left lower leg: Edema present.  Skin:    General: Skin is warm and dry.     Capillary Refill: Capillary refill takes less than 2 seconds.     Coloration: Skin is jaundiced.     Findings: No rash.  Neurological:     General: No focal deficit present.     Mental Status: He is alert and oriented to person, place, and time.     Cranial Nerves: No cranial nerve deficit.     Sensory: No sensory deficit.     Motor: No weakness.     Coordination: Coordination normal.     Gait: Gait normal.  Psychiatric:        Mood and Affect: Mood normal.    ED Results / Procedures / Treatments   Labs (all labs ordered are listed, but only abnormal results are displayed) Labs Reviewed  CBC WITH DIFFERENTIAL/PLATELET - Abnormal; Notable for the following components:      Result Value   RBC 3.19 (*)    Hemoglobin 11.9 (*)    HCT 37.4 (*)    MCV 117.2 (*)    MCH 37.3 (*)    RDW 16.8 (*)    nRBC 1.8 (*)    All other components within normal limits  COMPREHENSIVE METABOLIC PANEL - Abnormal; Notable for the following  components:   Sodium 131 (*)    Potassium 5.6 (*)    Chloride 97 (*)    CO2 7 (*)    BUN 26 (*)    Creatinine, Ser 1.77 (*)    AST 1,528 (*)    ALT 918 (*)    Total Bilirubin 5.3 (*)    GFR, Estimated 53 (*)    Anion gap 27 (*)  All other components within normal limits  AMMONIA - Abnormal; Notable for the following components:   Ammonia 64 (*)    All other components within normal limits  ACETAMINOPHEN LEVEL - Abnormal; Notable for the following components:   Acetaminophen (Tylenol), Serum <10 (*)    All other components within normal limits  SALICYLATE LEVEL - Abnormal; Notable for the following components:   Salicylate Lvl <7.0 (*)    All other components within normal limits  PROTIME-INR - Abnormal; Notable for the following components:   Prothrombin Time 28.1 (*)    INR 2.6 (*)    All other components within normal limits  I-STAT ARTERIAL BLOOD GAS, ED - Abnormal; Notable for the following components:   pH, Arterial 7.313 (*)    pCO2 arterial 15.0 (*)    pO2, Arterial 255 (*)    Bicarbonate 7.6 (*)    TCO2 8 (*)    Acid-base deficit 16.0 (*)    Sodium 129 (*)    Potassium 5.8 (*)    Calcium, Ion 1.08 (*)    HCT 37.0 (*)    Hemoglobin 12.6 (*)    All other components within normal limits  RESP PANEL BY RT-PCR (FLU A&B, COVID) ARPGX2  CK  LIPASE, BLOOD  MAGNESIUM  ETHANOL  HEPATITIS PANEL, ACUTE  URINALYSIS, ROUTINE W REFLEX MICROSCOPIC  HIV ANTIBODY (ROUTINE TESTING W REFLEX)  HEPATIC FUNCTION PANEL  PROTIME-INR  HEPATIC FUNCTION PANEL  ANA W/REFLEX IF POSITIVE  CERULOPLASMIN  ALPHA-1-ANTITRYPSIN  LACTATE DEHYDROGENASE  TSH  HEMOGLOBIN A1C  BLOOD GAS, ARTERIAL  GAMMA GT    EKG EKG Interpretation  Date/Time:  Thursday March 09 2021 15:16:22 EST Ventricular Rate:  123 PR Interval:  126 QRS Duration: 82 QT Interval:  312 QTC Calculation: 446 R Axis:   32 Text Interpretation: Sinus tachycardia with Fusion complexes Otherwise normal ECG  Confirmed by Lorre NickAllen, Anthony ([id number]) on 03/09/2021 4:04:18 PM  Radiology CT ABDOMEN PELVIS W CONTRAST  Result Date: 03/09/2021 CLINICAL DATA:  Mid abdominal pain EXAM: CT ABDOMEN AND PELVIS WITH CONTRAST TECHNIQUE: Multidetector CT imaging of the abdomen and pelvis was performed using the standard protocol following bolus administration of intravenous contrast. CONTRAST:  75mL OMNIPAQUE IOHEXOL 350 MG/ML SOLN COMPARISON:  CT from 09/17/2019, ultrasound from 09/18/2019 FINDINGS: Lower chest: No acute abnormality. Hepatobiliary: Fatty infiltration of the liver is noted. Gallbladder is well distended without discrete wall thickening or pericholecystic fluid. Pancreas: Unremarkable. No pancreatic ductal dilatation or surrounding inflammatory changes. Spleen: Normal in size without focal abnormality. Adrenals/Urinary Tract: Adrenal glands are within normal limits. Right kidney is well visualize without renal calculi. Normal enhancement pattern is seen. No obstructive changes are noted. The bladder is partially distended. The left kidney demonstrates some patchy enhancement compared to that on the right which may represent some early changes of pyelonephritis. Correlate with laboratory values. No obstructive changes are seen. Stomach/Bowel: Colon shows no obstructive or inflammatory changes. The appendix has been surgically removed. The small bowel and stomach are within normal limits. Vascular/Lymphatic: No significant vascular findings are present. No enlarged abdominal or pelvic lymph nodes. Reproductive: Prostate is unremarkable. Other: Mild free fluid is noted within the pelvis and surrounding the liver new from the prior exam. Mild changes of anasarca are noted as well. Musculoskeletal: No acute or significant osseous findings. IMPRESSION: Patchy enhancement within the left kidney suspicious for pyelonephritis. Correlate with laboratory values. Mild free fluid within the abdomen and pelvis as this is of uncertain  etiology. Mild changes of anasarca  are noted as well. Fatty infiltration of the liver. Electronically Signed   By: Alcide Clever M.D.   On: 03/09/2021 18:39   DG Chest Portable 1 View  Result Date: 03/09/2021 CLINICAL DATA:  Leg swelling and fever.  Dehydration. EXAM: PORTABLE CHEST 1 VIEW COMPARISON:  09/17/2019 FINDINGS: Numerous leads and wires project over the chest. Apical lordotic positioning. Midline trachea. Mild cardiomegaly. No pleural effusion or pneumothorax. No congestive failure. Clear lungs. IMPRESSION: Mild cardiomegaly, without congestive failure or acute disease. Electronically Signed   By: Jeronimo Greaves M.D.   On: 03/09/2021 18:08    Procedures Procedures   Medications Ordered in ED Medications  enoxaparin (LOVENOX) injection 40 mg (has no administration in time range)  lactated ringers bolus 1,000 mL (has no administration in time range)  ondansetron (ZOFRAN) tablet 4 mg (has no administration in time range)    Or  ondansetron (ZOFRAN) injection 4 mg (has no administration in time range)  sodium chloride 0.9 % bolus 1,000 mL (0 mLs Intravenous Stopped 03/09/21 2047)  sodium chloride 0.9 % bolus 1,000 mL (0 mLs Intravenous Stopped 03/09/21 2057)  iohexol (OMNIPAQUE) 350 MG/ML injection 75 mL (75 mLs Intravenous Contrast Given 03/09/21 1823)    ED Course  I have reviewed the triage vital signs and the nursing notes.  Pertinent labs & imaging results that were available during my care of the patient were reviewed by me and considered in my medical decision making (see chart for details).    MDM Rules/Calculators/A&P                          30 year old male with history of alcohol abuse and gout, recently started on allopurinol 1 month ago.  He returns with worsening weakness, lower extremity swelling, jaundice.  Found to have elevated cr on outpatient labs today.  He has had decreased urine output despite normal hydration.  Urine is dark brown.  Concerns for rhabdomyolysis.   He is tachycardic on arrival.  He was given 2 L of IV fluids awaiting lab results.  Bedside ultrasound was concerning for gallbladder wall thickening.  CT abd/pelvis pending.   Labs consistent with acute failure.  AST 1528.  ALT 918.  T bili 5.3.  Ammonia elevated.  Coags elevated w/ INR 2.6.  Etiology of liver failure currently unknown.  CT abdomen pelvis showed fatty infiltration of the liver, but no other acute abnormalities.  Questionable pyelo on the left, but no signs of acute infxn on UA.  Hepatitis panel pending.  Tylenol level normal.  ETOH level normal.  ABG consistent with metabolic acidosis pH 7.3, PCO2 15, bicarb 7.6.  Patient receiving IV fluids as above.  COVID and flu negative.  Count normal.  Low concern for sepsis.  Potassium mildly elevated to 5.6, but no peaked T waves on EKG.  Creatinine elevated at 1.77.  BUN 26.  Likely prerenal.  Lipase normal, low concern for pancreatitis.  Patient became increasingly short of breath requiring supplemental O2.  Chest x-ray showed cardiomegaly, but no acute interstitial edema.  Lungs CTA B on exam.  Tachypnea may be related to metabolic acidosis. Findings discussed with patient.  Will admit for further evaluation and management of acute liver failure.  Medicine team contacted.  Handoff given.  Final Clinical Impression(s) / ED Diagnoses Final diagnoses:  RUQ pain  Acute liver failure    Rx / DC Orders ED Discharge Orders     None  Idamae Lusher, MD 03/09/21 [id number]    Lacretia Leigh, MD 03/13/21 1032

## 2021-03-09 NOTE — ED Provider Notes (Signed)
Emergency Medicine Provider Triage Evaluation Note  Curtis Erickson , a 30 y.o. male  was evaluated in triage.  Pt complains of feeling lethargic.  Reports he had blood work drawn this morning and was sent to the emergency department as they told him he was likely dehydrated.  Patient reports he is felt like he is unable to ambulate due to bilateral leg weakness.  He has been taking allopurinol that was given to him for an episode of gout.  States that he only urinates about once a day, has been increasing his fluids but reports his urine does appear dark brown.  No drug use, no alcohol abuse.  Review of Systems  Positive: legs weakness, urinary symptoms  Negative: nausea, vomiting  Physical Exam  BP (!) 139/59 (BP Location: Left Arm)   Pulse (!) 124   Temp 99.7 F (37.6 C)   Resp (!) 22   SpO2 99%  Gen:   Awake, no distress  Resp:  Normal effort MSK:   Moves extremities without difficulty  Other:  Jaundice, lateral legs appear indurated, compartments are not soft.  There is no calf tenderness.  Medical Decision Making  Medically screening exam initiated at 3:23 PM.  Appropriate orders placed.  Curtis Erickson was informed that the remainder of the evaluation will be completed by another provider, this initial triage assessment does not replace that evaluation, and the importance of remaining in the ED until their evaluation is complete.  Patient here after receiving abnormal labs from this morning.  Labs were reviewed by me, slight elevation on his creatinine level at 1.5 from his baseline.  Reports he was placed on allopurinol a couple of days ago for gout, states that he feels now lethargic, feels overall unwell.  He does appear jaundice, denies any history of liver disease, or alcohol abuse.   Claude Manges, PA-C 03/09/21 1547    Rozelle Logan, DO 03/10/21 1021

## 2021-03-09 NOTE — Progress Notes (Signed)
Contacted by internal medicine team regarding this patient who is being admitted with acute liver injury with concern for hepatic failure, acute kidney injury, metabolic acidosis.  Patient not seen tonight but chart reviewed including lab work from today, CT scan abdomen pelvis from today, primary care complete physical exam from October 2022.  Patient presenting with general malaise, increasing lower extremity edema, dyspnea, dark urine.  Lab work notable for markedly abnormal liver enzymes including AST 1500, ALT 900, total bili 5.3, normal alkaline phosphatase.  Normal albumin.  Creatinine 1.77 above baseline of 0.75.  Significant coagulopathy with INR 2.6 (was 1.2 in June 2021). -- Acute hepatitis panel negative --Acetaminophen and salicylate level negative --Ethanol negative --CK normal  CT abdomen pelvis with contrast --fatty liver, mild free fluid and anasarca, patchy enhancement left kidney questionable pyelonephritis  Recent initiation of allopurinol for gout Per resident team patient denies heavy drinking of late with last drink greater than 7 days ago.  He is currently mentating well with no evidence of encephalopathy.  I discussed plan with internal medicine team tonight.  Critical care is seeing the patient for his significant metabolic acidosis.  I have recommended: -- Contact a liver transplant center ([facility], [facility] or [facility]) given the possibility of DILI causing acute hepatic injury and possible failure --he does not have medical insurance though he is a veteran and may have VA benefit?  Insurance would likely be required for him to be considered for transplantation but would defer this to the transplant center --Stop allopurinol and all other hepatotoxins --Trend INR, every 12 hours --Closely monitor mental status --Consider NAC for DILI (even though no known APAP use)  GI consult team will formally see patient with full consult in the morning but remain available overnight.   We are also available if liver transplant center needs our opinion.

## 2021-03-09 NOTE — ED Notes (Signed)
Patient transported to CT 

## 2021-03-10 ENCOUNTER — Encounter (HOSPITAL_COMMUNITY): Payer: Self-pay | Admitting: Pulmonary Disease

## 2021-03-10 ENCOUNTER — Inpatient Hospital Stay (HOSPITAL_COMMUNITY): Payer: Self-pay

## 2021-03-10 DIAGNOSIS — K767 Hepatorenal syndrome: Secondary | ICD-10-CM | POA: Diagnosis present

## 2021-03-10 DIAGNOSIS — E872 Acidosis, unspecified: Secondary | ICD-10-CM

## 2021-03-10 DIAGNOSIS — I517 Cardiomegaly: Secondary | ICD-10-CM

## 2021-03-10 DIAGNOSIS — S36119A Unspecified injury of liver, initial encounter: Secondary | ICD-10-CM | POA: Diagnosis present

## 2021-03-10 DIAGNOSIS — R601 Generalized edema: Secondary | ICD-10-CM

## 2021-03-10 DIAGNOSIS — B179 Acute viral hepatitis, unspecified: Secondary | ICD-10-CM

## 2021-03-10 LAB — URINALYSIS, ROUTINE W REFLEX MICROSCOPIC
Glucose, UA: NEGATIVE mg/dL
Hgb urine dipstick: NEGATIVE
Ketones, ur: NEGATIVE mg/dL
Leukocytes,Ua: NEGATIVE
Nitrite: NEGATIVE
Protein, ur: 30 mg/dL — AB
Specific Gravity, Urine: >1.03 — ABNORMAL HIGH (ref 1.005–1.030)
pH: 5.5 (ref 5.0–8.0)

## 2021-03-10 LAB — ECHOCARDIOGRAM COMPLETE
AR max vel: 3.48 cm2
AV Peak grad: 12.1 mmHg
Ao pk vel: 1.74 m/s
Area-P 1/2: 5.06 cm2
Calc EF: 63.8 %
Height: 68 in
S' Lateral: 2.8 cm
Single Plane A2C EF: 63.5 %
Single Plane A4C EF: 64.2 %
Weight: 3675.51 oz

## 2021-03-10 LAB — BLOOD GAS, ARTERIAL
Acid-base deficit: 24.7 mmol/L — ABNORMAL HIGH (ref 0.0–2.0)
Bicarbonate: 3.4 mmol/L — ABNORMAL LOW (ref 20.0–28.0)
Drawn by: 24486
FIO2: 100
O2 Saturation: 94.6 %
Patient temperature: 37
pCO2 arterial: <19 mmHg — CL (ref 32.0–48.0)
pH, Arterial: 7.141 — CL (ref 7.350–7.450)
pO2, Arterial: 111 mmHg — ABNORMAL HIGH (ref 83.0–108.0)

## 2021-03-10 LAB — RAPID URINE DRUG SCREEN, HOSP PERFORMED
Amphetamines: NOT DETECTED
Barbiturates: NOT DETECTED
Benzodiazepines: NOT DETECTED
Cocaine: NOT DETECTED
Opiates: NOT DETECTED
Tetrahydrocannabinol: NOT DETECTED

## 2021-03-10 LAB — URINALYSIS, MICROSCOPIC (REFLEX)

## 2021-03-10 LAB — COMPREHENSIVE METABOLIC PANEL
ALT: 913 U/L — ABNORMAL HIGH (ref 0–44)
ALT: 932 U/L — ABNORMAL HIGH (ref 0–44)
ALT: 1003 U/L — ABNORMAL HIGH (ref 0–44)
AST: 1443 U/L — ABNORMAL HIGH (ref 15–41)
AST: 1560 U/L — ABNORMAL HIGH (ref 15–41)
AST: 1713 U/L — ABNORMAL HIGH (ref 15–41)
Albumin: 3.5 g/dL (ref 3.5–5.0)
Albumin: 3.4 g/dL — ABNORMAL LOW (ref 3.5–5.0)
Albumin: 3 g/dL — ABNORMAL LOW (ref 3.5–5.0)
Alkaline Phosphatase: 62 U/L (ref 38–126)
Alkaline Phosphatase: 65 U/L (ref 38–126)
Alkaline Phosphatase: 54 U/L (ref 38–126)
BUN: 28 mg/dL — ABNORMAL HIGH (ref 6–20)
BUN: 29 mg/dL — ABNORMAL HIGH (ref 6–20)
BUN: 31 mg/dL — ABNORMAL HIGH (ref 6–20)
CO2: <7 mmol/L — ABNORMAL LOW (ref 22–32)
CO2: <7 mmol/L — ABNORMAL LOW (ref 22–32)
CO2: <7 mmol/L — ABNORMAL LOW (ref 22–32)
Calcium: 8.8 mg/dL — ABNORMAL LOW (ref 8.9–10.3)
Calcium: 8.8 mg/dL — ABNORMAL LOW (ref 8.9–10.3)
Calcium: 8.1 mg/dL — ABNORMAL LOW (ref 8.9–10.3)
Chloride: 99 mmol/L (ref 98–111)
Chloride: 98 mmol/L (ref 98–111)
Chloride: 95 mmol/L — ABNORMAL LOW (ref 98–111)
Creatinine, Ser: 1.9 mg/dL — ABNORMAL HIGH (ref 0.61–1.24)
Creatinine, Ser: 2.23 mg/dL — ABNORMAL HIGH (ref 0.61–1.24)
Creatinine, Ser: 2.41 mg/dL — ABNORMAL HIGH (ref 0.61–1.24)
GFR, Estimated: 48 mL/min — ABNORMAL LOW (ref >60)
GFR, Estimated: 40 mL/min — ABNORMAL LOW (ref >60)
GFR, Estimated: 36 mL/min — ABNORMAL LOW (ref >60)
Glucose, Bld: 64 mg/dL — ABNORMAL LOW (ref 70–99)
Glucose, Bld: 56 mg/dL — ABNORMAL LOW (ref 70–99)
Glucose, Bld: 140 mg/dL — ABNORMAL HIGH (ref 70–99)
Potassium: 6.7 mmol/L (ref 3.5–5.1)
Potassium: >7.5 mmol/L (ref 3.5–5.1)
Potassium: 6.2 mmol/L — ABNORMAL HIGH (ref 3.5–5.1)
Sodium: 131 mmol/L — ABNORMAL LOW (ref 135–145)
Sodium: 132 mmol/L — ABNORMAL LOW (ref 135–145)
Sodium: 129 mmol/L — ABNORMAL LOW (ref 135–145)
Total Bilirubin: 5.5 mg/dL — ABNORMAL HIGH (ref 0.3–1.2)
Total Bilirubin: 6 mg/dL — ABNORMAL HIGH (ref 0.3–1.2)
Total Bilirubin: 5.6 mg/dL — ABNORMAL HIGH (ref 0.3–1.2)
Total Protein: 6 g/dL — ABNORMAL LOW (ref 6.5–8.1)
Total Protein: 6.1 g/dL — ABNORMAL LOW (ref 6.5–8.1)
Total Protein: 5.5 g/dL — ABNORMAL LOW (ref 6.5–8.1)

## 2021-03-10 LAB — CBC
HCT: 29.9 % — ABNORMAL LOW (ref 39.0–52.0)
Hemoglobin: 10 g/dL — ABNORMAL LOW (ref 13.0–17.0)
MCH: 37 pg — ABNORMAL HIGH (ref 26.0–34.0)
MCHC: 33.4 g/dL (ref 30.0–36.0)
MCV: 110.7 fL — ABNORMAL HIGH (ref 80.0–100.0)
Platelets: 112 10*3/uL — ABNORMAL LOW (ref 150–400)
RBC: 2.7 MIL/uL — ABNORMAL LOW (ref 4.22–5.81)
RDW: 16.6 % — ABNORMAL HIGH (ref 11.5–15.5)
WBC: 4.2 10*3/uL (ref 4.0–10.5)
nRBC: 2.1 % — ABNORMAL HIGH (ref 0.0–0.2)

## 2021-03-10 LAB — VOLATILES,BLD-ACETONE,ETHANOL,ISOPROP,METHANOL
Acetone, blood: <0.01 g/dL (ref 0.000–0.010)
Ethanol, blood: <0.01 g/dL (ref 0.000–0.010)
Isopropanol, blood: <0.01 g/dL (ref 0.000–0.010)
Methanol, blood: <0.01 g/dL (ref 0.000–0.010)

## 2021-03-10 LAB — LACTIC ACID, PLASMA
Lactic Acid, Venous: >9 mmol/L (ref 0.5–1.9)
Lactic Acid, Venous: >9 mmol/L (ref 0.5–1.9)
Lactic Acid, Venous: 6.8 mmol/L (ref 0.5–1.9)

## 2021-03-10 LAB — CBC WITH DIFFERENTIAL/PLATELET
Abs Immature Granulocytes: 0.1 10*3/uL — ABNORMAL HIGH (ref 0.00–0.07)
Basophils Absolute: 0 10*3/uL (ref 0.0–0.1)
Basophils Relative: 0 %
Eosinophils Absolute: 0 10*3/uL (ref 0.0–0.5)
Eosinophils Relative: 0 %
HCT: 32.5 % — ABNORMAL LOW (ref 39.0–52.0)
Hemoglobin: 9.9 g/dL — ABNORMAL LOW (ref 13.0–17.0)
Immature Granulocytes: 2 %
Lymphocytes Relative: 20 %
Lymphs Abs: 1.3 10*3/uL (ref 0.7–4.0)
MCH: 36.9 pg — ABNORMAL HIGH (ref 26.0–34.0)
MCHC: 30.5 g/dL (ref 30.0–36.0)
MCV: 121.3 fL — ABNORMAL HIGH (ref 80.0–100.0)
Monocytes Absolute: 0.5 10*3/uL (ref 0.1–1.0)
Monocytes Relative: 9 %
Neutro Abs: 4.3 10*3/uL (ref 1.7–7.7)
Neutrophils Relative %: 69 %
Platelets: 174 10*3/uL (ref 150–400)
RBC: 2.68 MIL/uL — ABNORMAL LOW (ref 4.22–5.81)
RDW: 17.1 % — ABNORMAL HIGH (ref 11.5–15.5)
WBC: 6.3 10*3/uL (ref 4.0–10.5)
nRBC: 2.1 % — ABNORMAL HIGH (ref 0.0–0.2)

## 2021-03-10 LAB — BASIC METABOLIC PANEL
Anion gap: 26 — ABNORMAL HIGH (ref 5–15)
Anion gap: 16 — ABNORMAL HIGH (ref 5–15)
BUN: 30 mg/dL — ABNORMAL HIGH (ref 6–20)
BUN: 23 mg/dL — ABNORMAL HIGH (ref 6–20)
CO2: 11 mmol/L — ABNORMAL LOW (ref 22–32)
CO2: 23 mmol/L (ref 22–32)
Calcium: 8.3 mg/dL — ABNORMAL LOW (ref 8.9–10.3)
Calcium: 8.3 mg/dL — ABNORMAL LOW (ref 8.9–10.3)
Chloride: 94 mmol/L — ABNORMAL LOW (ref 98–111)
Chloride: 94 mmol/L — ABNORMAL LOW (ref 98–111)
Creatinine, Ser: 2.23 mg/dL — ABNORMAL HIGH (ref 0.61–1.24)
Creatinine, Ser: 1.55 mg/dL — ABNORMAL HIGH (ref 0.61–1.24)
GFR, Estimated: 40 mL/min — ABNORMAL LOW (ref >60)
GFR, Estimated: >60 mL/min (ref >60)
Glucose, Bld: 146 mg/dL — ABNORMAL HIGH (ref 70–99)
Glucose, Bld: 98 mg/dL (ref 70–99)
Potassium: 4.5 mmol/L (ref 3.5–5.1)
Potassium: 3.5 mmol/L (ref 3.5–5.1)
Sodium: 131 mmol/L — ABNORMAL LOW (ref 135–145)
Sodium: 133 mmol/L — ABNORMAL LOW (ref 135–145)

## 2021-03-10 LAB — GLUCOSE, CAPILLARY
Glucose-Capillary: 130 mg/dL — ABNORMAL HIGH (ref 70–99)
Glucose-Capillary: 134 mg/dL — ABNORMAL HIGH (ref 70–99)
Glucose-Capillary: 149 mg/dL — ABNORMAL HIGH (ref 70–99)
Glucose-Capillary: 135 mg/dL — ABNORMAL HIGH (ref 70–99)
Glucose-Capillary: 107 mg/dL — ABNORMAL HIGH (ref 70–99)
Glucose-Capillary: 96 mg/dL (ref 70–99)

## 2021-03-10 LAB — CBG MONITORING, ED
Glucose-Capillary: 51 mg/dL — ABNORMAL LOW (ref 70–99)
Glucose-Capillary: 157 mg/dL — ABNORMAL HIGH (ref 70–99)

## 2021-03-10 LAB — SODIUM, URINE, RANDOM: Sodium, Ur: 11 mmol/L

## 2021-03-10 LAB — BLOOD GAS, VENOUS
Acid-base deficit: 2.1 mmol/L — ABNORMAL HIGH (ref 0.0–2.0)
Bicarbonate: 20.5 mmol/L (ref 20.0–28.0)
Drawn by: 164
FIO2: 32
O2 Saturation: 93.1 %
Patient temperature: 37
pCO2, Ven: 25.7 mmHg — ABNORMAL LOW (ref 44.0–60.0)
pH, Ven: 7.513 — ABNORMAL HIGH (ref 7.250–7.430)
pO2, Ven: 68.7 mmHg — ABNORMAL HIGH (ref 32.0–45.0)

## 2021-03-10 LAB — PROTIME-INR
INR: 3.1 — ABNORMAL HIGH (ref 0.8–1.2)
INR: 2.4 — ABNORMAL HIGH (ref 0.8–1.2)
Prothrombin Time: 32.2 seconds — ABNORMAL HIGH (ref 11.4–15.2)
Prothrombin Time: 26.2 seconds — ABNORMAL HIGH (ref 11.4–15.2)

## 2021-03-10 LAB — RENAL FUNCTION PANEL
Albumin: 2.9 g/dL — ABNORMAL LOW (ref 3.5–5.0)
Anion gap: 19 — ABNORMAL HIGH (ref 5–15)
BUN: 27 mg/dL — ABNORMAL HIGH (ref 6–20)
CO2: 20 mmol/L — ABNORMAL LOW (ref 22–32)
Calcium: 8.2 mg/dL — ABNORMAL LOW (ref 8.9–10.3)
Chloride: 94 mmol/L — ABNORMAL LOW (ref 98–111)
Creatinine, Ser: 1.74 mg/dL — ABNORMAL HIGH (ref 0.61–1.24)
GFR, Estimated: 54 mL/min — ABNORMAL LOW (ref >60)
Glucose, Bld: 121 mg/dL — ABNORMAL HIGH (ref 70–99)
Phosphorus: 2.8 mg/dL (ref 2.5–4.6)
Potassium: 4 mmol/L (ref 3.5–5.1)
Sodium: 133 mmol/L — ABNORMAL LOW (ref 135–145)

## 2021-03-10 LAB — GAMMA GT: GGT: 45 U/L (ref 7–50)

## 2021-03-10 LAB — HEPATIC FUNCTION PANEL
ALT: 880 U/L — ABNORMAL HIGH (ref 0–44)
AST: 1378 U/L — ABNORMAL HIGH (ref 15–41)
Albumin: 3.5 g/dL (ref 3.5–5.0)
Alkaline Phosphatase: 60 U/L (ref 38–126)
Bilirubin, Direct: 1.6 mg/dL — ABNORMAL HIGH (ref 0.0–0.2)
Indirect Bilirubin: 4.2 mg/dL — ABNORMAL HIGH (ref 0.3–0.9)
Total Bilirubin: 5.8 mg/dL — ABNORMAL HIGH (ref 0.3–1.2)
Total Protein: 6.3 g/dL — ABNORMAL LOW (ref 6.5–8.1)

## 2021-03-10 LAB — PHOSPHORUS: Phosphorus: 5.6 mg/dL — ABNORMAL HIGH (ref 2.5–4.6)

## 2021-03-10 LAB — FOLATE: Folate: 4.1 ng/mL — ABNORMAL LOW (ref >5.9)

## 2021-03-10 LAB — ETHYLENE GLYCOL: Ethylene Glycol Lvl: <5 mg/dL

## 2021-03-10 LAB — LACTATE DEHYDROGENASE: LDH: 995 U/L — ABNORMAL HIGH (ref 98–192)

## 2021-03-10 LAB — MRSA NEXT GEN BY PCR, NASAL: MRSA by PCR Next Gen: NOT DETECTED

## 2021-03-10 LAB — T4, FREE: Free T4: 1.25 ng/dL — ABNORMAL HIGH (ref 0.61–1.12)

## 2021-03-10 LAB — PROTEIN / CREATININE RATIO, URINE
Creatinine, Urine: 103.96 mg/dL
Protein Creatinine Ratio: 0.25 mg/mg{Cre} — ABNORMAL HIGH (ref 0.00–0.15)
Total Protein, Urine: 26 mg/dL

## 2021-03-10 LAB — MAGNESIUM: Magnesium: 1.9 mg/dL (ref 1.7–2.4)

## 2021-03-10 MED ORDER — DEXTROSE 50 % IV SOLN: 1.0000 | INTRAVENOUS | Status: DC | PRN

## 2021-03-10 MED ORDER — SODIUM ZIRCONIUM CYCLOSILICATE 10 G PO PACK: 10.0000 g | PACK | Freq: Once | ORAL | Status: DC

## 2021-03-10 MED ORDER — HEPARIN SODIUM (PORCINE) 1000 UNIT/ML DIALYSIS
1000.0000 [IU] | INTRAMUSCULAR | Status: DC | PRN
  Administered 2021-03-12: 09:00:00 2800 [IU] via INTRAVENOUS_CENTRAL
  Filled 2021-03-10 (×2): qty 6
  Filled 2021-03-10: qty 3

## 2021-03-10 MED ORDER — DEXTROSE 50 % IV SOLN
50.0000 mL | Freq: Once | INTRAVENOUS | Status: AC
Start: 2021-03-10 — End: 2021-03-10
  Administered 2021-03-10: 50 mL via INTRAVENOUS
  Filled 2021-03-10: qty 50

## 2021-03-10 MED ORDER — SODIUM CHLORIDE 0.9 % IV SOLN
500.0000 [IU]/h | INTRAVENOUS | Status: DC
  Administered 2021-03-10: 500 [IU]/h via INTRAVENOUS_CENTRAL
  Administered 2021-03-11 (×2): 750 [IU]/h via INTRAVENOUS_CENTRAL
  Administered 2021-03-12: 01:00:00 1000 [IU]/h via INTRAVENOUS_CENTRAL
  Filled 2021-03-10 (×4): qty 2

## 2021-03-10 MED ORDER — PRISMASOL BGK 4/2.5 32-4-2.5 MEQ/L REPLACEMENT SOLN
Status: DC
  Filled 2021-03-10 (×6): qty 5000

## 2021-03-10 MED ORDER — PRISMASOL BGK 0/2.5 32-2.5 MEQ/L EC SOLN
Status: DC
  Filled 2021-03-10 (×12): qty 5000

## 2021-03-10 MED ORDER — FUROSEMIDE 10 MG/ML IJ SOLN
40.0000 mg | Freq: Once | INTRAMUSCULAR | Status: AC
  Administered 2021-03-10: 40 mg via INTRAVENOUS
  Filled 2021-03-10: qty 4

## 2021-03-10 MED ORDER — FOLIC ACID 1 MG PO TABS
1.0000 mg | ORAL_TABLET | Freq: Every day | ORAL | Status: DC
  Administered 2021-03-10 – 2021-03-15 (×6): 1 mg via ORAL
  Filled 2021-03-10 (×6): qty 1

## 2021-03-10 MED ORDER — SODIUM BICARBONATE 8.4 % IV SOLN
50.0000 meq | Freq: Once | INTRAVENOUS | Status: AC
Start: 2021-03-10 — End: 2021-03-10
  Administered 2021-03-10: 50 meq via INTRAVENOUS
  Filled 2021-03-10: qty 50

## 2021-03-10 MED ORDER — SODIUM CHLORIDE 0.9 % FOR CRRT: INTRAVENOUS_CENTRAL | Status: DC | PRN

## 2021-03-10 MED ORDER — CALCIUM GLUCONATE-NACL 1-0.675 GM/50ML-% IV SOLN
1.0000 g | Freq: Once | INTRAVENOUS | Status: AC
  Administered 2021-03-10: 1000 mg via INTRAVENOUS
  Filled 2021-03-10: qty 50

## 2021-03-10 MED ORDER — PRISMASOL BGK 0/2.5 32-2.5 MEQ/L REPLACEMENT SOLN
Status: DC
  Filled 2021-03-10 (×5): qty 5000

## 2021-03-10 MED ORDER — THIAMINE HCL 100 MG PO TABS
100.0000 mg | ORAL_TABLET | Freq: Every day | ORAL | Status: DC
  Administered 2021-03-10 – 2021-03-15 (×6): 100 mg via ORAL
  Filled 2021-03-10 (×6): qty 1

## 2021-03-10 MED ORDER — PRISMASOL BGK 4/2.5 32-4-2.5 MEQ/L EC SOLN
Status: DC
  Filled 2021-03-10 (×17): qty 5000

## 2021-03-10 MED ORDER — INSULIN ASPART 100 UNIT/ML IV SOLN
10.0000 [IU] | Freq: Once | INTRAVENOUS | Status: AC
  Administered 2021-03-10: 10 [IU] via INTRAVENOUS

## 2021-03-10 MED ORDER — SODIUM CHLORIDE 0.9 % IV SOLN
2.0000 g | INTRAVENOUS | Status: AC
  Administered 2021-03-10 – 2021-03-14 (×5): 2 g via INTRAVENOUS
  Filled 2021-03-10 (×5): qty 20

## 2021-03-10 MED ORDER — SODIUM BICARBONATE 8.4 % IV SOLN
INTRAVENOUS | Status: DC
  Filled 2021-03-10 (×3): qty 1000

## 2021-03-10 MED ORDER — PRISMASOL BGK 0/2.5 32-2.5 MEQ/L EC SOLN
Status: DC
  Filled 2021-03-10 (×2): qty 5000

## 2021-03-10 MED ORDER — INSULIN ASPART 100 UNIT/ML IV SOLN
5.0000 [IU] | Freq: Once | INTRAVENOUS | Status: AC
  Administered 2021-03-10: 5 [IU] via INTRAVENOUS

## 2021-03-10 MED ORDER — VITAMIN K1 10 MG/ML IJ SOLN
10.0000 mg | Freq: Once | INTRAVENOUS | Status: AC
  Administered 2021-03-10: 10 mg via INTRAVENOUS
  Filled 2021-03-10 (×2): qty 1

## 2021-03-10 MED ORDER — STERILE WATER FOR INJECTION IV SOLN
INTRAVENOUS | Status: DC
  Filled 2021-03-10: qty 1000

## 2021-03-10 MED ORDER — PRISMASOL BGK 0/2.5 32-2.5 MEQ/L REPLACEMENT SOLN
Status: DC
  Filled 2021-03-10 (×3): qty 5000

## 2021-03-10 MED ORDER — NOREPINEPHRINE 4 MG/250ML-% IV SOLN
0.0000 ug/min | INTRAVENOUS | Status: DC
  Administered 2021-03-10: 2 ug/min via INTRAVENOUS
  Filled 2021-03-10: qty 250

## 2021-03-10 MED ORDER — DEXTROSE 50 % IV SOLN
1.0000 | Freq: Once | INTRAVENOUS | Status: AC
  Administered 2021-03-10: 50 mL via INTRAVENOUS
  Filled 2021-03-10: qty 50

## 2021-03-10 MED ORDER — SODIUM BICARBONATE 8.4 % IV SOLN
100.0000 meq | Freq: Once | INTRAVENOUS | Status: AC
  Administered 2021-03-10: 100 meq via INTRAVENOUS
  Filled 2021-03-10: qty 100

## 2021-03-10 MED ORDER — ORAL CARE MOUTH RINSE
15.0000 mL | Freq: Two times a day (BID) | OROMUCOSAL | Status: DC
  Administered 2021-03-10 – 2021-03-15 (×10): 15 mL via OROMUCOSAL

## 2021-03-10 MED ORDER — CHLORHEXIDINE GLUCONATE CLOTH 2 % EX PADS
6.0000 | MEDICATED_PAD | Freq: Every day | CUTANEOUS | Status: DC
  Administered 2021-03-10 – 2021-03-13 (×4): 6 via TOPICAL

## 2021-03-10 MED ORDER — INSULIN ASPART 100 UNIT/ML IJ SOLN: 5.0000 [IU] | Freq: Once | INTRAMUSCULAR | Status: DC

## 2021-03-10 NOTE — Procedures (Signed)
Central Venous Catheter Insertion Procedure Note  JAELYN CLONINGER  [id number]  1991/04/01  Date:03/10/21  Time:4:09 AM   Provider Performing:Emeli Goguen Celine Mans   Procedure: Insertion of Non-tunneled Central Venous Catheter(36556)with US guidance (98338)    Indication(s) Hemodialysis  Consent Risks of the procedure as well as the alternatives and risks of each were explained to the patient and/or caregiver.  Consent for the procedure was obtained and is signed in the bedside chart  Anesthesia Topical only with 1% lidocaine   Timeout Verified patient identification, verified procedure, site/side was marked, verified correct patient position, special equipment/implants available, medications/allergies/relevant history reviewed, required imaging and test results available.  Sterile Technique Maximal sterile technique including full sterile barrier drape, hand hygiene, sterile gown, sterile gloves, mask, hair covering, sterile ultrasound probe cover (if used).  Procedure Description Area of catheter insertion was cleaned with chlorhexidine and draped in sterile fashion.   With real-time ultrasound guidance a HD catheter was placed into the left internal jugular vein.  Nonpulsatile blood flow and easy flushing noted in all ports.  The catheter was sutured in place and sterile dressing applied.  Complications/Tolerance None; patient tolerated the procedure well. Chest X-ray is ordered to verify placement for internal jugular or subclavian cannulation.  Chest x-ray is not ordered for femoral cannulation.  EBL Minimal  Specimen(s) None   Rutherford Guys, PA - [facility] For pager details, please see AMION or use Epic chat  After 1900, please call ELINK for cross coverage needs 03/10/2021, 4:09 AM

## 2021-03-10 NOTE — TOC Initial Note (Signed)
Transition of Care [facility]) - Initial/Assessment Note    Patient Details  Name: Curtis Erickson MRN: [id number] Date of Birth: 1991-03-13  Transition of Care [facility]) CM/SW Contact:    Joanne Chars, LCSW Phone Number: 03/10/2021, 3:05 PM  Clinical Narrative:     CSW messaged by MD regarding need for pt to reestablish care at [facility].  CSW met with pt and girlfriend Curtis Erickson, permission given to speak with girlfriend and also with father Curtis Erickson.  Pt previously seen at [facility] but last contact was 2016.  Pt agreeable to doing what is needed to reestablish care at [facility] if it will help.  Pt lives with girlfriend and daughter.       CSW spoke with [facility].  Pt needs to contact the eligibility line to start the process of being assigned to a PCP.  Has to call [phone] ext [phone].    Transfer center at [place] is also [phone] [id number].  [facility] [place] is on diversion for new patients currently.             Expected Discharge Plan:  (TBD) Barriers to Discharge: Continued Medical Work up   Patient Goals and CMS Choice        Expected Discharge Plan and Services Expected Discharge Plan:  (TBD)     Post Acute Care Choice:  (TBD) Living arrangements for the past 2 months: Single Family Home                                      Prior Living Arrangements/Services Living arrangements for the past 2 months: Single Family Home Lives with:: Significant Other Patient language and need for interpreter reviewed:: Yes Do you feel safe going back to the place where you live?: Yes      Need for Family Participation in Patient Care: No (Comment) Care giver support system in place?: Yes (comment) Current home services: Other (comment) (none) Criminal Activity/Legal Involvement Pertinent to Current Situation/Hospitalization: No - Comment as needed  Activities of Daily Living      Permission Sought/Granted Permission sought to share information with : Family Supports Permission granted to  share information with : Yes, Verbal Permission Granted  Share Information with NAME: father Curtis Erickson, Girlfriend Curtis Erickson           Emotional Assessment Appearance:: Appears stated age Attitude/Demeanor/Rapport: Engaged Affect (typically observed): Appropriate Orientation: : Oriented to Self, Oriented to Place, Oriented to  Time, Oriented to Situation Alcohol / Substance Use: Not Applicable Psych Involvement: No (comment)  Admission diagnosis:  Liver injury [S36.119A] 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] RUQ pain [R10.11] Acute liver failure [K72.00] Encounter for central line placement [Z45.2] Patient Active Problem List   Diagnosis Date Noted   Lactic acidosis 03/10/2021   Anasarca 03/10/2021   Liver injury 03/10/2021   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   Acute hepatitis 03/09/2021   Acute hypoxemic respiratory failure ([place]) 03/09/2021   Gout 03/09/2021   High anion gap metabolic acidosis 23/53/6144   Respiratory alkalosis 03/09/2021   Sinus tachycardia 03/09/2021   AKI (acute kidney injury) ([place]) 03/09/2021   Injury to liver    Severe sepsis without septic shock (CODE) ([place]) 09/19/2019   Acute pyelonephritis 09/19/2019   B12 deficiency 09/19/2019   Folate deficiency 09/19/2019   Dietary iron deficiency 09/19/2019   ETOH abuse 09/19/2019   Pancytopenia ([place]) 31/54/0086   Alcoholic fatty liver 76/19/5093   PCP:  Patient,  [name] (Inactive) Pharmacy:   [id number] [id number]: [phone]: [phone]    Social Determinants of Health (SDOH) Interventions    Readmission Risk Interventions No flowsheet data found.

## 2021-03-10 NOTE — Progress Notes (Addendum)
Overnight, patient's blood work has shown worsening metabolic acidosis, persistent lactic acidosis greater than 9.  Now with hyperkalemia, K greater than 7.5 and worsening renal function.  EKG shows peaked T waves. Worsening ABG, 7.1/19/111/3 0.4.  Patient is clinically unchanged, mentating well and continuing to saturate well on room air however remains severely ill.  Vitals have remained stable, tachypneic in the 30s and tachycardic in the 110s to 120s.  Pressures are stable.      Vitals:   03/09/21 2300 03/10/21 0230  BP: (!) 139/56 117/61  Pulse: (!) 119 (!) 109  Resp: (!) 32 (!) 31  Temp:    SpO2: 93% 100%   CMP Latest Ref Rng & Units 03/10/2021 03/09/2021 03/09/2021  Glucose 70 - 99 mg/dL 19(J) 09(T) -  BUN 6 - 20 mg/dL 26(Z) 12(W) -  Creatinine 0.61 - 1.24 mg/dL 5.80(D) 9.83(J) -  Sodium 135 - 145 mmol/L 132(L) 131(L) -  Potassium 3.5 - 5.1 mmol/L >7.5(HH) 6.7(HH) -  Chloride 98 - 111 mmol/L 98 99 -  CO2 22 - 32 mmol/L <7(L) <7(L) -  Calcium 8.9 - 10.3 mg/dL 8.2(N) 0.5(L) -  Total Protein 6.5 - 8.1 g/dL 6.1(L) 6.0(L) 6.3(L)  Total Bilirubin 0.3 - 1.2 mg/dL 6.0(H) 5.5(H) 5.8(H)  Alkaline Phos 38 - 126 U/L 65 62 60  AST 15 - 41 U/L 1,560(H) 1,443(H) 1,378(H)  ALT 0 - 44 U/L 932(H) 913(H) 880(H)   ABG    Component Value Date/Time   PHART 7.141 (LL) 03/10/2021 0225   PCO2ART <19.0 (LL) 03/10/2021 0225   PO2ART 111 (H) 03/10/2021 0225   HCO3 3.4 (L) 03/10/2021 0225   TCO2 8 (L) 03/09/2021 1804   ACIDBASEDEF 24.7 (H) 03/10/2021 0225   O2SAT 94.6 03/10/2021 0225   Physical exam General: Ill-appearing, jaundiced and uncomfortable due to work of breathing MSK: Diffuse anasarca  Assessment/plan: Acute severe liver injury now with worsening renal failure, hyperkalemia with EKG changes, worsening metabolic acidosis and persistent lactic acidosis.  Creatinine 2.2, bicarb remains less than 7.  Potassium greater than 7.5.  Patient continues to have minimal urine output. Consulted  nephrology and discussed latest lab results with PCCM.  Nephrology recommending temporary HD cath for urgent dialysis along with medical management of hyperkalemia.  PCCM to place temporary catheter and continues to follow.  Persistent lactic acidosis and worsening metabolic acidosis likely in the setting of severe liver injury and acute renal failure.   -1 g IV calcium gluconate -2 Amps sodium bicarbonate -1 dose of IV furosemide 40 mg -Insulin 10 units, preceded and followed with D50 amps -Close monitoring of blood glucose, may need dextrose infusion due to hypoglycemia in the setting of his severe liver injury -Place Foley catheter -PCCM to place temporary HD catheter for urgent dialysis -Continue progressive care but low threshold for ICU transfer -Appreciate both nephrology and PCCM consultations    Tanicka Bisaillon, DO PGY-3 IMTS

## 2021-03-10 NOTE — Progress Notes (Signed)
eLink Physician-Brief Progress Note Patient Name: Curtis Erickson DOB: May 08, 1990 MRN: [id number]   Date of Service  03/10/2021  HPI/Events of Note  30 y.o. male who has no significant PMH besides gout.  He presented to [facility] ED 12/1 with weakness, fatigue, LE edema, nausea, vomiting for roughly 1 week.  He was seen by PCP and referred to the ED. He was started on Allopurinol roughly 1 month ago for gout flare. This was stopped 12/1. Denies any drug use.  Does drink alcohol but none in past week.  No additional medications.   Found to have severe acidemia, acute renal failure, electrolyte abnormalities, severe transaminitis, and elevated tbili.  Concern for drug induced lung injury.  Seen by GI and nephrology.  Starting on CRRT.  eICU Interventions  Chart reviewed     Intervention Category Evaluation Type: New Patient Evaluation  Henry Russel, P 03/10/2021, 5:50 AM

## 2021-03-10 NOTE — Procedures (Signed)
Admit: 03/09/2021 LOS: 1  Please see consult note from earlier for detail. CRRT was held this morning because of clotting.  We will start fixed rate dose of heparin.  Current CRRT Prescription: Start Date: 03/10/2021 Catheter: IJ Pre Blood Pump: 800 DFR: 2000 Replacement Rate: 500 Goal UF: 50-100 cc/hr Anticoagulation: heparin Clotting: yes    S: Started CRRT for severe acidosis, hyperkalemia.   O: 12/01 0701 - 12/02 0700 In: 5425.7 [I.V.:1375.7; IV Piggyback:4050] Out: 0   Filed Weights   03/10/21 0531  Weight: 104.2 kg    Recent Labs  Lab 03/09/21 2050 03/10/21 0133 03/10/21 0610  NA 131* 132* 129*  K 6.7* >7.5* 6.2*  CL 99 98 95*  CO2 <7* <7* <7*  GLUCOSE 64* 56* 140*  BUN 28* 29* 31*  CREATININE 1.90* 2.23* 2.41*  CALCIUM 8.8* 8.8* 8.1*  PHOS  --   --  5.6*   Recent Labs  Lab 03/09/21 1643 03/09/21 1804 03/10/21 0610  WBC 7.3  --  6.3  NEUTROABS 3.9  --  4.3  HGB 11.9* 12.6* 9.9*  HCT 37.4* 37.0* 32.5*  MCV 117.2*  --  121.3*  PLT 294  --  174    Scheduled Meds:  Chlorhexidine Gluconate Cloth  6 each Topical Daily   Continuous Infusions:  acetylcysteine 6.25 mg/kg/hr (03/10/21 0800)   phytonadione (VITAMIN K) IV     prismasol BGK 0/2.5 800 mL/hr at 03/10/21 0551   prismasol BGK 0/2.5 500 mL/hr at 03/10/21 0551   prismasol BGK 0/2.5 2,000 mL/hr at 03/10/21 0551   sodium bicarbonate 150 mEq in D5W infusion 200 mL/hr at 03/10/21 0800   PRN Meds:.dextrose, heparin, ondansetron **OR** ondansetron (ZOFRAN) IV, sodium chloride  ABG    Component Value Date/Time   PHART 7.141 (LL) 03/10/2021 0225   PCO2ART <19.0 (LL) 03/10/2021 0225   PO2ART 111 (H) 03/10/2021 0225   HCO3 3.4 (L) 03/10/2021 0225   TCO2 8 (L) 03/09/2021 1804   ACIDBASEDEF 24.7 (H) 03/10/2021 0225   O2SAT 94.6 03/10/2021 0225    A/P  Continue CRRT for severe hyperkalemia, acidosis and multiple metabolic derangement.  He is edematous therefore we will do ultrafiltration net  50-100 cc an hour if tolerated by blood pressure.  The system was clotted therefore start fixed dose heparin.  INR high because of acute liver disease.  Continue to monitor lab closely.  The last potassium was 6.2,, continue to monitor lab closely.  Discussed with ICU nurse.  Crista Elliot, MD [facility]

## 2021-03-10 NOTE — Consult Note (Signed)
Curtis Erickson Admit Date: 03/09/2021 03/10/2021 Arita Miss Requesting Physician:  Ruben Im MD  Reason for Consult:  AKI, Hyperkalemia, Metabolic Acidosis  HPI:  [name] PMH including gout and hx/o pyelonephritis, nephrolithiasis, presented to the ED from PCP office yesterday with abnormal LFTs, nausea, vomiting, dyspnea, edema, weakness, fatigue.  Approximately 1 month ago patient started allopurinol for gout.  In ED patient with tachypnea and increased respiratory effort.  He has a severe metabolic acidosis with ABG pH 1.61, serum bicarbonate of less than 7, anion gap of greater than 25, lactate of greater than 9.  Urinalysis without ketones.  Most recent potassium is greater than 7.5.  EKG with sinus tachycardia, no specific peaked T waves, normal PR and QRS interval.  He had normal GFR with a creatinine of 0.9 on 10/25 and creatinine has increased to a most recent value of 2.2.  Very little urine output.  Urine without pyuria, hematuria.  Trace proteinuria present.  He is receiving bicarbonate infusion.  Marked LFT abnormalities including AST of 1500, ALT greater than 900, total bilirubin 6.0, alkaline phosphatase of 65.  INR is 2.8.  His LFTs were normal on 01/31/2021.  Hepatology is following.  He has started on NAC.  CT abdomen and pelvis with contrast yesterday evening showed some patchy enhancement of the left kidney but no evidence of obstruction or other structural issues.  At the time of my exam the patient is awake, alert, conversant.  He is in marked respiratory distress/tachypneic.  Blood pressure is 100/38.  He does have some sloughing off the lips but no other mucosal changes.  He has no rashes.  He has no eosinophilia.  Salycylate and APAP levels normal.  Currently rec LR, NaHCO3 gtt.    Creatinine, Ser (mg/dL)  Date Value  09/60/4540 2.23 (H)  03/09/2021 1.90 (H)  03/09/2021 1.77 (H)  09/19/2019 0.75  09/18/2019 0.77  09/18/2019 0.87  09/17/2019 1.02  09/17/2019 0.94   10/12/2018 0.98  11/23/2014 1.28 (H)  ]  ROS NSAIDS: Denies any recent use IV Contrast received IV contrast in the ED at presentation TMP/SMX no exposure Hypotension not present Balance of 12 systems is negative w/ exceptions as above  PMH  Past Medical History:  Diagnosis Date   Hypertension    Kidney stone    PSH  Past Surgical History:  Procedure Laterality Date   APPENDECTOMY     KIDNEY STONE SURGERY     FH No family history on file. SH  reports that he has never smoked. He has never used smokeless tobacco. He reports that he does not currently use alcohol. He reports that he does not currently use drugs. Allergies No Known Allergies Home medications Prior to Admission medications   Medication Sig Start Date End Date Taking? Authorizing Provider  allopurinol (ZYLOPRIM) 100 MG tablet Take 100 mg by mouth daily. 02/01/21  Yes [provider]  ferrous sulfate 325 (65 FE) MG tablet Take 1 tablet (325 mg total) by mouth 2 (two) times daily with a meal. Patient not taking: Reported on 03/09/2021 09/19/19   Dhungel, Theda Belfast, MD  folic acid (FOLVITE) 1 MG tablet Take 1 tablet (1 mg total) by mouth daily. Patient not taking: Reported on 03/09/2021 09/20/19   Dhungel, Theda Belfast, MD  Multiple Vitamin (MULTIVITAMIN WITH MINERALS) TABS tablet Take 1 tablet by mouth daily. Patient not taking: Reported on 03/09/2021 09/20/19   Dhungel, Theda Belfast, MD  thiamine 100 MG tablet Take 1 tablet (100 mg total) by mouth daily. Patient  not taking: Reported on 03/09/2021 09/20/19   Dhungel, Flonnie Overman, MD  vitamin B-12 1000 MCG tablet Take 1 tablet (1,000 mcg total) by mouth daily. Patient not taking: Reported on 03/09/2021 09/20/19   Louellen Molder, MD    Current Medications Scheduled Meds:  dextrose  50 mL Intravenous Once   furosemide  40 mg Intravenous Once   insulin aspart  10 Units Intravenous Once   sodium bicarbonate  100 mEq Intravenous Once   Continuous Infusions:  acetylcysteine  6.25 mg/kg/hr (03/10/21 0318)   lactated ringers 125 mL/hr at 03/10/21 0224   prismasol BGK 0/2.5     prismasol BGK 0/2.5     prismasol BGK 0/2.5     sodium bicarbonate 150 mEq in D5W infusion 125 mL/hr at 03/10/21 0201   PRN Meds:.dextrose, heparin, ondansetron **OR** ondansetron (ZOFRAN) IV, sodium chloride  CBC Recent Labs  Lab 03/09/21 1643 03/09/21 1804  WBC 7.3  --   NEUTROABS 3.9  --   HGB 11.9* 12.6*  HCT 37.4* 37.0*  MCV 117.2*  --   PLT 294  --    Basic Metabolic Panel Recent Labs  Lab 03/09/21 1643 03/09/21 1804 03/09/21 2050 03/10/21 0133  NA 131* 129* 131* 132*  K 5.6* 5.8* 6.7* >7.5*  CL 97*  --  99 98  CO2 7*  --  <7* <7*  GLUCOSE 98  --  64* 56*  BUN 26*  --  28* 29*  CREATININE 1.77*  --  1.90* 2.23*  CALCIUM 9.1  --  8.8* 8.8*    Physical Exam   Blood pressure 117/61, pulse (!) 109, temperature 99.7 F (37.6 C), resp. rate (!) 31, SpO2 100 %. GEN: Young male, normal habitus, and marked respiratory distress ENT: NCAT EYES: EOMI, scleral icterus present CV: Tachypneic, regular PULM: Tachypneic, deep breaths, clear ABD: Soft, nontender SKIN: Jaundice present; R IJ Temp HD cath EXT: 1-2+ edema in legs  Assessment [id number] Severe metabolic acidosis with inc AG, severe hyperkalemia, AKI nl GFR, acute liver injury  Anion gap metabolic acidosis with elevated lactate, likely the primary cause partially from poor hepatic lactate clearance, hepatotoxicity.  No other toxic ingestion noted.  Little to suggest a toxic alcohol.   AKI with normal baseline GFR, likely related to 1, UA not suggestive of GN, imaging without obstruction.  Likely ATN, could have component of HRS physiology.   Severe hyperkalemia secondary to #1, #2; EKG without changes at this time Acute liver injury, question allopurinol  Plan Immediate CRRT, I do not think he is stable for intermittent hemodialysis: 0K all bath to start: 800/2000/500, no UF, no anticoagulation Trend BMP q4h to  adjust K in bath UNa and UP/C Stop LR Will follow closely along  Rexene Agent  03/10/2021, 4:20 AM

## 2021-03-10 NOTE — Consult Note (Signed)
Consultation  Referring Provider:     Luciano Cutter [facility]) Primary Care Physician:  Patient, [name] (Inactive) Primary Gastroenterologist:        Gentry Fitz Reason for Consultation:     Liver failure, elevated liver enzymes         HPI:   Curtis Erickson is a 30 y.o. male with medical history notable for gout, history of pyelonephritis with hospital admission in 09/2019, EtOH use, presenting with 1 week history of progressive weakness, fatigue, LE edema, nausea and nonbloody emesis with decreased appetite/p.o. intake.  No prior similar symptoms.  Does have a known history of gout and was started on allopurinol 1 month ago.  Denies any skin rash.  Denies supplements, herbals, OTC medications.  Does drink 3 wine coolers/day, but no EtOH in the last week or so.  No known family history of liver disease, renal disease.  Admission evaluation notable for tachycardia, tachypnea, BP 105/46, significant electrolyte derangement, AKI/ARF, severe acidosis, elevated liver enzymes with hepatic dysfunction and was admitted to ICU.    Labs/rads on admission notable for the following: - NA 131, K 5.6, bicarb 7, BUN/creatinine 26/1.77 (anion gap 27) - AST/ALT 1528/918, ALP 68, T bili 5.3, albumin 3.7 - WBC 7.3, H/H11.9/37.4, PLT 294, MCV 117 - PT/INR 28/2.6 - Lactate >9 - LDH 995 - Ammonia 64 - TSH 5.7 - Normal/negative EtOH, salicylates, APAP, UDS, acute viral hepatitis panel, HIV, GGT, lipase, CK - CT abdomen/pelvis: Fatty liver, distended GB without GB wall thickening, otherwise normal pancreas, spleen, GI tract.  Mild anasarca with mild pelvic fluid - Ultrasound with Doppler: No e/o cirrhosis, normal flow - CXR with mild cardiomegaly otherwise no pulmonary edema - Pending labs include A1 AT, ceruloplasmin, ANA, ASMA, IgG, EBV, CMV, HSV, ethylene glycol, methanol, acetone  Was started on CRRT, NAC, sodium bicarbonate, broad-spectrum ABX, Levophed.  [facility] was contacted by Critical Care  team overnight and did not recommend transfer due to good mentation.  Did recommend consideration of liver biopsy.   Outpatient labs from 01/31/2021 notable for the following: - NA 140, K3.4, BUN/creatinine 7/0.9 - Albumin 4.1, AST/ALT 29/27, ALP 69, T bili 1.5 (not fractionated) - H/H 12/34.8, MCV/RDW 107/16, PLT 164 - B12 158   Past Medical History:  Diagnosis Date   Hypertension    Kidney stone     Past Surgical History:  Procedure Laterality Date   APPENDECTOMY     KIDNEY STONE SURGERY      No family history on file.   Social History   Tobacco Use   Smoking status: Never   Smokeless tobacco: Never  Vaping Use   Vaping Use: Never used  Substance Use Topics   Alcohol use: Not Currently   Drug use: Not Currently    Prior to Admission medications   Medication Sig Start Date End Date Taking? Authorizing Provider  allopurinol (ZYLOPRIM) 100 MG tablet Take 100 mg by mouth daily. 02/01/21  Yes [provider]  ferrous sulfate 325 (65 FE) MG tablet Take 1 tablet (325 mg total) by mouth 2 (two) times daily with a meal. Patient not taking: Reported on 03/09/2021 09/19/19   Dhungel, Theda Belfast, MD  folic acid (FOLVITE) 1 MG tablet Take 1 tablet (1 mg total) by mouth daily. Patient not taking: Reported on 03/09/2021 09/20/19   Dhungel, Theda Belfast, MD  Multiple Vitamin (MULTIVITAMIN WITH MINERALS) TABS tablet Take 1 tablet by mouth daily. Patient not taking: Reported on 03/09/2021 09/20/19   Dhungel, [place],  MD  thiamine 100 MG tablet Take 1 tablet (100 mg total) by mouth daily. Patient not taking: Reported on 03/09/2021 09/20/19   Dhungel, Theda Belfast, MD  vitamin B-12 1000 MCG tablet Take 1 tablet (1,000 mcg total) by mouth daily. Patient not taking: Reported on 03/09/2021 09/20/19   Eddie North, MD    Current Facility-Administered Medications  Medication Dose Route Frequency Provider Last Rate Last Admin   acetylcysteine (ACETADOTE) 18,000 mg in dextrose 5 % 590 mL (30.5085  mg/mL) infusion  6.25 mg/kg/hr (Order-Specific) Intravenous Continuous Cathie Hoops, RPH 19.67 mL/hr at 03/10/21 1100 6.25 mg/kg/hr at 03/10/21 1100   cefTRIAXone (ROCEPHIN) 2 g in sodium chloride 0.9 % 100 mL IVPB  2 g Intravenous Q24H Vicie Mutters, MD   Stopped at 03/10/21 1008   Chlorhexidine Gluconate Cloth 2 % PADS 6 each  6 each Topical Daily Vicie Mutters, MD   6 each at 03/10/21 0932   dextrose 50 % solution 50 mL  1 ampule Intravenous PRN Rehman, Areeg N, DO       folic acid (FOLVITE) tablet 1 mg  1 mg Oral Daily Vicie Mutters, MD   1 mg at 03/10/21 9147   heparin 10,000 units/ 20 mL infusion syringe  500-1,000 Units/hr CRRT Continuous Maxie Barb, MD 1 mL/hr at 03/10/21 1024 500 Units/hr at 03/10/21 1024   heparin injection 1,000-6,000 Units  1,000-6,000 Units CRRT PRN Arita Miss, MD       norepinephrine (LEVOPHED)  in (0.016 mg/mL) premix infusion  0-40 mcg/min Intravenous Titrated Atway, Rayann N, DO 7.5 mL/hr at 03/10/21 1100 2 mcg/min at 03/10/21 1100   ondansetron (ZOFRAN) tablet 4 mg  4 mg Oral Q6H PRN Rehman, Areeg N, DO       Or   ondansetron (ZOFRAN) injection 4 mg  4 mg Intravenous Q6H PRN Rehman, Areeg N, DO       phytonadione (VITAMIN K) 10 mg in dextrose 5 % 50 mL IVPB  10 mg Intravenous Once Tommie Bohlken V, DO 50 mL/hr at 03/10/21 1100 Infusion Verify at 03/10/21 1100   prismasol BGK 0/2.5 infusion   CRRT Continuous Sabra Heck B, MD 800 mL/hr at 03/10/21 0551 New Bag at 03/10/21 0551   prismasol BGK 0/2.5 infusion   CRRT Continuous Arita Miss, MD 500 mL/hr at 03/10/21 0551 New Bag at 03/10/21 0551   prismasol BGK 0/2.5 infusion   CRRT Continuous Sabra Heck B, MD 2,000 mL/hr at 03/10/21 0551 New Bag at 03/10/21 0551   sodium bicarbonate 150 mEq in dextrose 5 % 1,150 mL infusion   Intravenous Continuous Sabra Heck B, MD 200 mL/hr at 03/10/21 1100 Infusion Verify at 03/10/21 1100   sodium chloride 0.9 % primer  fluid for CRRT   CRRT PRN Arita Miss, MD       thiamine tablet 100 mg  100 mg Oral Daily Vicie Mutters, MD   100 mg at 03/10/21 8295    Allergies as of 03/09/2021   (No Known Allergies)     Review of Systems:    As per HPI, otherwise negative    Physical Exam:  Vital signs in last 24 hours: Temp:  [96.9 F (36.1 C)-99.7 F (37.6 C)] 96.9 F (36.1 C) (12/02 0722) Pulse Rate:  [101-124] 106 (12/02 1000) Resp:  [16-50] 31 (12/02 1000) BP: (103-144)/(36-61) 112/43 (12/02 1000) SpO2:  [69 %-100 %] 100 % (12/02 1000) Weight:  [104.2 kg] 104.2 kg (12/02 0531)   General:   Pleasant  male, sitting upright.  Tachypneic, but well conversive and speaking full sentences Lungs:  Lungs clear to auscultation bilaterally.   No wheezes, crackles, or rhonchi.  Heart: Tachycardic at 106.  Regular rhythm; no MRG Abdomen:  Soft, nondistended, nontender. Normal bowel sounds. No appreciable masses or hepatomegaly.  Rectal:  Not performed.  Extremities: Mild edema bilateral LE and hands Neurologic:  Alert and  oriented x4;  grossly normal neurologically.  No asterixis Skin:  Intact without significant lesions or rashes. Psych:  Alert and cooperative  LAB RESULTS: Recent Labs    03/09/21 1643 03/09/21 1804 03/10/21 0610  WBC 7.3  --  6.3  HGB 11.9* 12.6* 9.9*  HCT 37.4* 37.0* 32.5*  PLT 294  --  174   BMET Recent Labs    03/09/21 2050 03/10/21 0133 03/10/21 0610  NA 131* 132* 129*  K 6.7* >7.5* 6.2*  CL 99 98 95*  CO2 <7* <7* <7*  GLUCOSE 64* 56* 140*  BUN 28* 29* 31*  CREATININE 1.90* 2.23* 2.41*  CALCIUM 8.8* 8.8* 8.1*   LFT Recent Labs    03/09/21 2045 03/09/21 2050 03/10/21 0610  PROT 6.3*   < > 5.5*  ALBUMIN 3.5   < > 3.0*  AST 1,378*   < > 1,713*  ALT 880*   < > 1,003*  ALKPHOS 60   < > 54  BILITOT 5.8*   < > 5.6*  BILIDIR 1.6*  --   --   IBILI 4.2*  --   --    < > = values in this interval not displayed.   PT/INR Recent Labs    03/09/21 2111  03/10/21 0610  LABPROT 29.1* 32.2*  INR 2.8* 3.1*    STUDIES: CT ABDOMEN PELVIS W CONTRAST  Result Date: 03/09/2021 CLINICAL DATA:  Mid abdominal pain EXAM: CT ABDOMEN AND PELVIS WITH CONTRAST TECHNIQUE: Multidetector CT imaging of the abdomen and pelvis was performed using the standard protocol following bolus administration of intravenous contrast. CONTRAST:  65mL OMNIPAQUE IOHEXOL 350 MG/ML SOLN COMPARISON:  CT from 09/17/2019, ultrasound from 09/18/2019 FINDINGS: Lower chest: No acute abnormality. Hepatobiliary: Fatty infiltration of the liver is noted. Gallbladder is well distended without discrete wall thickening or pericholecystic fluid. Pancreas: Unremarkable. No pancreatic ductal dilatation or surrounding inflammatory changes. Spleen: Normal in size without focal abnormality. Adrenals/Urinary Tract: Adrenal glands are within normal limits. Right kidney is well visualize without renal calculi. Normal enhancement pattern is seen. No obstructive changes are noted. The bladder is partially distended. The left kidney demonstrates some patchy enhancement compared to that on the right which may represent some early changes of pyelonephritis. Correlate with laboratory values. No obstructive changes are seen. Stomach/Bowel: Colon shows no obstructive or inflammatory changes. The appendix has been surgically removed. The small bowel and stomach are within normal limits. Vascular/Lymphatic: No significant vascular findings are present. No enlarged abdominal or pelvic lymph nodes. Reproductive: Prostate is unremarkable. Other: Mild free fluid is noted within the pelvis and surrounding the liver new from the prior exam. Mild changes of anasarca are noted as well. Musculoskeletal: No acute or significant osseous findings. IMPRESSION: Patchy enhancement within the left kidney suspicious for pyelonephritis. Correlate with laboratory values. Mild free fluid within the abdomen and pelvis as this is of uncertain  etiology. Mild changes of anasarca are noted as well. Fatty infiltration of the liver. Electronically Signed   By: Alcide Clever M.D.   On: 03/09/2021 18:39   DG Chest Port 1 View  Result Date: 03/10/2021  CLINICAL DATA:  Central line placement. EXAM: PORTABLE CHEST 1 VIEW COMPARISON:  March 09, 2021 (5:43 p.m.) FINDINGS: Interval left internal jugular venous catheter placement is noted with its distal tip seen at the junction of the superior vena cava and right atrium. Decreased lung volumes are seen without evidence of acute infiltrate, pleural effusion or pneumothorax. The heart size and mediastinal contours are within normal limits. The visualized skeletal structures are unremarkable. IMPRESSION: Interval left internal jugular venous catheter placement positioning, as described above. Electronically Signed   By: Aram Candela M.D.   On: 03/10/2021 04:33   DG Chest Portable 1 View  Result Date: 03/09/2021 CLINICAL DATA:  Leg swelling and fever.  Dehydration. EXAM: PORTABLE CHEST 1 VIEW COMPARISON:  09/17/2019 FINDINGS: Numerous leads and wires project over the chest. Apical lordotic positioning. Midline trachea. Mild cardiomegaly. No pleural effusion or pneumothorax. No congestive failure. Clear lungs. IMPRESSION: Mild cardiomegaly, without congestive failure or acute disease. Electronically Signed   By: Jeronimo Greaves M.D.   On: 03/09/2021 18:08   ECHOCARDIOGRAM COMPLETE  Result Date: 03/10/2021    ECHOCARDIOGRAM REPORT   Patient Name:   Curtis Erickson Date of Exam: 03/10/2021 Medical Rec #:  [id number]      Height:       68.0 in Accession #:    [id number]     Weight:       229.7 lb Date of Birth:  [birth year]      BSA:          2.168 m Patient Age:    30 years       BP:           110/38 mmHg Patient Gender: M              HR:           100 bpm. Exam Location:  Inpatient Procedure: 2D Echo, Cardiac Doppler and Color Doppler STAT ECHO Indications:    Cardiomegaly  History:        Patient has no prior  history of Echocardiogram examinations.  Sonographer:    Cleatis Polka Referring Phys: [id number] ERIK C HOFFMAN IMPRESSIONS  1. Left ventricular ejection fraction, by estimation, is 60 to 65%. The left ventricle has normal function. The left ventricle has no regional wall motion abnormalities. Left ventricular diastolic parameters were normal.  2. Right ventricular systolic function is normal. The right ventricular size is mildly enlarged. There is mildly elevated pulmonary artery systolic pressure. The estimated right ventricular systolic pressure is 40.4 mmHg.  3. Right atrial size was mildly dilated.  4. The mitral valve is normal in structure. Trivial mitral valve regurgitation.  5. Tricuspid valve regurgitation is mild to moderate.  6. The aortic valve was not well visualized. Aortic valve regurgitation is not visualized.  7. The inferior vena cava is dilated in size with <50% respiratory variability, suggesting right atrial pressure of 15 mmHg. FINDINGS  Left Ventricle: Left ventricular ejection fraction, by estimation, is 60 to 65%. The left ventricle has normal function. The left ventricle has no regional wall motion abnormalities. The left ventricular internal cavity size was normal in size. There is  no left ventricular hypertrophy. Left ventricular diastolic parameters were normal. Right Ventricle: The right ventricular size is mildly enlarged. No increase in right ventricular wall thickness. Right ventricular systolic function is normal. There is mildly elevated pulmonary artery systolic pressure. The tricuspid regurgitant velocity is 2.52 m/s, and with an assumed right atrial pressure of 15 mmHg, the  estimated right ventricular systolic pressure is 40.4 mmHg. Left Atrium: Left atrial size was normal in size. Right Atrium: Right atrial size was mildly dilated. Pericardium: Trivial pericardial effusion is present. Mitral Valve: The mitral valve is normal in structure. Trivial mitral valve regurgitation.  Tricuspid Valve: The tricuspid valve is normal in structure. Tricuspid valve regurgitation is mild to moderate. Aortic Valve: The aortic valve was not well visualized. Aortic valve regurgitation is not visualized. Aortic valve peak gradient measures 12.1 mmHg. Pulmonic Valve: The pulmonic valve was not well visualized. Pulmonic valve regurgitation is not visualized. Aorta: The aortic root and ascending aorta are structurally normal, with no evidence of dilitation. Venous: The inferior vena cava is dilated in size with less than 50% respiratory variability, suggesting right atrial pressure of 15 mmHg. IAS/Shunts: The interatrial septum was not well visualized.  LEFT VENTRICLE PLAX 2D LVIDd:         4.80 cm      Diastology LVIDs:         2.80 cm      LV e' medial:    8.49 cm/s LV PW:         1.10 cm      LV E/e' medial:  13.2 LV IVS:        0.90 cm      LV e' lateral:   11.10 cm/s LVOT diam:     2.10 cm      LV E/e' lateral: 10.1 LV SV:         97 LV SV Index:   45 LVOT Area:     3.46 cm  LV Volumes (MOD) LV vol d, MOD A2C: 141.0 ml LV vol d, MOD A4C: 144.0 ml LV vol s, MOD A2C: 51.5 ml LV vol s, MOD A4C: 51.5 ml LV SV MOD A2C:     89.5 ml LV SV MOD A4C:     144.0 ml LV SV MOD BP:      91.9 ml RIGHT VENTRICLE             IVC RV Basal diam:  4.10 cm     IVC diam: 2.90 cm RV Mid diam:    3.10 cm RV S prime:     10.80 cm/s TAPSE (M-mode): 1.9 cm LEFT ATRIUM             Index        RIGHT ATRIUM           Index LA diam:        4.40 cm 2.03 cm/m   RA Area:     23.40 cm LA Vol (A2C):   61.2 ml 28.23 ml/m  RA Volume:   77.70 ml  35.84 ml/m LA Vol (A4C):   58.1 ml 26.80 ml/m LA Biplane Vol: 62.1 ml 28.65 ml/m  AORTIC VALVE AV Area (Vmax): 3.48 cm AV Vmax:        174.00 cm/s AV Peak Grad:   12.1 mmHg LVOT Vmax:      175.00 cm/s LVOT Vmean:     114.000 cm/s LVOT VTI:       0.280 m  AORTA Ao Root diam: 2.40 cm Ao Asc diam:  2.50 cm MITRAL VALVE                TRICUSPID VALVE MV Area (PHT): 5.06 cm     TR Peak grad:    25.4 mmHg MV Decel Time: 150 msec     TR Vmax:  252.00 cm/s MV E velocity: 112.00 cm/s MV A velocity: 61.60 cm/s   SHUNTS MV E/A ratio:  1.82         Systemic VTI:  0.28 m                             Systemic Diam: 2.10 cm Epifanio Lesches MD Electronically signed by Epifanio Lesches MD Signature Date/Time: 03/10/2021/10:15:52 AM    Final    US LIVER DOPPLER  Result Date: 03/10/2021 CLINICAL DATA:  Acute liver failure EXAM: DUPLEX ULTRASOUND OF LIVER TECHNIQUE: Color and duplex Doppler ultrasound was performed to evaluate the hepatic in-flow and out-flow vessels. COMPARISON:  03/09/2021 FINDINGS: Liver: Mildly heterogeneous liver. Normal hepatic contour without nodularity. No focal lesion, mass or intrahepatic biliary ductal dilatation. Main Portal Vein size: 0.9 cm Portal Vein Velocities Main Prox:  20 cm/sec Main Mid: 14 cm/sec Main Dist:  19 cm/sec Right: 13 cm/sec Left: 16 cm/sec Hepatic Vein Velocities Right:  44 cm/sec Middle:  62 cm/sec Left:  72 cm/sec IVC: Present and patent with normal respiratory phasicity. Hepatic Artery Velocity:  218 cm/sec Splenic Vein Velocity:  23 cm/sec Spleen: 13 cm x 3.6 cm x 4.9 cm with a total volume of 121 cm^3 (411 cm^3 is upper limit normal) Portal Vein Occlusion/Thrombus: No Splenic Vein Occlusion/Thrombus: No Ascites: None Varices: None Negative for portal vein occlusion or thrombus. Limited exam because of patient's condition. IMPRESSION: Patent portal, hepatic and splenic veins with normal directional flow. Electronically Signed   By: Judie Petit.  Shick M.D.   On: 03/10/2021 07:35     PREVIOUS ENDOSCOPIES:            None   Impression / Plan:   1) Elevated liver enzymes 2) Hyperbilirubinemia 3) Coagulopathy  - Significant acute liver injury.  Currently most suspicious insult would be DILI 2/2 allopurinol, but extended serologic evaluation underway for concomitant liver disease.  No concomitant dermatologic manifestations to suggest DRESS syndrome -  Significantly elevated liver enzymes which have slightly up trended today with AST/ALT 1713/1003 (from 1560/932) - T bili relatively stable at 5.6 (from 6.0 and 5.5 prior to that) with otherwise normal ALP - Although he does report drinking 3 wine coolers/day, admission EtOH was negative.  No radiographic e/o cirrhosis on admission and prior labs from 01/31/2021 only notable for T bili 1.5 (was not fractionated) but otherwise normal renal function, normal AST/ALT, normal albumin, normal PLT, so no suspicion for chronic impaired hepatic synthetic function (i.e. cirrhosis) - Currently has normal mentation - Continue N-acetylcysteine - IV vitamin K - Continue trending liver enzymes, INR - Will follow-up on multiple pending serologies for concomitant liver insult. Added IgM, IgA - Please send hepatitis E IgM - Holding off on prednisolone for now as EtOH hepatitis less suspicious than other etiologies and possibility of active infection  4) AGMA/Lactic acidosis 5) Hyperkalemia 6) Hyponatremia 7) AKI - Started on CRRT - Electrolyte management per PCCM and Nephrology services  8) Multiple SIRS criteria/Shock physiology - Started on Rocephin for possible pyelonephritis - Cultures pending - Was started on Levophed today  9) Macrocytic anemia - His macrocytic anemia is chronic with prior B12 deficiency in 01/2021 (158) which was improved on outpatient labs earlier yesterday (1127) - Check folate - Thiamine, MVI - No e/o recent or active GI bleed  - GI service will continue to follow   Doristine Locks, DO, [facility]    LOS: 1 day  Curtis Erickson  03/10/2021, 11:04 AM

## 2021-03-10 NOTE — Progress Notes (Signed)
NAME:  Curtis Erickson, MRN:  [id number], DOB:  05-07-1990, LOS: 1 ADMISSION DATE:  03/09/2021, CONSULTATION DATE:  03/09/21 REFERRING MD:  Mikey Bussing, CHIEF COMPLAINT:  Liver injury   History of Present Illness:  Curtis Erickson is a 30 y.o. male who has no significant PMH besides gout.  He presented to [facility] ED 12/1 with weakness, fatigue, LE edema, nausea, vomiting for roughly 1 week.  Also noticed that his respiratory rate had increased for past several days. Last BM 1 day before presentation, diarrhea like consistency. He apparently started to feel very dehydrated and had no appetite for food or drink so he went to PCP 12/1 where he had abnormal labs including concern for acute liver failure and was subsequently sent to ED.  He was started on Allopurinol roughly 1 month ago for gout flare. This was stopped 12/1. Denies any drug use.  Does drink alcohol, normally wine coolers but last drink was over 1 week ago.  No additional medications.  No exposures to known sick contacts.    Pertinent  Medical History  Gout, acute pyelonephritis, B12 deficiency, folate deficiency, iron deficiency, ETOH abuse, alcoholic fatty liver disease, acute hepatitis,   Significant Hospital Events: Including procedures, antibiotic start and stop dates in addition to other pertinent events   12/1 admit  Interim History / Subjective:  Overnight patient was started on CRRT for severe electrolyte abnormalities. Patient remains tachycardic and tachypneic on room air, but mental status remains unchanged.   Objective   Blood pressure (!) 105/46, pulse (!) 101, temperature 97.6 F (36.4 C), temperature source Oral, resp. rate (!) 38, height 5\' 8"  (1.727 m), weight 104.2 kg, SpO2 100 %.        Intake/Output Summary (Last 24 hours) at 03/10/2021 0707 Last data filed at 03/10/2021 0700 Gross per 24 hour  Intake 5425.69 ml  Output 0 ml  Net 5425.69 ml   Filed Weights   03/10/21 0531  Weight: 104.2 kg     Examination: General: Normal appearance, no acute distress HENT: Mild scleral icterus. EOM intact Lungs: Respirations clear and not labored. Normal WOB on room air  Cardiovascular: Tachycardic rate, regular rhythm.  Abdomen: Bowel sounds normal. Abd firm and distended Extremities: No edema Neuro: A&Ox3 GU: Foley catheter in place, urine is clear  Resolved Hospital Problem list   N/A  Assessment & Plan:  Drug induced liver injury likely 2/2 Allopurinol toxicity- PT/INR elevated to 32.2/3.1. Acute hepatitis panel negative for hep A-C. MELD score 36, estimated 3 month mortality is 52.6%. Started on NAC overnight. A - GI consulted - Ceruloplasmin and alpha-1-antitrypsin pending - ANA, EBV, HSV CMV pending - Trend PT/INR daily - Start vitamin K - Hold off on steroids, continue NAC - Liver biopsy when patient is more stable - Consider transfer to [facility] if mental status changes  Anion gap metabolic acidosis with compensatory respiratory alkalosis - likely in the setting of AKI and lactic acidosis from decreased liver clearance secondary to DILI. Patient is s/p 5 L IVF.  AKI with hyperkalemia- K >7.5 overnight with peaked T waves noted on EKG. Patient had non-tunneled central venous catheter placed and went for immediate CRRT overnight. Still elevated this morning, although improved to 6.2.  - Restart CRRT as soon as heparin initiated - Insulin/D50 temporizing measures now - Sodium bicarb gtt  Septic shock with concern for pyelonephritis: UA negative for leukocytes or nitrites. Patient endorsing some flank pain + chills. Foley catheter in place.  - Start rocephin  -  Blood and urine cultures pending - Follow up echo - Trend lactic acid q8h - Start levophed  5. Alcohol use: Start thiamine and folic acid. No concern for withdrawal at this time, last drink was ~7 days ago.   6. Anemia: Hb dropped from 12.6 to 9.9, could be secondary to volume of IVF resuscitation. No signs of active  bleeding on exam - Trend CBC twice daily   Best Practice (right click and "Reselect all SmartList Selections" daily)   Diet/type: clear liquids DVT prophylaxis: otherhold  GI prophylaxis: N/A Lines: Dialysis Catheter Foley:  Yes, and it is still needed Code Status:  full code Last date of multidisciplinary goals of care discussion [N/A] Dispo: Remain in ICU  Labs   CBC: Recent Labs  Lab 03/09/21 1643 03/09/21 1804 03/10/21 0610  WBC 7.3  --  6.3  NEUTROABS 3.9  --  PENDING  HGB 11.9* 12.6* 9.9*  HCT 37.4* 37.0* 32.5*  MCV 117.2*  --  121.3*  PLT 294  --  174    Basic Metabolic Panel: Recent Labs  Lab 03/09/21 1643 03/09/21 1804 03/09/21 2050 03/10/21 0133  NA 131* 129* 131* 132*  K 5.6* 5.8* 6.7* >7.5*  CL 97*  --  99 98  CO2 7*  --  <7* <7*  GLUCOSE 98  --  64* 56*  BUN 26*  --  28* 29*  CREATININE 1.77*  --  1.90* 2.23*  CALCIUM 9.1  --  8.8* 8.8*  MG 1.9  --   --   --    GFR: Estimated Creatinine Clearance: 57.2 mL/min (A) (by C-G formula based on SCr of 2.23 mg/dL (H)). Recent Labs  Lab 03/09/21 1643 03/09/21 2125 03/10/21 0103 03/10/21 0610  WBC 7.3  --   --  6.3  LATICACIDVEN  --  >9.0* >9.0*  --     Liver Function Tests: Recent Labs  Lab 03/09/21 1643 03/09/21 2045 03/09/21 2050 03/10/21 0133  AST 1,528* 1,378* 1,443* 1,560*  ALT 918* 880* 913* 932*  ALKPHOS 68 60 62 65  BILITOT 5.3* 5.8* 5.5* 6.0*  PROT 6.5 6.3* 6.0* 6.1*  ALBUMIN 3.7 3.5 3.5 3.4*   Recent Labs  Lab 03/09/21 1643  LIPASE 37   Recent Labs  Lab 03/09/21 1643  AMMONIA 64*    ABG    Component Value Date/Time   PHART 7.141 (LL) 03/10/2021 0225   PCO2ART <19.0 (LL) 03/10/2021 0225   PO2ART 111 (H) 03/10/2021 0225   HCO3 3.4 (L) 03/10/2021 0225   TCO2 8 (L) 03/09/2021 1804   ACIDBASEDEF 24.7 (H) 03/10/2021 0225   O2SAT 94.6 03/10/2021 0225     Coagulation Profile: Recent Labs  Lab 03/09/21 1735 03/09/21 2111  INR 2.6* 2.8*    Cardiac  Enzymes: Recent Labs  Lab 03/09/21 1643  CKTOTAL 219    HbA1C: Hgb A1c MFr Bld  Date/Time Value Ref Range Status  03/09/2021 08:50 PM 4.6 (L) 4.8 - 5.6 % Final    Comment:    (NOTE) Pre diabetes:          5.7%-6.4%  Diabetes:              >6.4%  Glycemic control for   <7.0% adults with diabetes   09/17/2019 07:02 PM 4.9 4.8 - 5.6 % Final    Comment:    (NOTE) Pre diabetes:          5.7%-6.4%  Diabetes:              >  6.4%  Glycemic control for   <7.0% adults with diabetes     CBG: Recent Labs  Lab 03/10/21 0125 03/10/21 0442  GLUCAP 51* 157*    Review of Systems: Pertinent positives  Constitutional: fatigue, weakness GI: n/v, flank pain   Past Medical History:  He,  has a past medical history of Hypertension and Kidney stone.   Surgical History:   Past Surgical History:  Procedure Laterality Date   APPENDECTOMY     KIDNEY STONE SURGERY       Social History:   reports that he has never smoked. He has never used smokeless tobacco. He reports that he does not currently use alcohol. He reports that he does not currently use drugs.   Family History:  His family history is not on file.   Allergies No Known Allergies   Home Medications  Prior to Admission medications   Medication Sig Start Date End Date Taking? Authorizing Provider  allopurinol (ZYLOPRIM) 100 MG tablet Take 100 mg by mouth daily. 02/01/21  Yes [provider]  ferrous sulfate 325 (65 FE) MG tablet Take 1 tablet (325 mg total) by mouth 2 (two) times daily with a meal. Patient not taking: Reported on 03/09/2021 09/19/19   Dhungel, Theda Belfast, MD  folic acid (FOLVITE) 1 MG tablet Take 1 tablet (1 mg total) by mouth daily. Patient not taking: Reported on 03/09/2021 09/20/19   Dhungel, Theda Belfast, MD  Multiple Vitamin (MULTIVITAMIN WITH MINERALS) TABS tablet Take 1 tablet by mouth daily. Patient not taking: Reported on 03/09/2021 09/20/19   Dhungel, Theda Belfast, MD  thiamine 100 MG tablet Take 1  tablet (100 mg total) by mouth daily. Patient not taking: Reported on 03/09/2021 09/20/19   Dhungel, Theda Belfast, MD  vitamin B-12 1000 MCG tablet Take 1 tablet (1,000 mcg total) by mouth daily. Patient not taking: Reported on 03/09/2021 09/20/19   Eddie North, MD     Critical care time: 30 minutes

## 2021-03-10 NOTE — Progress Notes (Signed)
PCCM Interval Progress Note  Ongoing profound acidosis now with critical hyperkalemia. Temporizing measures given by primary team. Fortunately, mental status remains good.   Nephrology called for emergent HD vs CRRT.  They will see him shortly. HD just placed. Continues to compensate respiratory wise, hopefully his tachypnea and comfort level will improve as his acidosis clears with HD / CRRT.  Transfer to ICU.   Rutherford Guys, PA - [facility] For pager details, please see AMION or use Epic chat  After 1900, please call [facility]INK for cross coverage needs 03/10/2021, 4:08 AM

## 2021-03-11 LAB — CBC
HCT: 29.7 % — ABNORMAL LOW (ref 39.0–52.0)
HCT: 33.6 % — ABNORMAL LOW (ref 39.0–52.0)
Hemoglobin: 9.9 g/dL — ABNORMAL LOW (ref 13.0–17.0)
Hemoglobin: 11.2 g/dL — ABNORMAL LOW (ref 13.0–17.0)
MCH: 36.7 pg — ABNORMAL HIGH (ref 26.0–34.0)
MCH: 37.6 pg — ABNORMAL HIGH (ref 26.0–34.0)
MCHC: 33.3 g/dL (ref 30.0–36.0)
MCHC: 33.3 g/dL (ref 30.0–36.0)
MCV: 110 fL — ABNORMAL HIGH (ref 80.0–100.0)
MCV: 112.8 fL — ABNORMAL HIGH (ref 80.0–100.0)
Platelets: 81 10*3/uL — ABNORMAL LOW (ref 150–400)
Platelets: 79 10*3/uL — ABNORMAL LOW (ref 150–400)
RBC: 2.7 MIL/uL — ABNORMAL LOW (ref 4.22–5.81)
RBC: 2.98 MIL/uL — ABNORMAL LOW (ref 4.22–5.81)
RDW: 16.4 % — ABNORMAL HIGH (ref 11.5–15.5)
RDW: 16.5 % — ABNORMAL HIGH (ref 11.5–15.5)
WBC: 2.5 10*3/uL — ABNORMAL LOW (ref 4.0–10.5)
WBC: 2 10*3/uL — ABNORMAL LOW (ref 4.0–10.5)
nRBC: 1.2 % — ABNORMAL HIGH (ref 0.0–0.2)
nRBC: 2 % — ABNORMAL HIGH (ref 0.0–0.2)

## 2021-03-11 LAB — BASIC METABOLIC PANEL
Anion gap: 12 (ref 5–15)
BUN: 10 mg/dL (ref 6–20)
CO2: 24 mmol/L (ref 22–32)
Calcium: 8.2 mg/dL — ABNORMAL LOW (ref 8.9–10.3)
Chloride: 100 mmol/L (ref 98–111)
Creatinine, Ser: 0.8 mg/dL (ref 0.61–1.24)
GFR, Estimated: >60 mL/min (ref >60)
Glucose, Bld: 168 mg/dL — ABNORMAL HIGH (ref 70–99)
Potassium: 3.4 mmol/L — ABNORMAL LOW (ref 3.5–5.1)
Sodium: 136 mmol/L (ref 135–145)

## 2021-03-11 LAB — GLUCOSE, CAPILLARY
Glucose-Capillary: 95 mg/dL (ref 70–99)
Glucose-Capillary: 89 mg/dL (ref 70–99)
Glucose-Capillary: 88 mg/dL (ref 70–99)
Glucose-Capillary: 148 mg/dL — ABNORMAL HIGH (ref 70–99)
Glucose-Capillary: 128 mg/dL — ABNORMAL HIGH (ref 70–99)

## 2021-03-11 LAB — COMPREHENSIVE METABOLIC PANEL
ALT: 1269 U/L — ABNORMAL HIGH (ref 0–44)
AST: 1754 U/L — ABNORMAL HIGH (ref 15–41)
Albumin: 2.6 g/dL — ABNORMAL LOW (ref 3.5–5.0)
Alkaline Phosphatase: 58 U/L (ref 38–126)
Anion gap: 10 (ref 5–15)
BUN: 18 mg/dL (ref 6–20)
CO2: 27 mmol/L (ref 22–32)
Calcium: 8 mg/dL — ABNORMAL LOW (ref 8.9–10.3)
Chloride: 98 mmol/L (ref 98–111)
Creatinine, Ser: 1.05 mg/dL (ref 0.61–1.24)
GFR, Estimated: >60 mL/min (ref >60)
Glucose, Bld: 95 mg/dL (ref 70–99)
Potassium: 3.4 mmol/L — ABNORMAL LOW (ref 3.5–5.1)
Sodium: 135 mmol/L (ref 135–145)
Total Bilirubin: 4.7 mg/dL — ABNORMAL HIGH (ref 0.3–1.2)
Total Protein: 4.7 g/dL — ABNORMAL LOW (ref 6.5–8.1)

## 2021-03-11 LAB — EPSTEIN-BARR VIRUS VCA, IGM: EBV VCA IgM: <36 U/mL (ref 0.0–35.9)

## 2021-03-11 LAB — CMV IGM: CMV IgM: <30 AU/mL (ref 0.0–29.9)

## 2021-03-11 LAB — RENAL FUNCTION PANEL
Albumin: 2.6 g/dL — ABNORMAL LOW (ref 3.5–5.0)
Anion gap: 9 (ref 5–15)
BUN: 8 mg/dL (ref 6–20)
CO2: 24 mmol/L (ref 22–32)
Calcium: 8.2 mg/dL — ABNORMAL LOW (ref 8.9–10.3)
Chloride: 103 mmol/L (ref 98–111)
Creatinine, Ser: 0.7 mg/dL (ref 0.61–1.24)
GFR, Estimated: >60 mL/min (ref >60)
Glucose, Bld: 145 mg/dL — ABNORMAL HIGH (ref 70–99)
Phosphorus: 1.5 mg/dL — ABNORMAL LOW (ref 2.5–4.6)
Potassium: 3.2 mmol/L — ABNORMAL LOW (ref 3.5–5.1)
Sodium: 136 mmol/L (ref 135–145)

## 2021-03-11 LAB — URINE CULTURE: Culture: NO GROWTH

## 2021-03-11 LAB — MAGNESIUM: Magnesium: 2 mg/dL (ref 1.7–2.4)

## 2021-03-11 LAB — PROTIME-INR
INR: 2 — ABNORMAL HIGH (ref 0.8–1.2)
Prothrombin Time: 22.2 seconds — ABNORMAL HIGH (ref 11.4–15.2)

## 2021-03-11 LAB — ALPHA-1-ANTITRYPSIN: A-1 Antitrypsin, Ser: 252 mg/dL — ABNORMAL HIGH (ref 95–164)

## 2021-03-11 LAB — IGM: IgM (Immunoglobulin M), Srm: 71 mg/dL (ref 20–172)

## 2021-03-11 LAB — IGA: IgA: 188 mg/dL (ref 90–386)

## 2021-03-11 LAB — LACTIC ACID, PLASMA
Lactic Acid, Venous: 3.5 mmol/L (ref 0.5–1.9)
Lactic Acid, Venous: 3.5 mmol/L (ref 0.5–1.9)
Lactic Acid, Venous: 3.7 mmol/L (ref 0.5–1.9)

## 2021-03-11 LAB — CERULOPLASMIN: Ceruloplasmin: 31 mg/dL (ref 16.0–31.0)

## 2021-03-11 LAB — IGG: IgG (Immunoglobin G), Serum: 908 mg/dL (ref 603–1613)

## 2021-03-11 LAB — PHOSPHORUS: Phosphorus: 2.2 mg/dL — ABNORMAL LOW (ref 2.5–4.6)

## 2021-03-11 LAB — ANA W/REFLEX IF POSITIVE: Anti Nuclear Antibody (ANA): NEGATIVE

## 2021-03-11 MED ORDER — SODIUM CHLORIDE 0.9% FLUSH: 10.0000 mL | INTRAVENOUS | Status: DC | PRN

## 2021-03-11 MED ORDER — PRISMASOL BGK 4/2.5 32-4-2.5 MEQ/L REPLACEMENT SOLN
Status: DC
  Filled 2021-03-11 (×3): qty 5000

## 2021-03-11 MED ORDER — POTASSIUM CHLORIDE 10 MEQ/50ML IV SOLN
10.0000 meq | INTRAVENOUS | Status: AC
  Administered 2021-03-11 (×4): 10 meq via INTRAVENOUS
  Filled 2021-03-11 (×4): qty 50

## 2021-03-11 MED ORDER — SODIUM CHLORIDE 0.9% FLUSH
10.0000 mL | Freq: Two times a day (BID) | INTRAVENOUS | Status: DC
  Administered 2021-03-11 – 2021-03-13 (×6): 10 mL
  Administered 2021-03-13: 20 mL
  Administered 2021-03-14: 10 mL

## 2021-03-11 MED ORDER — POTASSIUM CHLORIDE CRYS ER 20 MEQ PO TBCR
40.0000 meq | EXTENDED_RELEASE_TABLET | Freq: Once | ORAL | Status: AC
  Administered 2021-03-11: 40 meq via ORAL
  Filled 2021-03-11: qty 2

## 2021-03-11 MED ORDER — SODIUM PHOSPHATES 45 MMOLE/15ML IV SOLN
25.0000 mmol | Freq: Once | INTRAVENOUS | Status: AC
  Administered 2021-03-11: 25 mmol via INTRAVENOUS
  Filled 2021-03-11: qty 8.33

## 2021-03-11 MED ORDER — SODIUM PHOSPHATES 45 MMOLE/15ML IV SOLN
30.0000 mmol | Freq: Once | INTRAVENOUS | Status: AC
  Administered 2021-03-11: 30 mmol via INTRAVENOUS
  Filled 2021-03-11: qty 10

## 2021-03-11 NOTE — Plan of Care (Signed)
  Problem: Education: Goal: Knowledge of General Education information will improve Description: Including pain rating scale, medication(s)/side effects and non-pharmacologic comfort measures Outcome: Progressing   Problem: Clinical Measurements: Goal: Diagnostic test results will improve Outcome: Progressing   Problem: Activity: Goal: Risk for activity intolerance will decrease Outcome: Progressing   Problem: Pain Managment: Goal: General experience of comfort will improve Outcome: Not Progressing

## 2021-03-11 NOTE — Progress Notes (Signed)
Multiple attempts to collect blood from patient were made by Lab. Labs pulled from trialysis cath while machine was stopped d/t pt refusing to be stuck. Continuing to monitor patient.

## 2021-03-11 NOTE — Progress Notes (Signed)
eLink Physician-Brief Progress Note Patient Name: Curtis Erickson DOB: Aug 14, 1990 MRN: [id number]   Date of Service  03/11/2021  HPI/Events of Note  Phosphorus was 1.5 at 16:19 hours  and I can't see that it was addressed. Do you want to treat it now or check labs?  eICU Interventions  Check BMP, k was low in AM, Mag and po4 level, if low will replace.      Intervention Category Intermediate Interventions: Electrolyte abnormality - evaluation and management  Ranee Gosselin 03/11/2021, 8:27 PM

## 2021-03-11 NOTE — Progress Notes (Signed)
Lab results called to Dr. Ronalee Belts. Instructed to change the post-filter bags to 4/2.5. Order placed and bag hung. Continuing to monitor patient.

## 2021-03-11 NOTE — Progress Notes (Signed)
eLink Physician-Brief Progress Note Patient Name: DARRAN GABAY DOB: 1990/08/25 MRN: [id number]   Date of Service  03/11/2021  HPI/Events of Note  RN asks you to renew the cardiac monitoring order that will expire in 30 minutes. Fulminant hepatitis. Electrolyte imbalancePancytopenia   eICU Interventions  renewed     Intervention Category Intermediate Interventions: Other:  Ranee Gosselin 03/11/2021, 8:20 PM

## 2021-03-11 NOTE — Progress Notes (Signed)
PROGRESS NOTE FOR [facility]  Subjective: No complaints.  Feeling well.  Objective: Vital signs in last 24 hours: Temp:  [97.3 F (36.3 C)-99.8 F (37.7 C)] 98.7 F (37.1 C) (12/03 0800) Pulse Rate:  [98-124] 111 (12/03 0800) Resp:  [11-42] 11 (12/03 0800) BP: (104-147)/(43-73) 129/62 (12/03 0800) SpO2:  [90 %-100 %] 96 % (12/03 0800) Weight:  [104 kg] 104 kg (12/03 0234) Last BM Date:  (pta)  Intake/Output from previous day: 12/02 0701 - 12/03 0700 In: 2909.1 [P.O.:100; I.V.:2675.9; IV Piggyback:133.2] Out: 3676 [Urine:1050] Intake/Output this shift: Total I/O In: 22.9 [I.V.:19.6; IV Piggyback:3.3] Out: 268 [Urine:100; Other:168]  General appearance: alert and no distress Resp: clear to auscultation bilaterally Cardio: regular rate and rhythm and tachycardic GI: soft, non-tender; bowel sounds normal; no masses,  no organomegaly Extremities: edematous hands  Lab Results: Recent Labs    03/10/21 0610 03/10/21 1701 03/11/21 0305  WBC 6.3 4.2 2.5*  HGB 9.9* 10.0* 9.9*  HCT 32.5* 29.9* 29.7*  PLT 174 112* 81*   BMET Recent Labs    03/10/21 1701 03/10/21 2000 03/11/21 0305  NA 133* 133* 135  K 4.0 3.5 3.4*  CL 94* 94* 98  CO2 20* 23 27  GLUCOSE 121* 98 95  BUN 27* 23* 18  CREATININE 1.74* 1.55* 1.05  CALCIUM 8.2* 8.3* 8.0*   LFT Recent Labs    03/09/21 2045 03/09/21 2050 03/11/21 0305  PROT 6.3*   < > 4.7*  ALBUMIN 3.5   < > 2.6*  AST 1,378*   < > 1,754*  ALT 880*   < > 1,269*  ALKPHOS 60   < > 58  BILITOT 5.8*   < > 4.7*  BILIDIR 1.6*  --   --   IBILI 4.2*  --   --    < > = values in this interval not displayed.   PT/INR Recent Labs    03/10/21 0610 03/10/21 2111  LABPROT 32.2* 26.2*  INR 3.1* 2.4*   Hepatitis Panel Recent Labs    03/09/21 1809  HEPBSAG NON REACTIVE  HCVAB NON REACTIVE  HEPAIGM NON REACTIVE  HEPBIGM NON REACTIVE   C-Diff No results for input(s): CDIFFTOX in the last 72 hours. Fecal Lactopherrin No results for  input(s): FECLLACTOFRN in the last 72 hours.  Studies/Results: CT ABDOMEN PELVIS W CONTRAST  Result Date: 03/09/2021 CLINICAL DATA:  Mid abdominal pain EXAM: CT ABDOMEN AND PELVIS WITH CONTRAST TECHNIQUE: Multidetector CT imaging of the abdomen and pelvis was performed using the standard protocol following bolus administration of intravenous contrast. CONTRAST:  75mL OMNIPAQUE IOHEXOL 350 MG/ML SOLN COMPARISON:  CT from 09/17/2019, ultrasound from 09/18/2019 FINDINGS: Lower chest: No acute abnormality. Hepatobiliary: Fatty infiltration of the liver is noted. Gallbladder is well distended without discrete wall thickening or pericholecystic fluid. Pancreas: Unremarkable. No pancreatic ductal dilatation or surrounding inflammatory changes. Spleen: Normal in size without focal abnormality. Adrenals/Urinary Tract: Adrenal glands are within normal limits. Right kidney is well visualize without renal calculi. Normal enhancement pattern is seen. No obstructive changes are noted. The bladder is partially distended. The left kidney demonstrates some patchy enhancement compared to that on the right which may represent some early changes of pyelonephritis. Correlate with laboratory values. No obstructive changes are seen. Stomach/Bowel: Colon shows no obstructive or inflammatory changes. The appendix has been surgically removed. The small bowel and stomach are within normal limits. Vascular/Lymphatic: No significant vascular findings are present. No enlarged abdominal or pelvic lymph nodes. Reproductive: Prostate is unremarkable. Other: Mild  free fluid is noted within the pelvis and surrounding the liver new from the prior exam. Mild changes of anasarca are noted as well. Musculoskeletal: No acute or significant osseous findings. IMPRESSION: Patchy enhancement within the left kidney suspicious for pyelonephritis. Correlate with laboratory values. Mild free fluid within the abdomen and pelvis as this is of uncertain  etiology. Mild changes of anasarca are noted as well. Fatty infiltration of the liver. Electronically Signed   By: Alcide Clever M.D.   On: 03/09/2021 18:39   DG Chest Port 1 View  Result Date: 03/10/2021 CLINICAL DATA:  Central line placement. EXAM: PORTABLE CHEST 1 VIEW COMPARISON:  March 09, 2021 (5:43 p.m.) FINDINGS: Interval left internal jugular venous catheter placement is noted with its distal tip seen at the junction of the superior vena cava and right atrium. Decreased lung volumes are seen without evidence of acute infiltrate, pleural effusion or pneumothorax. The heart size and mediastinal contours are within normal limits. The visualized skeletal structures are unremarkable. IMPRESSION: Interval left internal jugular venous catheter placement positioning, as described above. Electronically Signed   By: Aram Candela M.D.   On: 03/10/2021 04:33   DG Chest Portable 1 View  Result Date: 03/09/2021 CLINICAL DATA:  Leg swelling and fever.  Dehydration. EXAM: PORTABLE CHEST 1 VIEW COMPARISON:  09/17/2019 FINDINGS: Numerous leads and wires project over the chest. Apical lordotic positioning. Midline trachea. Mild cardiomegaly. No pleural effusion or pneumothorax. No congestive failure. Clear lungs. IMPRESSION: Mild cardiomegaly, without congestive failure or acute disease. Electronically Signed   By: Jeronimo Greaves M.D.   On: 03/09/2021 18:08   ECHOCARDIOGRAM COMPLETE  Result Date: 03/10/2021    ECHOCARDIOGRAM REPORT   Patient Name:   Curtis Erickson Kliewer Date of Exam: 03/10/2021 Medical Rec #:  [id number]      Height:       68.0 in Accession #:    [id number]     Weight:       229.7 lb Date of Birth:  1990-09-18      BSA:          2.168 m Patient Age:    30 years       BP:           110/38 mmHg Patient Gender: M              HR:           100 bpm. Exam Location:  Inpatient Procedure: 2D Echo, Cardiac Doppler and Color Doppler STAT ECHO Indications:    Cardiomegaly  History:        Patient has no prior  history of Echocardiogram examinations.  Sonographer:    Cleatis Polka Referring Phys: [id number] ERIK C HOFFMAN IMPRESSIONS  1. Left ventricular ejection fraction, by estimation, is 60 to 65%. The left ventricle has normal function. The left ventricle has no regional wall motion abnormalities. Left ventricular diastolic parameters were normal.  2. Right ventricular systolic function is normal. The right ventricular size is mildly enlarged. There is mildly elevated pulmonary artery systolic pressure. The estimated right ventricular systolic pressure is 40.4 mmHg.  3. Right atrial size was mildly dilated.  4. The mitral valve is normal in structure. Trivial mitral valve regurgitation.  5. Tricuspid valve regurgitation is mild to moderate.  6. The aortic valve was not well visualized. Aortic valve regurgitation is not visualized.  7. The inferior vena cava is dilated in size with <50% respiratory variability, suggesting right atrial pressure of 15 mmHg. FINDINGS  Left Ventricle: Left ventricular ejection fraction, by estimation, is 60 to 65%. The left ventricle has normal function. The left ventricle has no regional wall motion abnormalities. The left ventricular internal cavity size was normal in size. There is  no left ventricular hypertrophy. Left ventricular diastolic parameters were normal. Right Ventricle: The right ventricular size is mildly enlarged. No increase in right ventricular wall thickness. Right ventricular systolic function is normal. There is mildly elevated pulmonary artery systolic pressure. The tricuspid regurgitant velocity is 2.52 m/s, and with an assumed right atrial pressure of 15 mmHg, the estimated right ventricular systolic pressure is 40.4 mmHg. Left Atrium: Left atrial size was normal in size. Right Atrium: Right atrial size was mildly dilated. Pericardium: Trivial pericardial effusion is present. Mitral Valve: The mitral valve is normal in structure. Trivial mitral valve regurgitation.  Tricuspid Valve: The tricuspid valve is normal in structure. Tricuspid valve regurgitation is mild to moderate. Aortic Valve: The aortic valve was not well visualized. Aortic valve regurgitation is not visualized. Aortic valve peak gradient measures 12.1 mmHg. Pulmonic Valve: The pulmonic valve was not well visualized. Pulmonic valve regurgitation is not visualized. Aorta: The aortic root and ascending aorta are structurally normal, with no evidence of dilitation. Venous: The inferior vena cava is dilated in size with less than 50% respiratory variability, suggesting right atrial pressure of 15 mmHg. IAS/Shunts: The interatrial septum was not well visualized.  LEFT VENTRICLE PLAX 2D LVIDd:         4.80 cm      Diastology LVIDs:         2.80 cm      LV e' medial:    8.49 cm/s LV PW:         1.10 cm      LV E/e' medial:  13.2 LV IVS:        0.90 cm      LV e' lateral:   11.10 cm/s LVOT diam:     2.10 cm      LV E/e' lateral: 10.1 LV SV:         97 LV SV Index:   45 LVOT Area:     3.46 cm  LV Volumes (MOD) LV vol d, MOD A2C: 141.0 ml LV vol d, MOD A4C: 144.0 ml LV vol s, MOD A2C: 51.5 ml LV vol s, MOD A4C: 51.5 ml LV SV MOD A2C:     89.5 ml LV SV MOD A4C:     144.0 ml LV SV MOD BP:      91.9 ml RIGHT VENTRICLE             IVC RV Basal diam:  4.10 cm     IVC diam: 2.90 cm RV Mid diam:    3.10 cm RV S prime:     10.80 cm/s TAPSE (M-mode): 1.9 cm LEFT ATRIUM             Index        RIGHT ATRIUM           Index LA diam:        4.40 cm 2.03 cm/m   RA Area:     23.40 cm LA Vol (A2C):   61.2 ml 28.23 ml/m  RA Volume:   77.70 ml  35.84 ml/m LA Vol (A4C):   58.1 ml 26.80 ml/m LA Biplane Vol: 62.1 ml 28.65 ml/m  AORTIC VALVE AV Area (Vmax): 3.48 cm AV Vmax:        174.00 cm/s AV  Peak Grad:   12.1 mmHg LVOT Vmax:      175.00 cm/s LVOT Vmean:     114.000 cm/s LVOT VTI:       0.280 m  AORTA Ao Root diam: 2.40 cm Ao Asc diam:  2.50 cm MITRAL VALVE                TRICUSPID VALVE MV Area (PHT): 5.06 cm     TR Peak grad:    25.4 mmHg MV Decel Time: 150 msec     TR Vmax:        252.00 cm/s MV E velocity: 112.00 cm/s MV A velocity: 61.60 cm/s   SHUNTS MV E/A ratio:  1.82         Systemic VTI:  0.28 m                             Systemic Diam: 2.10 cm Epifanio Lesches MD Electronically signed by Epifanio Lesches MD Signature Date/Time: 03/10/2021/10:15:52 AM    Final    US LIVER DOPPLER  Result Date: 03/10/2021 CLINICAL DATA:  Acute liver failure EXAM: DUPLEX ULTRASOUND OF LIVER TECHNIQUE: Color and duplex Doppler ultrasound was performed to evaluate the hepatic in-flow and out-flow vessels. COMPARISON:  03/09/2021 FINDINGS: Liver: Mildly heterogeneous liver. Normal hepatic contour without nodularity. No focal lesion, mass or intrahepatic biliary ductal dilatation. Main Portal Vein size: 0.9 cm Portal Vein Velocities Main Prox:  20 cm/sec Main Mid: 14 cm/sec Main Dist:  19 cm/sec Right: 13 cm/sec Left: 16 cm/sec Hepatic Vein Velocities Right:  44 cm/sec Middle:  62 cm/sec Left:  72 cm/sec IVC: Present and patent with normal respiratory phasicity. Hepatic Artery Velocity:  218 cm/sec Splenic Vein Velocity:  23 cm/sec Spleen: 13 cm x 3.6 cm x 4.9 cm with a total volume of 121 cm^3 (411 cm^3 is upper limit normal) Portal Vein Occlusion/Thrombus: No Splenic Vein Occlusion/Thrombus: No Ascites: None Varices: None Negative for portal vein occlusion or thrombus. Limited exam because of patient's condition. IMPRESSION: Patent portal, hepatic and splenic veins with normal directional flow. Electronically Signed   By: Judie Petit.  Shick M.D.   On: 03/10/2021 07:35    Medications: Scheduled:  Chlorhexidine Gluconate Cloth  6 each Topical Daily   folic acid  1 mg Oral Daily   mouth rinse  15 mL Mouth Rinse BID   sodium chloride flush  10-40 mL Intracatheter Q12H   thiamine  100 mg Oral Daily   Continuous:   prismasol BGK 4/2.5 500 mL/hr at 03/11/21 0203    prismasol BGK 4/2.5 300 mL/hr at 03/11/21 0553   acetylcysteine 6.25 mg/kg/hr  (03/11/21 0800)   cefTRIAXone (ROCEPHIN)  IV Stopped (03/10/21 1008)   heparin 10,000 units/ 20 mL infusion syringe 750 Units/hr (03/11/21 0205)   norepinephrine (LEVOPHED) Adult infusion Stopped (03/10/21 2109)   prismasol BGK 4/2.5 1,500 mL/hr at 03/11/21 4081   sodium phosphate  Dextrose 5% IVPB 43 mL/hr at 03/11/21 0800    Assessment/Plan: 1) Acute liver failure felt to be secondary to DILI - stable.  Some signs of improvement. 2) AKI - resolved/improved with CRRT. 3) SIRS.   The patient's INR and TB both improved.  There is some elevation in his liver enzymes.  There was no evidence of asterixis to suggest hepatic encephalopathy and he is alert and orient.  His liver enzymes may no have reached the peak, but synthetic function appears to be improving.  Plan: 1) Continue with  supportive care. 2) Continue with ceftriaxone.  LOS: 2 days   Ranard Harte D 03/11/2021, 8:36 AM

## 2021-03-11 NOTE — Progress Notes (Addendum)
[facility] NEPHROLOGY PROGRESS NOTE  Assessment/ Plan: Pt is a 30 y.o. yo male with history of gout, pyelonephritis, nephrolithiasis presents to the ER with nausea vomiting, weakness, anasarca in the setting of acute liver failure, AKI, severe acidosis and hyperkalemia.  #Severe hyperkalemia on admission: Improved with CRRT.  Changing potassium bath today to all 4K.  Monitor lab.  #Acute liver injury presumably due to allopurinol but exact etiology unknown.  GI is following.  Trend liver enzyme.  Patient also with coagulopathy.  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  Checking urine electrolytes.  Currently requiring CRRT.  He has anasarca/significant fluid overload therefore plan to continue CRRT today and attempt ultrafiltration.  Hopefully we can take him off of CRRT by tomorrow.  #Anion gap metabolic acidosis/lactic acidosis: Improving with CRRT.  #Hypophosphatemia: Replete IV phosphorus.  Monitor lab.  #Pancytopenia: With acute liver failure.  Monitor labs and evaluation ongoing per primary team.  Discussed with ICU nurse.  Subjective: Seen and examined.  Chart reviewed.  Lab parameters improved therefore adjusted CRRT prescription.  He is alert awake and following commands.  Urine output noted to be around 990 cc in 24 hours. Objective Vital signs in last 24 hours: Vitals:   03/11/21 0530 03/11/21 0600 03/11/21 0630 03/11/21 0700  BP: 135/64 137/69 129/60 131/60  Pulse: (!) 110 (!) 113 (!) 112 (!) 111  Resp: 20 (!) 33 (!) 31 (!) 28  Temp:      TempSrc:      SpO2: 96% 98% 96% 95%  Weight:      Height:       Weight change: -0.2 kg  Intake/Output Summary (Last 24 hours) at 03/11/2021 0707 Last data filed at 03/11/2021 0700 Gross per 24 hour  Intake 2909.08 ml  Output 3676 ml  Net -766.92 ml       Labs: Basic Metabolic Panel: Recent Labs  Lab 03/10/21 0610 03/10/21 1252 03/10/21 1701 03/10/21 2000  03/11/21 0305  NA 129*   < > 133* 133* 135  K 6.2*   < > 4.0 3.5 3.4*  CL 95*   < > 94* 94* 98  CO2 <7*   < > 20* 23 27  GLUCOSE 140*   < > 121* 98 95  BUN 31*   < > 27* 23* 18  CREATININE 2.41*   < > 1.74* 1.55* 1.05  CALCIUM 8.1*   < > 8.2* 8.3* 8.0*  PHOS 5.6*  --  2.8  --  2.2*   < > = values in this interval not displayed.   Liver Function Tests: Recent Labs  Lab 03/10/21 0133 03/10/21 0610 03/10/21 1701 03/11/21 0305  AST 1,560* 1,713*  --  1,754*  ALT 932* 1,003*  --  1,269*  ALKPHOS 65 54  --  58  BILITOT 6.0* 5.6*  --  4.7*  PROT 6.1* 5.5*  --  4.7*  ALBUMIN 3.4* 3.0* 2.9* 2.6*   Recent Labs  Lab 03/09/21 1643  LIPASE 37   Recent Labs  Lab 03/09/21 1643  AMMONIA 64*   CBC: Recent Labs  Lab 03/09/21 1643 03/09/21 1804 03/10/21 0610 03/10/21 1701 03/11/21 0305  WBC 7.3  --  6.3 4.2 2.5*  NEUTROABS 3.9  --  4.3  --   --   HGB 11.9*   < > 9.9* 10.0* 9.9*  HCT 37.4*   < > 32.5* 29.9* 29.7*  MCV 117.2*  --  121.3* 110.7* 110.0*  PLT  294  --  174 112* 81*   < > = values in this interval not displayed.   Cardiac Enzymes: Recent Labs  Lab 03/09/21 1643  CKTOTAL 219   CBG: Recent Labs  Lab 03/10/21 1142 03/10/21 1515 03/10/21 1917 03/10/21 2321 03/11/21 0309  GLUCAP 149* 135* 107* 96 95    Iron Studies: No results for input(s): IRON, TIBC, TRANSFERRIN, FERRITIN in the last 72 hours. Studies/Results: CT ABDOMEN PELVIS W CONTRAST  Result Date: 03/09/2021 CLINICAL DATA:  Mid abdominal pain EXAM: CT ABDOMEN AND PELVIS WITH CONTRAST TECHNIQUE: Multidetector CT imaging of the abdomen and pelvis was performed using the standard protocol following bolus administration of intravenous contrast. CONTRAST:  67mL OMNIPAQUE IOHEXOL 350 MG/ML SOLN COMPARISON:  CT from 09/17/2019, ultrasound from 09/18/2019 FINDINGS: Lower chest: No acute abnormality. Hepatobiliary: Fatty infiltration of the liver is noted. Gallbladder is well distended without discrete wall  thickening or pericholecystic fluid. Pancreas: Unremarkable. No pancreatic ductal dilatation or surrounding inflammatory changes. Spleen: Normal in size without focal abnormality. Adrenals/Urinary Tract: Adrenal glands are within normal limits. Right kidney is well visualize without renal calculi. Normal enhancement pattern is seen. No obstructive changes are noted. The bladder is partially distended. The left kidney demonstrates some patchy enhancement compared to that on the right which may represent some early changes of pyelonephritis. Correlate with laboratory values. No obstructive changes are seen. Stomach/Bowel: Colon shows no obstructive or inflammatory changes. The appendix has been surgically removed. The small bowel and stomach are within normal limits. Vascular/Lymphatic: No significant vascular findings are present. No enlarged abdominal or pelvic lymph nodes. Reproductive: Prostate is unremarkable. Other: Mild free fluid is noted within the pelvis and surrounding the liver new from the prior exam. Mild changes of anasarca are noted as well. Musculoskeletal: No acute or significant osseous findings. IMPRESSION: Patchy enhancement within the left kidney suspicious for pyelonephritis. Correlate with laboratory values. Mild free fluid within the abdomen and pelvis as this is of uncertain etiology. Mild changes of anasarca are noted as well. Fatty infiltration of the liver. Electronically Signed   By: Alcide Clever M.D.   On: 03/09/2021 18:39   DG Chest Port 1 View  Result Date: 03/10/2021 CLINICAL DATA:  Central line placement. EXAM: PORTABLE CHEST 1 VIEW COMPARISON:  March 09, 2021 (5:43 p.m.) FINDINGS: Interval left internal jugular venous catheter placement is noted with its distal tip seen at the junction of the superior vena cava and right atrium. Decreased lung volumes are seen without evidence of acute infiltrate, pleural effusion or pneumothorax. The heart size and mediastinal contours are  within normal limits. The visualized skeletal structures are unremarkable. IMPRESSION: Interval left internal jugular venous catheter placement positioning, as described above. Electronically Signed   By: Aram Candela M.D.   On: 03/10/2021 04:33   DG Chest Portable 1 View  Result Date: 03/09/2021 CLINICAL DATA:  Leg swelling and fever.  Dehydration. EXAM: PORTABLE CHEST 1 VIEW COMPARISON:  09/17/2019 FINDINGS: Numerous leads and wires project over the chest. Apical lordotic positioning. Midline trachea. Mild cardiomegaly. No pleural effusion or pneumothorax. No congestive failure. Clear lungs. IMPRESSION: Mild cardiomegaly, without congestive failure or acute disease. Electronically Signed   By: Jeronimo Greaves M.D.   On: 03/09/2021 18:08   ECHOCARDIOGRAM COMPLETE  Result Date: 03/10/2021    ECHOCARDIOGRAM REPORT   Patient Name:   BARTON WANT Weissinger Date of Exam: 03/10/2021 Medical Rec #:  [id number]      Height:       68.0 in  Accession #:    [id number]     Weight:       229.7 lb Date of Birth:  1990-05-16      BSA:          2.168 m Patient Age:    30 years       BP:           110/38 mmHg Patient Gender: M              HR:           100 bpm. Exam Location:  Inpatient Procedure: 2D Echo, Cardiac Doppler and Color Doppler STAT ECHO Indications:    Cardiomegaly  History:        Patient has no prior history of Echocardiogram examinations.  Sonographer:    Cleatis Polka Referring Phys: [id number] ERIK C HOFFMAN IMPRESSIONS  1. Left ventricular ejection fraction, by estimation, is 60 to 65%. The left ventricle has normal function. The left ventricle has no regional wall motion abnormalities. Left ventricular diastolic parameters were normal.  2. Right ventricular systolic function is normal. The right ventricular size is mildly enlarged. There is mildly elevated pulmonary artery systolic pressure. The estimated right ventricular systolic pressure is 40.4 mmHg.  3. Right atrial size was mildly dilated.  4. The mitral valve is  normal in structure. Trivial mitral valve regurgitation.  5. Tricuspid valve regurgitation is mild to moderate.  6. The aortic valve was not well visualized. Aortic valve regurgitation is not visualized.  7. The inferior vena cava is dilated in size with <50% respiratory variability, suggesting right atrial pressure of 15 mmHg. FINDINGS  Left Ventricle: Left ventricular ejection fraction, by estimation, is 60 to 65%. The left ventricle has normal function. The left ventricle has no regional wall motion abnormalities. The left ventricular internal cavity size was normal in size. There is  no left ventricular hypertrophy. Left ventricular diastolic parameters were normal. Right Ventricle: The right ventricular size is mildly enlarged. No increase in right ventricular wall thickness. Right ventricular systolic function is normal. There is mildly elevated pulmonary artery systolic pressure. The tricuspid regurgitant velocity is 2.52 m/s, and with an assumed right atrial pressure of 15 mmHg, the estimated right ventricular systolic pressure is 40.4 mmHg. Left Atrium: Left atrial size was normal in size. Right Atrium: Right atrial size was mildly dilated. Pericardium: Trivial pericardial effusion is present. Mitral Valve: The mitral valve is normal in structure. Trivial mitral valve regurgitation. Tricuspid Valve: The tricuspid valve is normal in structure. Tricuspid valve regurgitation is mild to moderate. Aortic Valve: The aortic valve was not well visualized. Aortic valve regurgitation is not visualized. Aortic valve peak gradient measures 12.1 mmHg. Pulmonic Valve: The pulmonic valve was not well visualized. Pulmonic valve regurgitation is not visualized. Aorta: The aortic root and ascending aorta are structurally normal, with no evidence of dilitation. Venous: The inferior vena cava is dilated in size with less than 50% respiratory variability, suggesting right atrial pressure of 15 mmHg. IAS/Shunts: The interatrial  septum was not well visualized.  LEFT VENTRICLE PLAX 2D LVIDd:         4.80 cm      Diastology LVIDs:         2.80 cm      LV e' medial:    8.49 cm/s LV PW:         1.10 cm      LV E/e' medial:  13.2 LV IVS:        0.90 cm  LV e' lateral:   11.10 cm/s LVOT diam:     2.10 cm      LV E/e' lateral: 10.1 LV SV:         97 LV SV Index:   45 LVOT Area:     3.46 cm  LV Volumes (MOD) LV vol d, MOD A2C: 141.0 ml LV vol d, MOD A4C: 144.0 ml LV vol s, MOD A2C: 51.5 ml LV vol s, MOD A4C: 51.5 ml LV SV MOD A2C:     89.5 ml LV SV MOD A4C:     144.0 ml LV SV MOD BP:      91.9 ml RIGHT VENTRICLE             IVC RV Basal diam:  4.10 cm     IVC diam: 2.90 cm RV Mid diam:    3.10 cm RV S prime:     10.80 cm/s TAPSE (M-mode): 1.9 cm LEFT ATRIUM             Index        RIGHT ATRIUM           Index LA diam:        4.40 cm 2.03 cm/m   RA Area:     23.40 cm LA Vol (A2C):   61.2 ml 28.23 ml/m  RA Volume:   77.70 ml  35.84 ml/m LA Vol (A4C):   58.1 ml 26.80 ml/m LA Biplane Vol: 62.1 ml 28.65 ml/m  AORTIC VALVE AV Area (Vmax): 3.48 cm AV Vmax:        174.00 cm/s AV Peak Grad:   12.1 mmHg LVOT Vmax:      175.00 cm/s LVOT Vmean:     114.000 cm/s LVOT VTI:       0.280 m  AORTA Ao Root diam: 2.40 cm Ao Asc diam:  2.50 cm MITRAL VALVE                TRICUSPID VALVE MV Area (PHT): 5.06 cm     TR Peak grad:   25.4 mmHg MV Decel Time: 150 msec     TR Vmax:        252.00 cm/s MV E velocity: 112.00 cm/s MV A velocity: 61.60 cm/s   SHUNTS MV E/A ratio:  1.82         Systemic VTI:  0.28 m                             Systemic Diam: 2.10 cm Epifanio Lesches MD Electronically signed by Epifanio Lesches MD Signature Date/Time: 03/10/2021/10:15:52 AM    Final    US LIVER DOPPLER  Result Date: 03/10/2021 CLINICAL DATA:  Acute liver failure EXAM: DUPLEX ULTRASOUND OF LIVER TECHNIQUE: Color and duplex Doppler ultrasound was performed to evaluate the hepatic in-flow and out-flow vessels. COMPARISON:  03/09/2021 FINDINGS: Liver: Mildly  heterogeneous liver. Normal hepatic contour without nodularity. No focal lesion, mass or intrahepatic biliary ductal dilatation. Main Portal Vein size: 0.9 cm Portal Vein Velocities Main Prox:  20 cm/sec Main Mid: 14 cm/sec Main Dist:  19 cm/sec Right: 13 cm/sec Left: 16 cm/sec Hepatic Vein Velocities Right:  44 cm/sec Middle:  62 cm/sec Left:  72 cm/sec IVC: Present and patent with normal respiratory phasicity. Hepatic Artery Velocity:  218 cm/sec Splenic Vein Velocity:  23 cm/sec Spleen: 13 cm x 3.6 cm x 4.9 cm with a total volume of 121 cm^3 (411 cm^3 is upper  limit normal) Portal Vein Occlusion/Thrombus: No Splenic Vein Occlusion/Thrombus: No Ascites: None Varices: None Negative for portal vein occlusion or thrombus. Limited exam because of patient's condition. IMPRESSION: Patent portal, hepatic and splenic veins with normal directional flow. Electronically Signed   By: Judie Petit.  Shick M.D.   On: 03/10/2021 07:35    Medications: Infusions:   prismasol BGK 4/2.5 500 mL/hr at 03/11/21 0203    prismasol BGK 4/2.5 300 mL/hr at 03/11/21 0553   acetylcysteine 6.25 mg/kg/hr (03/11/21 0700)   cefTRIAXone (ROCEPHIN)  IV Stopped (03/10/21 1008)   heparin 10,000 units/ 20 mL infusion syringe 750 Units/hr (03/11/21 0205)   norepinephrine (LEVOPHED) Adult infusion Stopped (03/10/21 2109)   prismasol BGK 4/2.5 1,500 mL/hr at 03/11/21 0500    Scheduled Medications:  Chlorhexidine Gluconate Cloth  6 each Topical Daily   folic acid  1 mg Oral Daily   mouth rinse  15 mL Mouth Rinse BID   sodium chloride flush  10-40 mL Intracatheter Q12H   thiamine  100 mg Oral Daily    have reviewed scheduled and prn medications.  Physical Exam: General:NAD, comfortable, icterus present Heart:RRR, s1s2 nl Lungs: Clear anteriorly, no crackle Abdomen:soft, Non-tender, non-distended Extremities: Bilateral lower extremities pitting edema present Dialysis Access: Temporary IJ HD catheter  Makenzye Troutman Jaynie Collins 03/11/2021,7:07  AM  LOS: 2 days

## 2021-03-11 NOTE — Progress Notes (Signed)
NAME:  Curtis Erickson, MRN:  [id number], DOB:  May 06, 1990, LOS: 2 ADMISSION DATE:  03/09/2021, CONSULTATION DATE:  03/09/21 REFERRING MD:  Mikey Bussing, CHIEF COMPLAINT:  Liver injury   History of Present Illness:  Curtis Erickson is a 30 y.o. male who has no significant PMH besides gout.  He presented to [facility] ED 12/1 with weakness, fatigue, LE edema, nausea, vomiting for roughly 1 week.  Also noticed that his respiratory rate had increased for past several days. Last BM 1 day before presentation, diarrhea like consistency. He apparently started to feel very dehydrated and had no appetite for food or drink so he went to PCP 12/1 where he had abnormal labs including concern for acute liver failure and was subsequently sent to ED.  He was started on Allopurinol roughly 1 month ago for gout flare. This was stopped 12/1. Denies any drug use.  Does drink alcohol, normally wine coolers but last drink was over 1 week ago.  No additional medications.  No exposures to known sick contacts.    Pertinent  Medical History  Gout, acute pyelonephritis, B12 deficiency, folate deficiency, iron deficiency, ETOH abuse, alcoholic fatty liver disease, acute hepatitis,   Significant Hospital Events: Including procedures, antibiotic start and stop dates in addition to other pertinent events   12/1 admit 12/2 CRRT  Interim History / Subjective:  Tolerating CRRT. Reports feeling better. Less swelling in legs and body Off pressors  Objective   Blood pressure 129/62, pulse (!) 111, temperature 98.7 F (37.1 C), temperature source Axillary, resp. rate 11, height 5\' 8"  (1.727 m), weight 104 kg, SpO2 96 %.        Intake/Output Summary (Last 24 hours) at 03/11/2021 0818 Last data filed at 03/11/2021 0800 Gross per 24 hour  Intake 2712.46 ml  Output 3909 ml  Net -1196.54 ml   Filed Weights   03/10/21 0531 03/11/21 0234  Weight: 104.2 kg 104 kg   Physical Exam: General: Well-appearing, no acute distress HENT: [address], AT, OP  clear, MMM Eyes: EOMI, no scleral icterus Respiratory: Clear to auscultation bilaterally.  No crackles, wheezing or rales Cardiovascular: RRR, -M/R/G, no JVD GI: BS+, soft, nontender Extremities:Mild anasarca,-tenderness Neuro: AAO x4, CNII-XII grossly intact  Resolved Hospital Problem list   N/A  Assessment & Plan:  Acute liver failure/Drug induced liver injury likely 2/2 Allopurinol toxicity       Elevated INR  PT/INR elevated to 32.2/3.1. Acute hepatitis panel, tylenol, salicylate, ethanol neg MELD score 36, estimated 3 month mortality is 52.6%.   -Stable/slightly elevated LFTs  - Appreciate GI recs - Labs pending: Ceruloplasmin, alpha-1-antitrypsin, ANA, EBV, HSV CMV  - Trend PT/INR/LFTs daily - S/p vitamin K 12/2 - NAC - Liver biopsy when patient is more stable - Consider transfer to [facility] if mental status worsens  AKI with metabolic acidosis and hyperkalemia - Appreciate Nephrology recs - Continue CRRT - DC sodium bicarb gtt 12/2 - Monitor UOP/Cr  Septic shock with concern for pyelonephritis: UA negative for leukocytes or nitrites. Patient endorsing some flank pain + chills. Foley catheter in place.  - Off levophed - Continue rocephin  - Blood and urine cultures pending - Trend lactic acid q8h  5. Alcohol use:  -Thiamine and folic acid. No concern for withdrawal at this time, last drink was ~7 days ago.   6. Anemia: Hb dropped from 12.6 to 9.9, could be secondary to volume of IVF resuscitation. No signs of active bleeding on exam - Trend CBC twice daily  Best Practice (right click and "Reselect all SmartList Selections" daily)   Diet/type: Regular consistency (see orders) DVT prophylaxis: otherhold  GI prophylaxis: N/A Lines: Dialysis Catheter Foley:  Yes, and it is still needed Code Status:  full code Last date of multidisciplinary goals of care discussion [N/A] Dispo: Remain in ICU  Updated family on 12/2 and patient at bedside 12/3 Critical care time:      The patient is critically ill with multiple organ systems failure and requires high complexity decision making for assessment and support, frequent evaluation and titration of therapies, application of advanced monitoring technologies and extensive interpretation of multiple databases.  Independent Critical Care Time: 45 Minutes.   Mechele Collin, M.D. [facility] Pulmonary/Critical Care Medicine 03/11/2021 8:18 AM   Please see Amion for pager number to reach on-call Pulmonary and Critical Care Team.

## 2021-03-12 LAB — RENAL FUNCTION PANEL
Albumin: 2.5 g/dL — ABNORMAL LOW (ref 3.5–5.0)
Albumin: 2.8 g/dL — ABNORMAL LOW (ref 3.5–5.0)
Anion gap: 8 (ref 5–15)
Anion gap: 9 (ref 5–15)
BUN: <5 mg/dL — ABNORMAL LOW (ref 6–20)
BUN: 6 mg/dL (ref 6–20)
CO2: 25 mmol/L (ref 22–32)
CO2: 23 mmol/L (ref 22–32)
Calcium: 8 mg/dL — ABNORMAL LOW (ref 8.9–10.3)
Calcium: 8.7 mg/dL — ABNORMAL LOW (ref 8.9–10.3)
Chloride: 105 mmol/L (ref 98–111)
Chloride: 102 mmol/L (ref 98–111)
Creatinine, Ser: 0.55 mg/dL — ABNORMAL LOW (ref 0.61–1.24)
Creatinine, Ser: 0.62 mg/dL (ref 0.61–1.24)
GFR, Estimated: >60 mL/min (ref >60)
GFR, Estimated: >60 mL/min (ref >60)
Glucose, Bld: 122 mg/dL — ABNORMAL HIGH (ref 70–99)
Glucose, Bld: 112 mg/dL — ABNORMAL HIGH (ref 70–99)
Phosphorus: 2.1 mg/dL — ABNORMAL LOW (ref 2.5–4.6)
Phosphorus: 3.2 mg/dL (ref 2.5–4.6)
Potassium: 3.3 mmol/L — ABNORMAL LOW (ref 3.5–5.1)
Potassium: 4 mmol/L (ref 3.5–5.1)
Sodium: 138 mmol/L (ref 135–145)
Sodium: 134 mmol/L — ABNORMAL LOW (ref 135–145)

## 2021-03-12 LAB — LACTIC ACID, PLASMA
Lactic Acid, Venous: 2.8 mmol/L (ref 0.5–1.9)
Lactic Acid, Venous: 2.9 mmol/L (ref 0.5–1.9)

## 2021-03-12 LAB — BASIC METABOLIC PANEL
Anion gap: 7 (ref 5–15)
BUN: <5 mg/dL — ABNORMAL LOW (ref 6–20)
CO2: 26 mmol/L (ref 22–32)
Calcium: 8 mg/dL — ABNORMAL LOW (ref 8.9–10.3)
Chloride: 105 mmol/L (ref 98–111)
Creatinine, Ser: 0.58 mg/dL — ABNORMAL LOW (ref 0.61–1.24)
GFR, Estimated: >60 mL/min (ref >60)
Glucose, Bld: 139 mg/dL — ABNORMAL HIGH (ref 70–99)
Potassium: 3.6 mmol/L (ref 3.5–5.1)
Sodium: 138 mmol/L (ref 135–145)

## 2021-03-12 LAB — CBC
HCT: 30.6 % — ABNORMAL LOW (ref 39.0–52.0)
Hemoglobin: 10.3 g/dL — ABNORMAL LOW (ref 13.0–17.0)
MCH: 37.7 pg — ABNORMAL HIGH (ref 26.0–34.0)
MCHC: 33.7 g/dL (ref 30.0–36.0)
MCV: 112.1 fL — ABNORMAL HIGH (ref 80.0–100.0)
Platelets: 67 10*3/uL — ABNORMAL LOW (ref 150–400)
RBC: 2.73 MIL/uL — ABNORMAL LOW (ref 4.22–5.81)
RDW: 16.5 % — ABNORMAL HIGH (ref 11.5–15.5)
WBC: 1.6 10*3/uL — ABNORMAL LOW (ref 4.0–10.5)
nRBC: 1.8 % — ABNORMAL HIGH (ref 0.0–0.2)

## 2021-03-12 LAB — MAGNESIUM
Magnesium: 2 mg/dL (ref 1.7–2.4)
Magnesium: 2.1 mg/dL (ref 1.7–2.4)

## 2021-03-12 LAB — HEPATIC FUNCTION PANEL
ALT: 891 U/L — ABNORMAL HIGH (ref 0–44)
AST: 579 U/L — ABNORMAL HIGH (ref 15–41)
Albumin: 2.5 g/dL — ABNORMAL LOW (ref 3.5–5.0)
Alkaline Phosphatase: 68 U/L (ref 38–126)
Bilirubin, Direct: 1.2 mg/dL — ABNORMAL HIGH (ref 0.0–0.2)
Indirect Bilirubin: 1.6 mg/dL — ABNORMAL HIGH (ref 0.3–0.9)
Total Bilirubin: 2.8 mg/dL — ABNORMAL HIGH (ref 0.3–1.2)
Total Protein: 4.7 g/dL — ABNORMAL LOW (ref 6.5–8.1)

## 2021-03-12 LAB — GLUCOSE, CAPILLARY
Glucose-Capillary: 107 mg/dL — ABNORMAL HIGH (ref 70–99)
Glucose-Capillary: 131 mg/dL — ABNORMAL HIGH (ref 70–99)
Glucose-Capillary: 115 mg/dL — ABNORMAL HIGH (ref 70–99)

## 2021-03-12 LAB — PROTIME-INR
INR: 1.7 — ABNORMAL HIGH (ref 0.8–1.2)
Prothrombin Time: 19.5 seconds — ABNORMAL HIGH (ref 11.4–15.2)

## 2021-03-12 LAB — ANTI-SMOOTH MUSCLE ANTIBODY, IGG: F-Actin IgG: 7 Units (ref 0–19)

## 2021-03-12 LAB — PHOSPHORUS: Phosphorus: 2.3 mg/dL — ABNORMAL LOW (ref 2.5–4.6)

## 2021-03-12 MED ORDER — POTASSIUM PHOSPHATES 15 MMOLE/5ML IV SOLN
30.0000 mmol | Freq: Once | INTRAVENOUS | Status: AC
  Administered 2021-03-12: 11:00:00 30 mmol via INTRAVENOUS
  Filled 2021-03-12: qty 10

## 2021-03-12 MED ORDER — HYDRALAZINE HCL 20 MG/ML IJ SOLN
10.0000 mg | Freq: Four times a day (QID) | INTRAMUSCULAR | Status: DC | PRN
  Filled 2021-03-12: qty 1

## 2021-03-12 MED ORDER — HEPARIN SODIUM (PORCINE) 5000 UNIT/ML IJ SOLN
5000.0000 [IU] | Freq: Three times a day (TID) | INTRAMUSCULAR | Status: DC
  Administered 2021-03-12 – 2021-03-15 (×10): 5000 [IU] via SUBCUTANEOUS
  Filled 2021-03-12 (×10): qty 1

## 2021-03-12 NOTE — Progress Notes (Signed)
NAME:  Curtis Erickson, MRN:  [id number], DOB:  1990-10-06, LOS: 3 ADMISSION DATE:  03/09/2021, CONSULTATION DATE:  03/09/21 REFERRING MD:  Mikey Bussing, CHIEF COMPLAINT:  Liver injury   History of Present Illness:  Curtis Erickson is a 30 y.o. male who has no significant PMH besides gout.  He presented to [facility] ED 12/1 with weakness, fatigue, LE edema, nausea, vomiting for roughly 1 week.  Also noticed that his respiratory rate had increased for past several days. Last BM 1 day before presentation, diarrhea like consistency. He apparently started to feel very dehydrated and had no appetite for food or drink so he went to PCP 12/1 where he had abnormal labs including concern for acute liver failure and was subsequently sent to ED.  He was started on Allopurinol roughly 1 month ago for gout flare. This was stopped 12/1. Denies any drug use.  Does drink alcohol, normally wine coolers but last drink was over 1 week ago.  No additional medications.  No exposures to known sick contacts.    Pertinent  Medical History  Gout, acute pyelonephritis, B12 deficiency, folate deficiency, iron deficiency, ETOH abuse, alcoholic fatty liver disease, acute hepatitis,   Significant Hospital Events: Including procedures, antibiotic start and stop dates in addition to other pertinent events   12/1 admit 12/2 CRRT 12/4 DC CRRT  Interim History / Subjective:  Tolerating CRRT. Continues to have swollen extremities  Objective   Blood pressure (!) 155/84, pulse (!) 112, temperature 98.2 F (36.8 C), temperature source Oral, resp. rate (!) 30, height 5\' 8"  (1.727 m), weight 103.7 kg, SpO2 96 %.        Intake/Output Summary (Last 24 hours) at 03/12/2021 0938 Last data filed at 03/12/2021 0900 Gross per 24 hour  Intake 2794.24 ml  Output 7824 ml  Net -5029.76 ml   Filed Weights   03/10/21 0531 03/11/21 0234 03/12/21 0500  Weight: 104.2 kg 104 kg 103.7 kg   Physical Exam: General: Well-appearing, no acute  distress HENT: [place], AT, OP clear, MMM Eyes: EOMI, no scleral icterus Respiratory: Clear to auscultation bilaterally.  No crackles, wheezing or rales Cardiovascular: RRR, -M/R/G, no JVD GI: BS+, soft, nontender Extremities: Mild anasarca,-tenderness Neuro: AAO x4, CNII-XII grossly intact Skin: Intact, no rashes or bruising Psych: Normal mood, normal affect GU: Foley in place  K 3.3 BUN <5 Cr 0.55 Phos 2.1  AST 579  ALT 891 Tbili 2.8  LA 2.9  WBC 1.6 Hg 10.3 Plt 67  Imaging, labs and test noted above have been reviewed independently by me.  Resolved Hospital Problem list   N/A  Assessment & Plan:  Acute liver failure/Drug induced liver injury likely 2/2 Allopurinol toxicity       Elevated INR  Acute hepatitis panel, tylenol, salicylate, ethanol neg MELD score 36, estimated 3 month mortality is 52.6%.   -Improving elevated LFTs -INR 3.1, now normalized  - Appreciate GI recs - Labs pending: Ceruloplasmin, alpha-1-antitrypsin, ANA, EBV, HSV CMV  - Trend LFTs daily - S/p vitamin K 12/2 - DC NAC  AKI with metabolic acidosis and hyperkalemia - Appreciate Nephrology recs. DC CRRT today - DC sodium bicarb gtt 12/2 - Monitor UOP/Cr  Septic shock with concern for pyelonephritis: UA negative for leukocytes or nitrites. Patient endorsing some flank pain + chills. Foley catheter in place.  - Off levophed - Continue rocephin  - Blood and urine cultures pending - Trend LA  5. Alcohol use:  -Thiamine and folic acid. No concern for withdrawal  at this time, last drink was ~7 days ago.   6. Pancytopenia/Anemia: Hb dropped from 12.6 to 9.9, could be secondary to volume of IVF resuscitation. No signs of active bleeding on exam - Trend - Transfuse for Hg < 7 and Plt < 10  7. Hypokalemia, Hypophos - Replete  Best Practice (right click and "Reselect all SmartList Selections" daily)   Diet/type: Regular consistency (see orders) DVT prophylaxis: prophylactic heparin  GI  prophylaxis: N/A Lines: Dialysis Catheter Foley:  Yes, and it is still needed Code Status:  full code Last date of multidisciplinary goals of care discussion [N/A] Dispo: Remain in ICU  Updated patient at bedside 12/4  Critical care time:    The patient is critically ill with multiple organ systems failure and requires high complexity decision making for assessment and support, frequent evaluation and titration of therapies, application of advanced monitoring technologies and extensive interpretation of multiple databases.  Independent Critical Care Time: 38 Minutes.   Mechele Collin, M.D. [facility] Pulmonary/Critical Care Medicine 03/12/2021 9:38 AM   Please see Amion for pager number to reach on-call Pulmonary and Critical Care Team.

## 2021-03-12 NOTE — Progress Notes (Signed)
Dr. Arlean Hopping notified via Amion of pts K=3.3 and phos=2.1. No new orders at this time.

## 2021-03-12 NOTE — Progress Notes (Signed)
[facility] NEPHROLOGY PROGRESS NOTE  Assessment/ Plan: Pt is a 30 y.o. yo male with history of gout, pyelonephritis, nephrolithiasis presents to the ER with nausea vomiting, weakness, anasarca in the setting of acute liver failure, AKI, severe acidosis and hyperkalemia.  #Severe hyperkalemia on admission: Improved with CRRT.  Now becoming hypokalemic with renal recovery requiring potassium repletion.  #Acute liver injury presumably due to DILI (allopurinol )but exact etiology unknown.  GI is following.  Trend liver enzyme.  Patient also with coagulopathy.  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  Started CRRT for anasarca, severe acidosis and hyperkalemia.  He has urine output around 3.6 L and renal recovery.  I will stop CRRT today.  Recommend to continue HD catheter for next 1 or 2 days.  Continue with strict ins and out, daily lab monitoring.  #Anion gap metabolic acidosis/lactic acidosis: Improved with CRRT.  #Hypophosphatemia: I will replace potassium phosphate.  Monitor lab.  #Pancytopenia: With acute liver failure.  Monitor labs and evaluation ongoing per primary team.  Discussed with ICU nurse..  Subjective: Seen and examined.  Tolerating CRRT well.  He is alert awake.  Urine output recorded around 3.6 L in 24 hours.  The CRRT was able to ultrafiltrate as well.  He denies nausea vomiting, chest pain or shortness of breath.  Feels tired.  Objective Vital signs in last 24 hours: Vitals:   03/12/21 0730 03/12/21 0800 03/12/21 0830 03/12/21 0900  BP: (!) 156/92 (!) 158/88 (!) 159/91 (!) 155/84  Pulse: (!) 112 (!) 116 (!) 113 (!) 112  Resp: (!) 29 (!) 22 (!) 25 (!) 30  Temp:      TempSrc:      SpO2: 98% 97% 98% 96%  Weight:      Height:       Weight change: -0.3 kg  Intake/Output Summary (Last 24 hours) at 03/12/2021 0912 Last data filed at 03/12/2021 0900 Gross per 24 hour  Intake 2794.24 ml  Output 7574 ml  Net  -4779.76 ml        Labs: Basic Metabolic Panel: Recent Labs  Lab 03/11/21 1619 03/12/21 0002 03/12/21 0313  NA 136 138 138  K 3.2* 3.6 3.3*  CL 103 105 105  CO2 24 26 25   GLUCOSE 145* 139* 122*  BUN 8 <5* <5*  CREATININE 0.70 0.58* 0.55*  CALCIUM 8.2* 8.0* 8.0*  PHOS 1.5* 2.3* 2.1*    Liver Function Tests: Recent Labs  Lab 03/10/21 0610 03/10/21 1701 03/11/21 0305 03/11/21 1619 03/12/21 0313  AST 1,713*  --  1,754*  --  579*  ALT 1,003*  --  1,269*  --  891*  ALKPHOS 54  --  58  --  68  BILITOT 5.6*  --  4.7*  --  2.8*  PROT 5.5*  --  4.7*  --  4.7*  ALBUMIN 3.0*   < > 2.6* 2.6* 2.5*  2.5*   < > = values in this interval not displayed.    Recent Labs  Lab 03/09/21 1643  LIPASE 37    Recent Labs  Lab 03/09/21 1643  AMMONIA 64*    CBC: Recent Labs  Lab 03/09/21 1643 03/09/21 1804 03/10/21 0610 03/10/21 1701 03/11/21 0305 03/11/21 1619 03/12/21 0313  WBC 7.3  --  6.3 4.2 2.5* 2.0* 1.6*  NEUTROABS 3.9  --  4.3  --   --   --   --   HGB 11.9*   < > 9.9*  10.0* 9.9* 11.2* 10.3*  HCT 37.4*   < > 32.5* 29.9* 29.7* 33.6* 30.6*  MCV 117.2*  --  121.3* 110.7* 110.0* 112.8* 112.1*  PLT 294  --  174 112* 81* 79* 67*   < > = values in this interval not displayed.    Cardiac Enzymes: Recent Labs  Lab 03/09/21 1643  CKTOTAL 219    CBG: Recent Labs  Lab 03/11/21 1130 03/11/21 1912 03/11/21 2311 03/12/21 0317 03/12/21 0717  GLUCAP 88 148* 128* 107* 131*     Iron Studies: No results for input(s): IRON, TIBC, TRANSFERRIN, FERRITIN in the last 72 hours. Studies/Results: No results found.  Medications: Infusions:  acetylcysteine 6.25 mg/kg/hr (03/12/21 0900)   cefTRIAXone (ROCEPHIN)  IV Stopped (03/11/21 1058)   heparin 10,000 units/ 20 mL infusion syringe 1,000 Units/hr (03/12/21 0104)   potassium PHOSPHATE IVPB (in mmol)      Scheduled Medications:  Chlorhexidine Gluconate Cloth  6 each Topical Daily   folic acid  1 mg Oral Daily    mouth rinse  15 mL Mouth Rinse BID   sodium chloride flush  10-40 mL Intracatheter Q12H   thiamine  100 mg Oral Daily    have reviewed scheduled and prn medications.  Physical Exam: General: Lying in bed comfortable, not in distress, dry mucous membrane Heart:RRR, s1s2 nl Lungs: Clear anteriorly, no wheezing or crackle Abdomen:soft, Non-tender, non-distended Extremities: Bilateral lower extremities pitting edema mildly improved Dialysis Access: Temporary IJ HD catheter  Natina Wiginton Jaynie Collins 03/12/2021,9:12 AM  LOS: 3 days

## 2021-03-12 NOTE — Progress Notes (Signed)
Subjective: Knee pain.  Feeling better.  Objective: Vital signs in last 24 hours: Temp:  [98 F (36.7 C)-98.8 F (37.1 C)] 98.2 F (36.8 C) (12/04 0717) Pulse Rate:  [108-119] 112 (12/04 0900) Resp:  [14-44] 30 (12/04 0900) BP: (131-159)/(61-97) 155/84 (12/04 0900) SpO2:  [91 %-100 %] 96 % (12/04 0900) Weight:  [103.7 kg] 103.7 kg (12/04 0500) Last BM Date: 03/12/21  Intake/Output from previous day: 12/03 0701 - 12/04 0700 In: 2610.8 [P.O.:1320; I.V.:471.4; IV Piggyback:819.4] Out: 7704 [Urine:3670] Intake/Output this shift: Total I/O In: 269.3 [P.O.:230; I.V.:39.3] Out: 460 [Urine:200; Other:260]  General appearance: alert and no distress Resp: clear to auscultation bilaterally Cardio: regular rate and rhythm GI: soft, non-tender; bowel sounds normal; no masses,  no organomegaly Extremities: no asterixis, less edema in his hands  Lab Results: Recent Labs    03/11/21 0305 03/11/21 1619 03/12/21 0313  WBC 2.5* 2.0* 1.6*  HGB 9.9* 11.2* 10.3*  HCT 29.7* 33.6* 30.6*  PLT 81* 79* 67*   BMET Recent Labs    03/11/21 1619 03/12/21 0002 03/12/21 0313  NA 136 138 138  K 3.2* 3.6 3.3*  CL 103 105 105  CO2 24 26 25   GLUCOSE 145* 139* 122*  BUN 8 <5* <5*  CREATININE 0.70 0.58* 0.55*  CALCIUM 8.2* 8.0* 8.0*   LFT Recent Labs    03/12/21 0313  PROT 4.7*  ALBUMIN 2.5*  2.5*  AST 579*  ALT 891*  ALKPHOS 68  BILITOT 2.8*  BILIDIR 1.2*  IBILI 1.6*   PT/INR Recent Labs    03/11/21 1236 03/12/21 0002  LABPROT 22.2* 19.5*  INR 2.0* 1.7*   Hepatitis Panel Recent Labs    03/09/21 1809  HEPBSAG NON REACTIVE  HCVAB NON REACTIVE  HEPAIGM NON REACTIVE  HEPBIGM NON REACTIVE   C-Diff No results for input(s): CDIFFTOX in the last 72 hours. Fecal Lactopherrin No results for input(s): FECLLACTOFRN in the last 72 hours.  Studies/Results: No results found.  Medications: Scheduled:  Chlorhexidine Gluconate Cloth  6 each Topical Daily   folic acid  1 mg  Oral Daily   mouth rinse  15 mL Mouth Rinse BID   sodium chloride flush  10-40 mL Intracatheter Q12H   thiamine  100 mg Oral Daily   Continuous:  acetylcysteine 6.25 mg/kg/hr (03/12/21 0900)   cefTRIAXone (ROCEPHIN)  IV Stopped (03/11/21 1058)   heparin 10,000 units/ 20 mL infusion syringe 1,000 Units/hr (03/12/21 0104)   potassium PHOSPHATE IVPB (in mmol)      Assessment/Plan: 1) Acute liver failure presumed to be DILI from allopurinol. 2) AKI. 3) Pancytopenia - worsened with today's values.   From the hepatic standpoint the patient continues to improve.  His synthetic function is trending in the right direction as the INR and TB have declined.  The liver enzymes appear to have peaked yesterday as they are declining.  The anticipation is that his liver will recover.  Renal is planning on stopping CRRT today.  Plan: 1) No new recommendations.  Continue with supportive care. 2) [facility] will resume care in the AM.  LOS: 3 days   Lional Icenogle D 03/12/2021, 9:22 AM

## 2021-03-13 DIAGNOSIS — K719 Toxic liver disease, unspecified: Secondary | ICD-10-CM

## 2021-03-13 LAB — CBC
HCT: 32 % — ABNORMAL LOW (ref 39.0–52.0)
Hemoglobin: 10.6 g/dL — ABNORMAL LOW (ref 13.0–17.0)
MCH: 37.2 pg — ABNORMAL HIGH (ref 26.0–34.0)
MCHC: 33.1 g/dL (ref 30.0–36.0)
MCV: 112.3 fL — ABNORMAL HIGH (ref 80.0–100.0)
Platelets: 64 10*3/uL — ABNORMAL LOW (ref 150–400)
RBC: 2.85 MIL/uL — ABNORMAL LOW (ref 4.22–5.81)
RDW: 16.6 % — ABNORMAL HIGH (ref 11.5–15.5)
WBC: 2.1 10*3/uL — ABNORMAL LOW (ref 4.0–10.5)
nRBC: 1 % — ABNORMAL HIGH (ref 0.0–0.2)

## 2021-03-13 LAB — HSV DNA BY PCR (REFERENCE LAB)
HSV 1 DNA: NEGATIVE
HSV 2 DNA: NEGATIVE

## 2021-03-13 LAB — HEPATIC FUNCTION PANEL
ALT: 711 U/L — ABNORMAL HIGH (ref 0–44)
AST: 330 U/L — ABNORMAL HIGH (ref 15–41)
Albumin: 2.7 g/dL — ABNORMAL LOW (ref 3.5–5.0)
Alkaline Phosphatase: 91 U/L (ref 38–126)
Bilirubin, Direct: 1 mg/dL — ABNORMAL HIGH (ref 0.0–0.2)
Indirect Bilirubin: 1.3 mg/dL — ABNORMAL HIGH (ref 0.3–0.9)
Total Bilirubin: 2.3 mg/dL — ABNORMAL HIGH (ref 0.3–1.2)
Total Protein: 5.1 g/dL — ABNORMAL LOW (ref 6.5–8.1)

## 2021-03-13 LAB — RENAL FUNCTION PANEL
Albumin: 2.7 g/dL — ABNORMAL LOW (ref 3.5–5.0)
Albumin: 2.8 g/dL — ABNORMAL LOW (ref 3.5–5.0)
Anion gap: 7 (ref 5–15)
Anion gap: 8 (ref 5–15)
BUN: 6 mg/dL (ref 6–20)
BUN: 8 mg/dL (ref 6–20)
CO2: 23 mmol/L (ref 22–32)
CO2: 25 mmol/L (ref 22–32)
Calcium: 8.7 mg/dL — ABNORMAL LOW (ref 8.9–10.3)
Calcium: 9 mg/dL (ref 8.9–10.3)
Chloride: 104 mmol/L (ref 98–111)
Chloride: 102 mmol/L (ref 98–111)
Creatinine, Ser: 0.75 mg/dL (ref 0.61–1.24)
Creatinine, Ser: 0.69 mg/dL (ref 0.61–1.24)
GFR, Estimated: >60 mL/min (ref >60)
GFR, Estimated: >60 mL/min (ref >60)
Glucose, Bld: 128 mg/dL — ABNORMAL HIGH (ref 70–99)
Glucose, Bld: 90 mg/dL (ref 70–99)
Phosphorus: 3.3 mg/dL (ref 2.5–4.6)
Phosphorus: 3.7 mg/dL (ref 2.5–4.6)
Potassium: 4.3 mmol/L (ref 3.5–5.1)
Potassium: 4.6 mmol/L (ref 3.5–5.1)
Sodium: 134 mmol/L — ABNORMAL LOW (ref 135–145)
Sodium: 135 mmol/L (ref 135–145)

## 2021-03-13 LAB — LACTIC ACID, PLASMA: Lactic Acid, Venous: 2.3 mmol/L (ref 0.5–1.9)

## 2021-03-13 LAB — MAGNESIUM: Magnesium: 1.7 mg/dL (ref 1.7–2.4)

## 2021-03-13 MED ORDER — COLCHICINE 0.6 MG PO TABS
1.2000 mg | ORAL_TABLET | Freq: Once | ORAL | Status: AC
  Administered 2021-03-13: 1.2 mg via ORAL
  Filled 2021-03-13: qty 2

## 2021-03-13 MED ORDER — METOPROLOL TARTRATE 12.5 MG HALF TABLET
12.5000 mg | ORAL_TABLET | Freq: Once | ORAL | Status: AC
Start: 2021-03-13 — End: 2021-03-13
  Administered 2021-03-13: 12.5 mg via ORAL
  Filled 2021-03-13: qty 1

## 2021-03-13 MED ORDER — GUAIFENESIN 100 MG/5ML PO LIQD
5.0000 mL | Freq: Four times a day (QID) | ORAL | Status: DC | PRN
  Administered 2021-03-13: 5 mL via ORAL
  Filled 2021-03-13 (×2): qty 5

## 2021-03-13 MED ORDER — HYDROMORPHONE HCL 1 MG/ML IJ SOLN
0.5000 mg | Freq: Once | INTRAMUSCULAR | Status: AC | PRN
  Administered 2021-03-13: 0.5 mg via INTRAVENOUS
  Filled 2021-03-13: qty 0.5

## 2021-03-13 MED ORDER — PREDNISONE 20 MG PO TABS
40.0000 mg | ORAL_TABLET | Freq: Every day | ORAL | Status: DC
  Administered 2021-03-14 – 2021-03-15 (×2): 40 mg via ORAL
  Filled 2021-03-13 (×2): qty 2

## 2021-03-13 MED ORDER — FUROSEMIDE 10 MG/ML IJ SOLN
40.0000 mg | Freq: Once | INTRAMUSCULAR | Status: AC
  Administered 2021-03-13: 40 mg via INTRAVENOUS
  Filled 2021-03-13: qty 4

## 2021-03-13 MED ORDER — HYDROMORPHONE HCL 1 MG/ML IJ SOLN
0.5000 mg | Freq: Once | INTRAMUSCULAR | Status: AC
  Administered 2021-03-13: 0.5 mg via INTRAVENOUS
  Filled 2021-03-13: qty 0.5

## 2021-03-13 MED ORDER — COLCHICINE 0.6 MG PO TABS
0.6000 mg | ORAL_TABLET | Freq: Every day | ORAL | Status: DC
  Administered 2021-03-14 – 2021-03-15 (×2): 0.6 mg via ORAL
  Filled 2021-03-13 (×2): qty 1

## 2021-03-13 MED ORDER — COLCHICINE 0.6 MG PO TABS: 0.6000 mg | ORAL_TABLET | Freq: Every day | ORAL | Status: DC

## 2021-03-13 NOTE — Progress Notes (Addendum)
NAME:  Curtis Erickson, MRN:  [id number], DOB:  [birth year], LOS: 4 ADMISSION DATE:  03/09/2021, CONSULTATION DATE:  03/09/21 REFERRING MD:  Mikey Bussing, CHIEF COMPLAINT:  Drug induced liver injury   History of Present Illness:  Curtis Erickson is a 30 y.o. male who has no significant PMH besides gout.  He presented to [facility] ED 12/1 with weakness, fatigue, LE edema, nausea, vomiting for roughly 1 week.  Also noticed that his respiratory rate had increased for past several days. Last BM 1 day before presentation, diarrhea like consistency. He apparently started to feel very dehydrated and had no appetite for food or drink so he went to PCP 12/1 where he had abnormal labs including concern for acute liver failure and was subsequently sent to ED.  He was started on Allopurinol roughly 1 month ago for gout flare. This was stopped 12/1. Denies any drug use.  Does drink alcohol, normally wine coolers but last drink was over 1 week ago.  No additional medications.  No exposures to known sick contacts.      Pertinent  Medical History  Gout, acute pyelonephritis, B12/folate deficiency, iron deficiency, ETOH abuse, alcoholic fatty liver disease, acute hepatitis  Significant Hospital Events: Including procedures, antibiotic start and stop dates in addition to other pertinent events   12/1 admit 12/2 CRRT 12/4 DC CRRT  Interim History / Subjective:  No acute events overnight.   Objective   Blood pressure (!) 147/84, pulse (!) 111, temperature 99.9 F (37.7 C), temperature source Oral, resp. rate (!) 31, height 5\' 8"  (1.727 m), weight 103.9 kg, SpO2 93 %.        Intake/Output Summary (Last 24 hours) at 03/13/2021 0711 Last data filed at 03/13/2021 0550 Gross per 24 hour  Intake 1820.1 ml  Output 4635 ml  Net -2814.9 ml   Filed Weights   03/11/21 0234 03/12/21 0500 03/13/21 0315  Weight: 104 kg 103.7 kg 103.9 kg    Examination: General: No acute distress HENT: Remains on 2 L [place] Lungs: CTAB. No  crackles or wheezing Cardiovascular: RRR. No murmurs, no JVD Abdomen: soft, nontender, bowel sounds appreciated Extremities: Mild anasarca. R Knee tender to palpation, erythematous  Neuro: A&Ox4, no focal deficits GU: Foley remains in place  Resolved Hospital Problem list   Hyperkalemia Lactic acidemia  Assessment & Plan:  Acute liver failure/drug induced acute liver injury 2/2 allopurinol toxicity MELD score 36 on admission with estimated 3 mo mortality at 52.6%- MELD score improved to 18 now. Acute hep panel, tylenol, salicylate, and ethanol negative. - GI consulted, appreciate recs - LFTs continue to improve - INR improved from 3 to 1.7 - Stable for transfer out of ICU  Acute renal failure with metabolic/lactic acidosis with compensatory respiratory alkalosis Patient with 4.4 L UOP in the last 24 hrs, net -3.6 L this admission.  - Nephrology consulted, appreciate recs - Remains off CRRT as of 12/4, sodium bicarb gtt d/c 12/2 - Renal function continues to improve - Remove HD cath - Lasix 40 mg x 1 dose - Continue to monitor UOP - D/c foley when edema improves   Septic shock with concern for pyelonephritis UA negative for leuks/nitrites, although patient previously endorsing flank pain + chills. Foley catheter remains in place. Urine culture with no growth and blood cultures with no growth at 2 days.  - Remains off levophed - Continue rocephin - Continue to follow blood cultures - Continue to trend lactic acid   Pancytopenia WBC 2.1, Hb 10.6, Plt  64. No signs of active bleeding on exam. Could be secondary to IVF.   Alcohol use No concern for withdrawal at this time, last drink >7 days ago. - Continue thiamine and folic acid  Gout flare Endorses significant pain in R knee, consistent with previous gout flares.  - Continue ice pack prn  - Start prednisone 40 mg daily x 2-5 days until sxs resolve - Colchicine 1.2 mg today, followed by 0.6 mg daily thereafter   Best  Practice (right click and "Reselect all SmartList Selections" daily)   Diet/type: Regular consistency (see orders) DVT prophylaxis: prophylactic heparin  GI prophylaxis: N/A Lines: Dialysis Catheter Foley:  Yes, and it is still needed Code Status:  full code Last date of multidisciplinary goals of care discussion [Patient updated 12/5]  Labs   CBC: Recent Labs  Lab 03/09/21 1643 03/09/21 1804 03/10/21 0610 03/10/21 1701 03/11/21 0305 03/11/21 1619 03/12/21 0313 03/13/21 0306  WBC 7.3  --  6.3 4.2 2.5* 2.0* 1.6* 2.1*  NEUTROABS 3.9  --  4.3  --   --   --   --   --   HGB 11.9*   < > 9.9* 10.0* 9.9* 11.2* 10.3* 10.6*  HCT 37.4*   < > 32.5* 29.9* 29.7* 33.6* 30.6* 32.0*  MCV 117.2*  --  121.3* 110.7* 110.0* 112.8* 112.1* 112.3*  PLT 294  --  174 112* 81* 79* 67* 64*   < > = values in this interval not displayed.    Basic Metabolic Panel: Recent Labs  Lab 03/10/21 0610 03/10/21 1252 03/11/21 0305 03/11/21 1236 03/11/21 1619 03/12/21 0002 03/12/21 0313 03/12/21 1642 03/13/21 0306  NA 129*   < > 135   < > 136 138 138 134* 134*  K 6.2*   < > 3.4*   < > 3.2* 3.6 3.3* 4.0 4.3  CL 95*   < > 98   < > 103 105 105 102 104  CO2 <7*   < > 27   < > 24 26 25 23 23   GLUCOSE 140*   < > 95   < > 145* 139* 122* 112* 128*  BUN 31*   < > 18   < > 8 <5* <5* 6 6  CREATININE 2.41*   < > 1.05   < > 0.70 0.58* 0.55* 0.62 0.75  CALCIUM 8.1*   < > 8.0*   < > 8.2* 8.0* 8.0* 8.7* 8.7*  MG 1.9  --  2.0  --   --  2.1 2.0  --  1.7  PHOS 5.6*   < > 2.2*  --  1.5* 2.3* 2.1* 3.2 3.3   < > = values in this interval not displayed.   GFR: Estimated Creatinine Clearance: 159.2 mL/min (by C-G formula based on SCr of 0.75 mg/dL). Recent Labs  Lab 03/11/21 0305 03/11/21 1236 03/11/21 1619 03/12/21 0002 03/12/21 0313 03/12/21 1642 03/13/21 0306  WBC 2.5*  --  2.0*  --  1.6*  --  2.1*  LATICACIDVEN  --  3.5* 3.7* 2.9*  --  2.8*  --     Liver Function Tests: Recent Labs  Lab 03/10/21 0133  03/10/21 0610 03/10/21 1701 03/11/21 0305 03/11/21 1619 03/12/21 0313 03/12/21 1642 03/13/21 0306  AST 1,560* 1,713*  --  1,754*  --  579*  --  330*  ALT 932* 1,003*  --  1,269*  --  891*  --  711*  ALKPHOS 65 54  --  58  --  68  --  91  BILITOT 6.0* 5.6*  --  4.7*  --  2.8*  --  2.3*  PROT 6.1* 5.5*  --  4.7*  --  4.7*  --  5.1*  ALBUMIN 3.4* 3.0*   < > 2.6* 2.6* 2.5*  2.5* 2.8* 2.7*  2.7*   < > = values in this interval not displayed.   Recent Labs  Lab 03/09/21 1643  LIPASE 37   Recent Labs  Lab 03/09/21 1643  AMMONIA 64*    ABG    Component Value Date/Time   PHART 7.141 (LL) 03/10/2021 0225   PCO2ART <19.0 (LL) 03/10/2021 0225   PO2ART 111 (H) 03/10/2021 0225   HCO3 20.5 03/10/2021 1701   TCO2 8 (L) 03/09/2021 1804   ACIDBASEDEF 2.1 (H) 03/10/2021 1701   O2SAT 93.1 03/10/2021 1701     Coagulation Profile: Recent Labs  Lab 03/09/21 2111 03/10/21 0610 03/10/21 2111 03/11/21 1236 03/12/21 0002  INR 2.8* 3.1* 2.4* 2.0* 1.7*    Cardiac Enzymes: Recent Labs  Lab 03/09/21 1643  CKTOTAL 219    HbA1C: Hgb A1c MFr Bld  Date/Time Value Ref Range Status  03/09/2021 08:50 PM 4.6 (L) 4.8 - 5.6 % Final    Comment:    (NOTE) Pre diabetes:          5.7%-6.4%  Diabetes:              >6.4%  Glycemic control for   <7.0% adults with diabetes   09/17/2019 07:02 PM 4.9 4.8 - 5.6 % Final    Comment:    (NOTE) Pre diabetes:          5.7%-6.4%  Diabetes:              >6.4%  Glycemic control for   <7.0% adults with diabetes     CBG: Recent Labs  Lab 03/11/21 1912 03/11/21 2311 03/12/21 0317 03/12/21 0717 03/12/21 1115  GLUCAP 148* 128* 107* 131* 115*    Review of Systems:     Surgical History:   Past Surgical History:  Procedure Laterality Date   APPENDECTOMY     KIDNEY STONE SURGERY         Critical care time: 20 minutes

## 2021-03-13 NOTE — Progress Notes (Signed)
eLink Physician-Brief Progress Note Patient Name: ZAKARIA SEDOR DOB: 11/03/1990 MRN: [id number]   Date of Service  03/13/2021  HPI/Events of Note  Pt tachycardic, c/o pain Rt knee.  RN asks for order for pain relief or low dose beta blockers.  Chart reviewed. Labs seen.  Last etoh a week ago. Off of pressors. S/p shock, s/p CRRT/AKI-  SBP > 155. HR 120 to 140's. Paon ok as per patient.   eICU Interventions  Metoprolol 12.5 mg oral once for now.      Intervention Category Intermediate Interventions: Hypertension - evaluation and management  Ranee Gosselin 03/13/2021, 12:22 AM

## 2021-03-13 NOTE — Plan of Care (Signed)
  Problem: Coping: Goal: Level of anxiety will decrease Outcome: Progressing   Problem: Elimination: Goal: Will not experience complications related to urinary retention Outcome: Completed/Met   Problem: Pain Managment: Goal: General experience of comfort will improve Outcome: Not Progressing   Problem: Safety: Goal: Ability to remain free from injury will improve Outcome: Progressing   Problem: Skin Integrity: Goal: Risk for impaired skin integrity will decrease Outcome: Progressing

## 2021-03-13 NOTE — Progress Notes (Addendum)
Daily Rounding Note  03/13/2021, 9:34 AM  LOS: 4 days   SUBJECTIVE:   Chief complaint:  DILI    R knee gout pain kept him up last night.  Eating well.   BM yesterday AM, no diarrhea.  No abd pain.   Bil LE edema, got lasix this AM  OBJECTIVE:         Vital signs in last 24 hours:    Temp:  [98.8 F (37.1 C)-99.9 F (37.7 C)] 99.4 F (37.4 C) (12/05 0710) Pulse Rate:  [102-125] 110 (12/05 0900) Resp:  [16-47] 26 (12/05 0900) BP: (140-166)/(78-100) 149/95 (12/05 0900) SpO2:  [87 %-99 %] 95 % (12/05 0900) Weight:  [103.9 kg] 103.9 kg (12/05 0315) Last BM Date: 03/12/21 Filed Weights   03/11/21 0234 03/12/21 0500 03/13/21 0315  Weight: 104 kg 103.7 kg 103.9 kg   General: looks a bit tired but not toxic or acutely ill   Heart: Tachy, regular Chest: dyspnea as he speaks.  Clear BS in front.  No cough Abdomen:  soft, NT, ND.  Active BS Extremities: 1 to 2 plus bil LE edema.  Swelling w/O redness right knee Neuro/Psych:  oriented x 3.  No asterixis or gross limb weakness.    Intake/Output from previous day: 12/04 0701 - 12/05 0700 In: 1820.1 [P.O.:1150; I.V.:60.2; IV Piggyback:609.9] Out: 4985 [Urine:4475]  Intake/Output this shift: Total I/O In: 0  Out: 380 [Other:380]  Lab Results: Recent Labs    03/11/21 1619 03/12/21 0313 03/13/21 0306  WBC 2.0* 1.6* 2.1*  HGB 11.2* 10.3* 10.6*  HCT 33.6* 30.6* 32.0*  PLT 79* 67* 64*   BMET Recent Labs    03/12/21 0313 03/12/21 1642 03/13/21 0306  NA 138 134* 134*  K 3.3* 4.0 4.3  CL 105 102 104  CO2 25 23 23   GLUCOSE 122* 112* 128*  BUN <5* 6 6  CREATININE 0.55* 0.62 0.75  CALCIUM 8.0* 8.7* 8.7*   LFT Recent Labs    03/11/21 0305 03/11/21 1619 03/12/21 0313 03/12/21 1642 03/13/21 0306  PROT 4.7*  --  4.7*  --  5.1*  ALBUMIN 2.6*   < > 2.5*  2.5* 2.8* 2.7*  2.7*  AST 1,754*  --  579*  --  330*  ALT 1,269*  --  891*  --  711*  ALKPHOS 58  --  68   --  91  BILITOT 4.7*  --  2.8*  --  2.3*  BILIDIR  --   --  1.2*  --  1.0*  IBILI  --   --  1.6*  --  1.3*   < > = values in this interval not displayed.   PT/INR Recent Labs    03/11/21 1236 03/12/21 0002  LABPROT 22.2* 19.5*  INR 2.0* 1.7*   Hepatitis Panel No results for input(s): HEPBSAG, HCVAB, HEPAIGM, HEPBIGM in the last 72 hours.  Studies/Results: No results found.  ASSESMENT:     Acute liver failure, presumed due to DILI from Allopurinol.  Received NAC 12/2  -12/4.  LFTs improving.  APAP/salicylate levels not elevatedANA negative.  Alpha 1 AT elevated to 52.  IgG, IgM, IgA normal.  Hepatitis A IgM, hepatitis B surface Ag, hepatitis B core IgM HCV Ab all nonreactive.  CMV IgM, EBV IgM not elevated.  ETOH not elevated.  HSV PCR pndg.  CTAP with fatty liver infiltration, no gallstones, bile, pancreatic duct normal.  Left kidney prominent suspicious for pyelonephritis.  Mild anasarca and mild abdominal/pelvic free fluid.  Abdominal ultrasound with hepatic steatosis and normal GB, biliary tree, Dopplers    Pancytopenia.  Hgb stable.  WBCs improving.  Platelets still declining, 64.    Coagulopathy.   INR 2.6 ... 1.7.      Hyperglycemia.  Macrocytosis.  Folic acid in place.    Lactic acidosis, improved.    Mild right ventricular enlargement with mild elevation pulmonary artery systolic pressure.    Gout.  Started allopurinol  ~ 1 month PTA.  Now stopped.  Colchicine in place.      AKI, treated w CRRT.  Resolved.     Pyelonephritis.  Rocephin in place.     PLAN     Supportive care.      Jennye Moccasin  03/13/2021, 9:34 AM Phone [phone]   I have discussed the case with the APP, and that is the plan I formulated. I personally interviewed and examined the patient.  Drug-induced liver injury, liver labs slowly improving.  Associated pancytopenia from physiologic stress to this illness, stable.  Persistently tachycardic, most likely from persistent physiologic  stress of the severe acute illness.  He is able to take food and liquids by mouth, has gotten pain, otherwise stable. Extensive infectious and serologic work-up negative for other causes of chronic liver disease, this is believed to be due to allopurinol that he started within the last 2 months.  Further observation, especially given persistent tachycardia.  No additional testing or new treatments planned from our treatment at this point.  We will follow.   (Patient ID family members at bedside during my visit today) Curtis Erickson Office: [phone]

## 2021-03-13 NOTE — Plan of Care (Signed)

## 2021-03-13 NOTE — Progress Notes (Signed)
Dr. Marlane Mingle notified of patients' HR in 120's-130's. Afebrile. Resting in bed. Lopressor X1 dose ordered PO. Continuing to monitor patient.

## 2021-03-13 NOTE — Progress Notes (Signed)
In an attempt to discontinue the foley cath, the patient stated that the "doctors" told him that he will keep the foley while taking lasix. (I didn't see an order for lasix) Patient also stated that he was "to swollen down there" and that he could not use a urinal when he had to pee. Reported this to the charge nurse. Will continue foley for now.

## 2021-03-13 NOTE — Progress Notes (Signed)
eLink Physician-Brief Progress Note Patient Name: PAYTEN BEAUMIER DOB: 01/29/1991 MRN: [id number]   Date of Service  03/13/2021  HPI/Events of Note  Pt c/o 4/10 right knee pain and has also developed a cough. Liver and renal failure.    eICU Interventions  Robitussin syrup prn and low dose dilaudid ordered. Avoiding tylenol or NSAID.         Ranee Gosselin 03/13/2021, 3:42 AM

## 2021-03-13 NOTE — Progress Notes (Signed)
[facility] NEPHROLOGY PROGRESS NOTE  Assessment/ Plan: Pt is a 30 y.o. yo male with history of gout, pyelonephritis, nephrolithiasis presents to the ER with nausea vomiting, weakness, anasarca in the setting of acute liver failure, AKI, severe acidosis and hyperkalemia.  #Severe hyperkalemia on admission: Improved with CRRT.  K WNL off CRRT  #Acute liver injury presumably due to DILI (allopurinol )but exact etiology unknown.  GI is following.  Trend liver enzyme.  Patient also with coagulopathy.  #Hyponatremia -likely related to hypervolemia. Na stable at 134.   #Edema -will trial a dose of lasix 40mg  iv x 1 dose, redose prn  #Acute kidney injury, nonoliguric: In the setting of acute liver failure likely HRS.  Started CRRT for anasarca, severe acidosis and hyperkalemia, stopped 12/4 given renal recovery. Recommend d/c'ing temp HD line.  #Anion gap metabolic acidosis/lactic acidosis: Improved s/p CRRT, monitor for now  #Hypophosphatemia: improved, replete prn  #Pancytopenia: With acute liver failure.  Monitor labs and evaluation ongoing per primary team.  14/4, MD [facility]  Subjective: Seen and examined.  Off crrt. No acute events. Uop ~4.1L. Still remains swollen  Objective Vital signs in last 24 hours: Vitals:   03/13/21 0315 03/13/21 0400 03/13/21 0500 03/13/21 0600  BP:  (!) 145/89 (!) 143/78 (!) 147/84  Pulse:  (!) 114 (!) 116 (!) 111  Resp:  (!) 38 (!) 34 (!) 31  Temp:      TempSrc:      SpO2:  92% 93% 93%  Weight: 103.9 kg     Height:       Weight change: 0.2 kg  Intake/Output Summary (Last 24 hours) at 03/13/2021 0747 Last data filed at 03/13/2021 0550 Gross per 24 hour  Intake 1590.1 ml  Output 4635 ml  Net -3044.9 ml       Labs: Basic Metabolic Panel: Recent Labs  Lab 03/12/21 0313 03/12/21 1642 03/13/21 0306  NA 138 134* 134*  K 3.3* 4.0 4.3  CL 105 102 104  CO2 25 23 23   GLUCOSE 122* 112* 128*  BUN  <5* 6 6  CREATININE 0.55* 0.62 0.75  CALCIUM 8.0* 8.7* 8.7*  PHOS 2.1* 3.2 3.3   Liver Function Tests: Recent Labs  Lab 03/11/21 0305 03/11/21 1619 03/12/21 0313 03/12/21 1642 03/13/21 0306  AST 1,754*  --  579*  --  330*  ALT 1,269*  --  891*  --  711*  ALKPHOS 58  --  68  --  91  BILITOT 4.7*  --  2.8*  --  2.3*  PROT 4.7*  --  4.7*  --  5.1*  ALBUMIN 2.6*   < > 2.5*  2.5* 2.8* 2.7*  2.7*   < > = values in this interval not displayed.   Recent Labs  Lab 03/09/21 1643  LIPASE 37   Recent Labs  Lab 03/09/21 1643  AMMONIA 64*   CBC: Recent Labs  Lab 03/09/21 1643 03/09/21 1804 03/10/21 0610 03/10/21 1701 03/11/21 0305 03/11/21 1619 03/12/21 0313 03/13/21 0306  WBC 7.3  --  6.3 4.2 2.5* 2.0* 1.6* 2.1*  NEUTROABS 3.9  --  4.3  --   --   --   --   --   HGB 11.9*   < > 9.9* 10.0* 9.9* 11.2* 10.3* 10.6*  HCT 37.4*   < > 32.5* 29.9* 29.7* 33.6* 30.6* 32.0*  MCV 117.2*  --  121.3* 110.7* 110.0* 112.8* 112.1* 112.3*  PLT 294  --  174 112*  81* 79* 67* 64*   < > = values in this interval not displayed.   Cardiac Enzymes: Recent Labs  Lab 03/09/21 1643  CKTOTAL 219   CBG: Recent Labs  Lab 03/11/21 1912 03/11/21 2311 03/12/21 0317 03/12/21 0717 03/12/21 1115  GLUCAP 148* 128* 107* 131* 115*    Iron Studies: No results for input(s): IRON, TIBC, TRANSFERRIN, FERRITIN in the last 72 hours. Studies/Results: No results found.  Medications: Infusions:  cefTRIAXone (ROCEPHIN)  IV Stopped (03/12/21 1040)    Scheduled Medications:  Chlorhexidine Gluconate Cloth  6 each Topical Daily   folic acid  1 mg Oral Daily   heparin injection (subcutaneous)  5,000 Units Subcutaneous Q8H   mouth rinse  15 mL Mouth Rinse BID   sodium chloride flush  10-40 mL Intracatheter Q12H   thiamine  100 mg Oral Daily    have reviewed scheduled and prn medications.  Physical Exam: General: Lying flat in bed comfortable, nad Heart:RRR, s1s2 nl Lungs: Clear anteriorly, no  wheezing or crackle Abdomen:soft, Non-tender, non-distended Extremities: Bilateral lower extremities pitting edema mildly improved Dialysis Access: Temporary IJ HD catheter  Tyreanna Bisesi 03/13/2021,7:47 AM  LOS: 4 days

## 2021-03-14 LAB — CBC WITH DIFFERENTIAL/PLATELET
Abs Immature Granulocytes: 0.02 10*3/uL (ref 0.00–0.07)
Basophils Absolute: 0 10*3/uL (ref 0.0–0.1)
Basophils Relative: 1 %
Eosinophils Absolute: 0.1 10*3/uL (ref 0.0–0.5)
Eosinophils Relative: 5 %
HCT: 31.5 % — ABNORMAL LOW (ref 39.0–52.0)
Hemoglobin: 10.4 g/dL — ABNORMAL LOW (ref 13.0–17.0)
Immature Granulocytes: 1 %
Lymphocytes Relative: 39 %
Lymphs Abs: 0.8 10*3/uL (ref 0.7–4.0)
MCH: 37 pg — ABNORMAL HIGH (ref 26.0–34.0)
MCHC: 33 g/dL (ref 30.0–36.0)
MCV: 112.1 fL — ABNORMAL HIGH (ref 80.0–100.0)
Monocytes Absolute: 0.2 10*3/uL (ref 0.1–1.0)
Monocytes Relative: 9 %
Neutro Abs: 0.9 10*3/uL — ABNORMAL LOW (ref 1.7–7.7)
Neutrophils Relative %: 45 %
Platelets: 58 10*3/uL — ABNORMAL LOW (ref 150–400)
RBC: 2.81 MIL/uL — ABNORMAL LOW (ref 4.22–5.81)
RDW: 15.9 % — ABNORMAL HIGH (ref 11.5–15.5)
WBC: 2 10*3/uL — ABNORMAL LOW (ref 4.0–10.5)
nRBC: 0 % (ref 0.0–0.2)

## 2021-03-14 LAB — COMPREHENSIVE METABOLIC PANEL
ALT: 462 U/L — ABNORMAL HIGH (ref 0–44)
AST: 164 U/L — ABNORMAL HIGH (ref 15–41)
Albumin: 2.7 g/dL — ABNORMAL LOW (ref 3.5–5.0)
Alkaline Phosphatase: 74 U/L (ref 38–126)
Anion gap: 7 (ref 5–15)
BUN: 10 mg/dL (ref 6–20)
CO2: 22 mmol/L (ref 22–32)
Calcium: 8.6 mg/dL — ABNORMAL LOW (ref 8.9–10.3)
Chloride: 100 mmol/L (ref 98–111)
Creatinine, Ser: 0.65 mg/dL (ref 0.61–1.24)
GFR, Estimated: >60 mL/min (ref >60)
Glucose, Bld: 164 mg/dL — ABNORMAL HIGH (ref 70–99)
Potassium: 4.1 mmol/L (ref 3.5–5.1)
Sodium: 129 mmol/L — ABNORMAL LOW (ref 135–145)
Total Bilirubin: 2.2 mg/dL — ABNORMAL HIGH (ref 0.3–1.2)
Total Protein: 5 g/dL — ABNORMAL LOW (ref 6.5–8.1)

## 2021-03-14 LAB — MAGNESIUM: Magnesium: 1.6 mg/dL — ABNORMAL LOW (ref 1.7–2.4)

## 2021-03-14 MED ORDER — FUROSEMIDE 10 MG/ML IJ SOLN
40.0000 mg | Freq: Once | INTRAMUSCULAR | Status: AC
Start: 2021-03-14 — End: 2021-03-14
  Administered 2021-03-14: 40 mg via INTRAVENOUS
  Filled 2021-03-14: qty 4

## 2021-03-14 MED ORDER — MAGNESIUM SULFATE 4 GM/100ML IV SOLN
4.0000 g | Freq: Once | INTRAVENOUS | Status: AC
  Administered 2021-03-14: 4 g via INTRAVENOUS
  Filled 2021-03-14 (×2): qty 100

## 2021-03-14 MED ORDER — HYDROMORPHONE HCL 1 MG/ML IJ SOLN: 0.5000 mg | INTRAMUSCULAR | Status: DC | PRN

## 2021-03-14 MED ORDER — OXYCODONE HCL 5 MG PO TABS: 5.0000 mg | ORAL_TABLET | Freq: Four times a day (QID) | ORAL | Status: DC | PRN

## 2021-03-14 NOTE — Progress Notes (Signed)
eLink Physician-Brief Progress Note Patient Name: ZAKRY CASO DOB: 10-15-1990 MRN: [id number]   Date of Service  03/14/2021  HPI/Events of Note  Mg++ 1.6  eICU Interventions  4 gm of magnesium sulfate ordered per E-Link electrolyte replacement protocol.        Thomasene Lot Camyah Pultz 03/14/2021, 3:22 AM

## 2021-03-14 NOTE — Progress Notes (Signed)
[name] NEPHROLOGY PROGRESS NOTE  Assessment/ Plan: Pt is a 30 y.o. yo male with history of gout, pyelonephritis, nephrolithiasis presents to the ER with nausea vomiting, weakness, anasarca in the setting of acute liver failure, AKI, severe acidosis and hyperkalemia.  #Severe hyperkalemia on admission: Improved with CRRT.  K WNL off CRRT  #Acute liver injury presumably due to DILI (allopurinol ).  GI is following.  LFT's improving.  #Hyponatremia -likely related to hypervolemia. Na down to 129, redose lasix today  #Edema -redose lasix 40mg  IV today  #Acute kidney injury, nonoliguric: In the setting of acute liver failure likely HRS.  Started CRRT for anasarca, severe acidosis and hyperkalemia, stopped 12/4 given renal recovery. Recommend d/c'ing temp HD line, discussed with ICU RN.  #Anion gap metabolic acidosis/lactic acidosis: Improved s/p CRRT, bicarb wnl. monitor for now  #Hypophosphatemia: improved, replete prn  #Pancytopenia: With acute liver failure.  Monitor labs and evaluation ongoing per primary team.  14/4, MD [facility]  Subjective: Seen and examined.  No acute events. Received lasix 40mg  iv x 1 dose yesterday, he reports that he noticed a difference. Arms not as swollen anymore. No other complaints. Uop 6.5L  Objective Vital signs in last 24 hours: Vitals:   03/14/21 0500 03/14/21 0600 03/14/21 0700 03/14/21 0711  BP: (!) 152/85 (!) 143/83 140/89   Pulse: (!) 117 (!) 114 (!) 106   Resp: (!) 33 (!) 39 (!) 29   Temp:    99.5 F (37.5 C)  TempSrc:    Oral  SpO2: 93% 92% (!) 86%   Weight:      Height:       Weight change: 0.1 kg  Intake/Output Summary (Last 24 hours) at 03/14/2021 0742 Last data filed at 03/14/2021 0700 Gross per 24 hour  Intake 691.11 ml  Output 9430 ml  Net -8738.89 ml       Labs: Basic Metabolic Panel: Recent Labs  Lab 03/12/21 1642 03/13/21 0306 03/13/21 1600 03/14/21 0211  NA 134*  134* 135 129*  K 4.0 4.3 4.6 4.1  CL 102 104 102 100  CO2 23 23 25 22   GLUCOSE 112* 128* 90 164*  BUN 6 6 8 10   CREATININE 0.62 0.75 0.69 0.65  CALCIUM 8.7* 8.7* 9.0 8.6*  PHOS 3.2 3.3 3.7  --    Liver Function Tests: Recent Labs  Lab 03/12/21 0313 03/12/21 1642 03/13/21 0306 03/13/21 1600 03/14/21 0211  AST 579*  --  330*  --  164*  ALT 891*  --  711*  --  462*  ALKPHOS 68  --  91  --  74  BILITOT 2.8*  --  2.3*  --  2.2*  PROT 4.7*  --  5.1*  --  5.0*  ALBUMIN 2.5*  2.5*   < > 2.7*  2.7* 2.8* 2.7*   < > = values in this interval not displayed.   Recent Labs  Lab 03/09/21 1643  LIPASE 37   Recent Labs  Lab 03/09/21 1643  AMMONIA 64*   CBC: Recent Labs  Lab 03/09/21 1643 03/09/21 1804 03/10/21 0610 03/10/21 1701 03/11/21 0305 03/11/21 1619 03/12/21 0313 03/13/21 0306 03/14/21 0211  WBC 7.3  --  6.3   < > 2.5* 2.0* 1.6* 2.1* 2.0*  NEUTROABS 3.9  --  4.3  --   --   --   --   --  0.9*  HGB 11.9*   < > 9.9*   < > 9.9* 11.2* 10.3*  10.6* 10.4*  HCT 37.4*   < > 32.5*   < > 29.7* 33.6* 30.6* 32.0* 31.5*  MCV 117.2*  --  121.3*   < > 110.0* 112.8* 112.1* 112.3* 112.1*  PLT 294  --  174   < > 81* 79* 67* 64* 58*   < > = values in this interval not displayed.   Cardiac Enzymes: Recent Labs  Lab 03/09/21 1643  CKTOTAL 219   CBG: Recent Labs  Lab 03/11/21 1912 03/11/21 2311 03/12/21 0317 03/12/21 0717 03/12/21 1115  GLUCAP 148* 128* 107* 131* 115*    Iron Studies: No results for input(s): IRON, TIBC, TRANSFERRIN, FERRITIN in the last 72 hours. Studies/Results: No results found.  Medications: Infusions:  cefTRIAXone (ROCEPHIN)  IV Stopped (03/13/21 0929)    Scheduled Medications:  Chlorhexidine Gluconate Cloth  6 each Topical Daily   colchicine  0.6 mg Oral Daily   folic acid  1 mg Oral Daily   heparin injection (subcutaneous)  5,000 Units Subcutaneous Q8H   mouth rinse  15 mL Mouth Rinse BID   predniSONE  40 mg Oral Q breakfast   sodium  chloride flush  10-40 mL Intracatheter Q12H   thiamine  100 mg Oral Daily    have reviewed scheduled and prn medications.  Physical Exam: General: Lying flat in bed comfortable, nad Heart:RRR, s1s2 nl Lungs: Clear anteriorly, no wheezing or crackle Abdomen:soft, Non-tender, non-distended Extremities: Bilateral lower extremities pitting edema, scrotal swelling Dialysis Access: Temporary IJ HD catheter  Curtis Erickson 03/14/2021,7:42 AM  LOS: 5 days

## 2021-03-14 NOTE — Progress Notes (Signed)
Subjective:   ICU TRANSFER NOTE:  [name] is a 30 year old male with a past medical history of recurrent gout who presented to the ED with complaints of nausea, vomiting, shortness of breath and lower extremity edema for approximately 1 month.  Initial lab work demonstrated severe liver injury with AST of 1528 and ALT of 918 with a total bilirubin of 5.3.  INR was elevated at 1.6.  In addition, course was complicated by severe AGMA secondary to lactic acidosis with AKI.  On day of admission, GI was consulted; acute liver injury suspected to be due to drug-induced liver injury in the setting of recent initiation of allopurinol approximately 2 months prior.  Due to uncertainty of potential unknown substance ingestion, acetylcysteine was initiated.  Critical care was consulted on admission due to refractory acidemia, severe hyperkalemia with EKG changes, and anasarca. He was transferred to the ICU for closer monitoring.  Upon transfer to the ICU, nephrology was consulted and CRRT was initiated.  Patient required CRRT for approximately for approximately 48 hours with recovery of renal function.  CRRT was discontinued on 12/4.  Hospital course was also complicated by septic shock secondary to pyelonephritis treated with 5 days of Rocephin.  I\MTS was contacted to resume care on 12/6.  INTERVAL HISTORY: No acute overnight events noted.  Curtis Erickson states he is doing well this morning.  He denies any acute complaints and notes that his right knee pain is getting better.  Curtis Erickson describes a several year history of recurrent gout of unknown etiology.  He denies any other family history of gout.  Objective:  Vital signs in last 24 hours: Vitals:   03/14/21 1000 03/14/21 1100 03/14/21 1137 03/14/21 1200  BP: (!) 150/93 (!) 143/97  (!) 142/93  Pulse: (!) 115 (!) 108  (!) 103  Resp: (!) 30 (!) 25  (!) 25  Temp:   98.8 F (37.1 C)   TempSrc:   Oral   SpO2: 93% 96%  92%  Weight:      Height:        Physical Exam Vitals and nursing note reviewed.  Constitutional:      General: He is not in acute distress.    Appearance: He is normal weight.  HENT:     Head: Normocephalic and atraumatic.  Eyes:     Extraocular Movements: Extraocular movements intact.     Pupils: Pupils are equal, round, and reactive to light.  Cardiovascular:     Rate and Rhythm: Regular rhythm. Tachycardia present.     Heart sounds: No murmur heard. Pulmonary:     Effort: Pulmonary effort is normal. No respiratory distress.     Breath sounds: Normal breath sounds. No wheezing, rhonchi or rales.  Abdominal:     General: Bowel sounds are normal. There is no distension.     Palpations: Abdomen is soft.     Tenderness: There is no abdominal tenderness. There is no guarding.  Musculoskeletal:     Right knee: Swelling (significant swelling noted with increased warmth to touch) present. Tenderness (throughout) present.     Right lower leg: Edema (trace pitting) present.     Left lower leg: Edema (trace pitting) present.  Skin:    General: Skin is warm and dry.  Neurological:     General: No focal deficit present.     Mental Status: He is alert and oriented to person, place, and time. Mental status is at baseline.  Psychiatric:  Mood and Affect: Mood normal.        Behavior: Behavior normal.        Thought Content: Thought content normal.        Judgment: Judgment normal.   Assessment/Plan:  Principal Problem:   Acute hepatitis Active Problems:   Alcoholic fatty liver   Acute hypoxemic respiratory failure (HCC)   Gout   High anion gap metabolic acidosis   Respiratory alkalosis   Sinus tachycardia   AKI (acute kidney injury) (HCC)   Lactic acidosis   Anasarca   Liver injury   Hepatorenal failure [facility])  Curtis Erickson is a 30 year old male with a past medical history of recurrent gout who presented to the ED with complaints of N/V with shortness of breath and edema admitted for acute liver failure  secondary to drug-induced liver injury, AKI secondary to hepatorenal failure complicated by hyperkalemia, refractory acidemia and anasarca, pyelonephritis complicated by septic shock, and pancytopenia.  # Acute Liver Failure 2/2 DILI  Etiology presumed to be secondary to allopurinol.  Work-up including right upper quadrant ultrasound, hepatic Dopplers, Tylenol levels, salicylic acid levels, ANA, alpha 1 AT, IgG, IgM, IgA, hepatitis A IgM, hepatitis B, hepatitis C, CMV, EBV, HIV, and HSV are all negative.  Liver biopsy was not pursued due to hemodynamic instability on admission.  LFTs continue to improve today.  - GI following; appreciate their recommendations - Continue to monitor LFTs daily  # Acute Kidney Injury In the setting of hepatorenal syndrome.  No longer requiring CRRT with good urine output and improvement in electrolytes.  - Nephrology following; appreciate their recommendations - Strict ins and outs - Daily weights - Avoid nephrotoxic agents  # Recurrent Gout Persistently elevated uric acid since 2020 complicated gout (generally affecting the knees) and nephrolithiasis.  Most recent uric acid prior to admission in October 2022 elevated at 10.5.  Patient states previous knee arthrocentesis has detected crystals, however only arthrocentesis results I can find in the chart from 2021 demonstrate elevated WBC at 42,000 with 100% neutrophil and no crystals seen.    Repeat episode of gout affecting right knee occurred during this hospitalization in the setting of critical illness.   - Continue Colchicine 0.6 mg daily - Continue Prednisone 40 mg daily  - Consider outpatient referral to rheumatology for management of gout  # Anasarca # Hyponatremia  Overall, volume status improving, however hyponatremia reoccurred today.  Good urine output noted. Will repeat Lasix dose.   - Lasix 40 mg IV x 1  # Pancytopenia  White blood count, hemoglobin and platelets continue to be decreased but  stable at this time.  Suspect this may be secondary to acute illness leading to bone marrow suppression.   - Monitor CBC daily - Transfuse as needed for hemoglobin less than 10 - Transfuse as needed for platelets less than 10  # Hypomagnesemia  - 4 g of mag sulfate administered - Repeat magnesium level tomorrow morning  # Alcohol Abuse  History of potentially high risk alcohol use, with consumption of greater than recommended amounts.  No risk at this time for withdrawal.  # Sinus Tachycardia  Sinus tachycardia noted during admission likely in the setting of acute illness.  It has persisted despite clinical improvement.  Patient has already completed 5-day course of antibiotics for pyelonephritis, so low suspicion for infectious etiology at this time.  We will work on managing pain control and monitoring closely.  Resolved:  # Severe Hyperkalemia # Septic Shock 2/2 Pyelonephritis  # Hypophosphatemia  Prior to Admission Living Arrangement: Home  Anticipated Discharge Location: Home Barriers to Discharge: Continued medical stabilization with monitoring of blood work daily   Dispo: Anticipated discharge in approximately 2-3 day(s).   Dr. Verdene Lennert Internal Medicine PGY-3 Pager: [phone] After 5pm on weekdays and 1pm on weekends: On Call pager [phone]  03/14/2021, 1:43 PM

## 2021-03-14 NOTE — Progress Notes (Signed)
Daily Rounding Note  03/14/2021, 10:37 AM  LOS: 5 days   SUBJECTIVE:   Chief complaint: DILI     Still w HR in low 100s to 1teens.  BPs normal to slightly diastolic hypertensive.  Sats on room air mid 80s x 1, to to low 90s.   Pt eating well, no GI complaints.  L knee pain persists.  Energy level better  OBJECTIVE:         Vital signs in last 24 hours:    Temp:  [98.8 F (37.1 C)-100.1 F (37.8 C)] 99.5 F (37.5 C) (12/06 0711) Pulse Rate:  [106-122] 115 (12/06 1000) Resp:  [20-40] 33 (12/06 1000) BP: (138-165)/(73-102) 150/93 (12/06 1000) SpO2:  [86 %-97 %] 93 % (12/06 1000) Weight:  [104 kg] 104 kg (12/06 0216) Last BM Date: 03/12/21 Filed Weights   03/12/21 0500 03/13/21 0315 03/14/21 0216  Weight: 103.7 kg 103.9 kg 104 kg   General: looks less tired.  Not toxic looking.  jaundiced   Heart: RR, tachy Chest: no labored breathing.  No cough Abdomen: soft, NT.  Active BS.  No distention  Extremities: swelling w/O redness in R knee.  Slight swelling in feet bil Neuro/Psych:  oriented x 3.  No tremors or gross deficits.    Intake/Output from previous day: 12/05 0701 - 12/06 0700 In: 691.1 [P.O.:520; IV Piggyback:171.1] Out: 9430 [Urine:6550]  Intake/Output this shift: Total I/O In: 129 [IV Piggyback:129] Out: 3400 [Urine:3400]  Lab Results: Recent Labs    03/12/21 0313 03/13/21 0306 03/14/21 0211  WBC 1.6* 2.1* 2.0*  HGB 10.3* 10.6* 10.4*  HCT 30.6* 32.0* 31.5*  PLT 67* 64* 58*   BMET Recent Labs    03/13/21 0306 03/13/21 1600 03/14/21 0211  NA 134* 135 129*  K 4.3 4.6 4.1  CL 104 102 100  CO2 23 25 22   GLUCOSE 128* 90 164*  BUN 6 8 10   CREATININE 0.75 0.69 0.65  CALCIUM 8.7* 9.0 8.6*   LFT Recent Labs    03/12/21 0313 03/12/21 1642 03/13/21 0306 03/13/21 1600 03/14/21 0211  PROT 4.7*  --  5.1*  --  5.0*  ALBUMIN 2.5*  2.5*   < > 2.7*  2.7* 2.8* 2.7*  AST 579*  --  330*  --   164*  ALT 891*  --  711*  --  462*  ALKPHOS 68  --  91  --  74  BILITOT 2.8*  --  2.3*  --  2.2*  BILIDIR 1.2*  --  1.0*  --   --   IBILI 1.6*  --  1.3*  --   --    < > = values in this interval not displayed.   PT/INR Recent Labs    03/11/21 1236 03/12/21 0002  LABPROT 22.2* 19.5*  INR 2.0* 1.7*   Hepatitis Panel No results for input(s): HEPBSAG, HCVAB, HEPAIGM, HEPBIGM in the last 72 hours.  Studies/Results: No results found.  Scheduled Meds:  Chlorhexidine Gluconate Cloth  6 each Topical Daily   colchicine  0.6 mg Oral Daily   folic acid  1 mg Oral Daily   heparin injection (subcutaneous)  5,000 Units Subcutaneous Q8H   mouth rinse  15 mL Mouth Rinse BID   predniSONE  40 mg Oral Q breakfast   thiamine  100 mg Oral Daily   Continuous Infusions: PRN Meds:.dextrose, guaiFENesin, ondansetron **OR** ondansetron (ZOFRAN) IV   ASSESMENT:      DILI.  Attributed to allopurinol. Completed NAC.  Work-up for infectious, autoimmune liver disease unrevealing.  Fatty liver on CT scan and ultrasound.  Consumes alcohol, 3 wine coolers daily.  LFTs static for the last 24 hours but overall improved.     Hyponatremia.  Na stable at 129 since yesterday.    Coagulopathy.  INR 1.7     Macrocytic anemia.  Folate level low, on po supplement.  Was on po iron Vit b12 and folate PTA.       Thrombocytopenia.  No splenomegaly on imaging.      Gout, R knee pain/flare.  Now on colchicine, Prednisone.      Hyperglycemia, started before prednisone added.  No prior hx DM.      Pyelonephritis.  Finished 5 d Rocephin.     PLAN     Supportive care.  LFTs daily until consistent improvement/decline.   INR in AM.  Follow platelets/CBC.      Jennye Moccasin  03/14/2021, 10:37 AM Phone [phone]

## 2021-03-15 ENCOUNTER — Other Ambulatory Visit (HOSPITAL_COMMUNITY): Payer: Self-pay

## 2021-03-15 DIAGNOSIS — K767 Hepatorenal syndrome: Secondary | ICD-10-CM

## 2021-03-15 DIAGNOSIS — K7200 Acute and subacute hepatic failure without coma: Secondary | ICD-10-CM

## 2021-03-15 DIAGNOSIS — J9601 Acute respiratory failure with hypoxia: Secondary | ICD-10-CM

## 2021-03-15 LAB — COMPREHENSIVE METABOLIC PANEL
ALT: 356 U/L — ABNORMAL HIGH (ref 0–44)
AST: 111 U/L — ABNORMAL HIGH (ref 15–41)
Albumin: 3 g/dL — ABNORMAL LOW (ref 3.5–5.0)
Alkaline Phosphatase: 92 U/L (ref 38–126)
Anion gap: 11 (ref 5–15)
BUN: 13 mg/dL (ref 6–20)
CO2: 21 mmol/L — ABNORMAL LOW (ref 22–32)
Calcium: 9.2 mg/dL (ref 8.9–10.3)
Chloride: 101 mmol/L (ref 98–111)
Creatinine, Ser: 0.68 mg/dL (ref 0.61–1.24)
GFR, Estimated: >60 mL/min (ref >60)
Glucose, Bld: 183 mg/dL — ABNORMAL HIGH (ref 70–99)
Potassium: 4.3 mmol/L (ref 3.5–5.1)
Sodium: 133 mmol/L — ABNORMAL LOW (ref 135–145)
Total Bilirubin: 1.8 mg/dL — ABNORMAL HIGH (ref 0.3–1.2)
Total Protein: 6 g/dL — ABNORMAL LOW (ref 6.5–8.1)

## 2021-03-15 LAB — CBC WITH DIFFERENTIAL/PLATELET
Abs Immature Granulocytes: 0.02 10*3/uL (ref 0.00–0.07)
Basophils Absolute: 0 10*3/uL (ref 0.0–0.1)
Basophils Relative: 0 %
Eosinophils Absolute: 0 10*3/uL (ref 0.0–0.5)
Eosinophils Relative: 0 %
HCT: 36.3 % — ABNORMAL LOW (ref 39.0–52.0)
Hemoglobin: 11.6 g/dL — ABNORMAL LOW (ref 13.0–17.0)
Immature Granulocytes: 1 %
Lymphocytes Relative: 25 %
Lymphs Abs: 0.6 10*3/uL — ABNORMAL LOW (ref 0.7–4.0)
MCH: 35.6 pg — ABNORMAL HIGH (ref 26.0–34.0)
MCHC: 32 g/dL (ref 30.0–36.0)
MCV: 111.3 fL — ABNORMAL HIGH (ref 80.0–100.0)
Monocytes Absolute: 0.3 10*3/uL (ref 0.1–1.0)
Monocytes Relative: 11 %
Neutro Abs: 1.4 10*3/uL — ABNORMAL LOW (ref 1.7–7.7)
Neutrophils Relative %: 63 %
Platelets: 74 10*3/uL — ABNORMAL LOW (ref 150–400)
RBC: 3.26 MIL/uL — ABNORMAL LOW (ref 4.22–5.81)
RDW: 15.1 % (ref 11.5–15.5)
WBC: 2.3 10*3/uL — ABNORMAL LOW (ref 4.0–10.5)
nRBC: 0 % (ref 0.0–0.2)

## 2021-03-15 LAB — CULTURE, BLOOD (ROUTINE X 2)
Culture: NO GROWTH
Culture: NO GROWTH
Special Requests: ADEQUATE

## 2021-03-15 LAB — PROTIME-INR
INR: 1.1 (ref 0.8–1.2)
Prothrombin Time: 13.7 seconds (ref 11.4–15.2)

## 2021-03-15 LAB — MAGNESIUM: Magnesium: 2.1 mg/dL (ref 1.7–2.4)

## 2021-03-15 LAB — PATHOLOGIST SMEAR REVIEW

## 2021-03-15 LAB — PHOSPHORUS: Phosphorus: 3.2 mg/dL (ref 2.5–4.6)

## 2021-03-15 MED ORDER — COLCHICINE 0.6 MG PO TABS
0.6000 mg | ORAL_TABLET | Freq: Every day | ORAL | 0 refills | Status: DC
  Filled 2021-03-15: qty 5, 5d supply, fill #0

## 2021-03-15 MED ORDER — PREDNISONE 20 MG PO TABS
40.0000 mg | ORAL_TABLET | Freq: Every day | ORAL | 0 refills | Status: AC
  Filled 2021-03-15: qty 10, 5d supply, fill #0

## 2021-03-15 NOTE — Progress Notes (Signed)
Patient discharged to home via wheelchair with all belongings. Bariatric Rolator and 3 in 1 commode taken with patient at discharge.

## 2021-03-15 NOTE — Evaluation (Signed)
Physical Therapy Evaluation Patient Details Name: Curtis Erickson MRN: [id number] DOB: [birth year] Today's Date: 03/15/2021  History of Present Illness  30 yo male with onset of symptoms since starting gout drug was admitted 12/1 for N&V, SOB, LE edema.  Currently also in pain in hands and R knee from gout.  PMHx:  gout, pyelonephritis,  Clinical Impression  Pt was seen for testing of gait and getting on stairs toward being discharged home.  He is able to manage his gout pain with a rollator and use of sequence for stairs to avoid overstressing R knee.  He is motivated to work and so cautioned him about overdoing the use of the walker for longer trips.  Encouraged him to sit on walker in house to rest the knee when getting things done that would otherwise require longer standing.  Follow as his stay requires, to increase safety and distance with gait as needed.       Recommendations for follow up therapy are one component of a multi-disciplinary discharge planning process, led by the attending physician.  Recommendations may be updated based on patient status, additional functional criteria and insurance authorization.  Follow Up Recommendations No PT follow up    Assistance Recommended at Discharge Intermittent Supervision/Assistance  Functional Status Assessment Patient has had a recent decline in their functional status and demonstrates the ability to make significant improvements in function in a reasonable and predictable amount of time.  Equipment Recommendations  Rollator (4 wheels);BSC/3in1    Recommendations for Other Services       Precautions / Restrictions Precautions Precautions: Other (comment) Precaution Comments: hand and R knee pain with WB Restrictions Weight Bearing Restrictions: No      Mobility  Bed Mobility Overal bed mobility: Modified Independent             General bed mobility comments: extra time but able to move    Transfers Overall transfer  level: Modified independent Equipment used: Rollator (4 wheels)               General transfer comment: walker for safety but stands with hands on the bed    Ambulation/Gait Ambulation/Gait assistance: Min guard Gait Distance (Feet): 75 Feet Assistive device: Rollator (4 wheels);1 person hand held assist Gait Pattern/deviations: Step-to pattern;Step-through pattern;Decreased stride length;Decreased weight shift to right;Wide base of support Gait velocity: reduced Gait velocity interpretation: <1.31 ft/sec, indicative of household ambulator   General Gait Details: minimizing pressure on R knee as hands on walker allow  Stairs Stairs: Yes Stairs assistance: Min guard Stair Management: One rail Right;Step to pattern;Forwards Number of Stairs: 8 General stair comments: took a bit of time to carefully pattern for safety and comfort  Wheelchair Mobility    Modified Rankin (Stroke Patients Only)       Balance Overall balance assessment: Needs assistance Sitting-balance support: Feet supported Sitting balance-Leahy Scale: Good     Standing balance support: Bilateral upper extremity supported;During functional activity Standing balance-Leahy Scale: Fair                               Pertinent Vitals/Pain Pain Assessment: Faces Faces Pain Scale: Hurts little more Pain Location: R knee with WB Pain Descriptors / Indicators: Guarding;Grimacing Pain Intervention(s): Limited activity within patient's tolerance;Monitored during session;Premedicated before session;Repositioned    Home Living Family/patient expects to be discharged to:: Private residence Living Arrangements: Spouse/significant other;Children Available Help at Discharge: Family;Available 24 hours/day Type of  Home: House Home Access: Stairs to enter Entrance Stairs-Rails: Can reach both Entrance Stairs-Number of Steps: 4   Home Layout: One level Home Equipment: None Additional Comments: works  [employer]    Prior Function Prior Level of Function : Independent/Modified Independent             Mobility Comments: no falls no need for AD       Hand Dominance   Dominant Hand: Right    Extremity/Trunk Assessment   Upper Extremity Assessment Upper Extremity Assessment: Overall WFL for tasks assessed    Lower Extremity Assessment Lower Extremity Assessment: RLE deficits/detail RLE Deficits / Details: moderate effusion L knee, painful to flex RLE Coordination: decreased gross motor    Cervical / Trunk Assessment Cervical / Trunk Assessment: Normal  Communication   Communication: No difficulties  Cognition Arousal/Alertness: Awake/alert Behavior During Therapy: WFL for tasks assessed/performed Overall Cognitive Status: Within Functional Limits for tasks assessed                                          General Comments General comments (skin integrity, edema, etc.): pt was able to walk with rollator and cues for avoiding stressing hand joints, and with walker could minimize R knee pain and antalgic gait    Exercises     Assessment/Plan    PT Assessment Patient needs continued PT services  PT Problem List Decreased strength;Decreased range of motion;Decreased activity tolerance;Decreased balance;Decreased mobility;Pain       PT Treatment Interventions DME instruction;Gait training;Stair training;Functional mobility training;Therapeutic activities;Therapeutic exercise;Balance training;Neuromuscular re-education;Patient/family education    PT Goals (Current goals can be found in the Care Plan section)  Acute Rehab PT Goals Patient Stated Goal: to get rid of knee pain PT Goal Formulation: With patient Time For Goal Achievement: 03/22/21 Potential to Achieve Goals: Good    Frequency Min 3X/week   Barriers to discharge Inaccessible home environment;Decreased caregiver support has stairs to enter house but also some time with no help     Co-evaluation               AM-PAC PT "6 Clicks" Mobility  Outcome Measure Help needed turning from your back to your side while in a flat bed without using bedrails?: None Help needed moving from lying on your back to sitting on the side of a flat bed without using bedrails?: A Little Help needed moving to and from a bed to a chair (including a wheelchair)?: A Little Help needed standing up from a chair using your arms (e.g., wheelchair or bedside chair)?: A Little Help needed to walk in hospital room?: A Little Help needed climbing 3-5 steps with a railing? : A Little 6 Click Score: 19    End of Session Equipment Utilized During Treatment: Gait belt Activity Tolerance: Patient tolerated treatment well;Patient limited by pain Patient left: in chair;with call bell/phone within reach Nurse Communication: Mobility status PT Visit Diagnosis: Unsteadiness on feet (R26.81);Difficulty in walking, not elsewhere classified (R26.2);Pain Pain - Right/Left: Right Pain - part of body: Knee;Hand    Time: 1062-6948 PT Time Calculation (min) (ACUTE ONLY): 38 min   Charges:   PT Evaluation $PT Eval Moderate Complexity: 1 Mod PT Treatments $Gait Training: 8-22 mins $Therapeutic Activity: 8-22 mins       Ivar Drape 03/15/2021, 12:52 PM  Samul Dada, PT PhD Acute Rehab Dept. Number: [facility] [phone] and  [place] [phone]

## 2021-03-15 NOTE — Progress Notes (Signed)
Bariatric Rolator and 3 in one commode delivered to pt room.

## 2021-03-15 NOTE — Progress Notes (Signed)
Discharge instructions given to pt. Pt verbalized understanding of all teaching ?

## 2021-03-15 NOTE — Progress Notes (Signed)
[facility] NEPHROLOGY PROGRESS NOTE  Assessment/ Plan: Pt is a 30 y.o. yo male with history of gout, pyelonephritis, nephrolithiasis presents to the ER with nausea vomiting, weakness, anasarca in the setting of acute liver failure, AKI, severe acidosis and hyperkalemia.  #Severe hyperkalemia on admission: Improved with CRRT.  K WNL off CRRT  #Acute liver injury presumably due to DILI (allopurinol ).  GI is following.  LFT's improving.  #Hyponatremia -likely related to hypervolemia.  Improved with lasix  #Edema -improved,  can give lasix PRN  #Acute kidney injury, nonoliguric: In the setting of acute liver failure likely HRS.  Started CRRT for anasarca, severe acidosis and hyperkalemia, stopped 12/4 given renal recovery. Temp line d/c'ed. Cr 0.7 today.  #Anion gap metabolic acidosis/lactic acidosis: Improved s/p CRRT, bicarb acceptable  #Hypophosphatemia: improved, replete prn  #Pancytopenia: With acute liver failure.  Monitor labs and evaluation ongoing per primary team.  Will sign off from a nephrology perspective. Please call with any questions/concerns. Can have labs checked with PCP and if any concerns can be referred to our office for outpatient follow up.  Anthony Sar, MD [facility]  Subjective: Seen and examined bedside this AM.  No acute events. Received lasix 40mg  iv x 1 dose yesterday, he reports that his swelling is essentially resolved, scrotal swelling is also better. Uop ~5.8L  Objective Vital signs in last 24 hours: Vitals:   03/14/21 1446 03/14/21 2012 03/15/21 0500 03/15/21 0803  BP: (!) 150/80 (!) 159/100 135/86 (!) 154/91  Pulse: (!) 104 99 (!) 110 (!) 108  Resp: 16 18 17 20   Temp: 98.4 F (36.9 C) 98.1 F (36.7 C) 98 F (36.7 C) 98.3 F (36.8 C)  TempSrc: Oral Oral Oral Oral  SpO2: 97% 99% 99% 98%  Weight:      Height:       Weight change:   Intake/Output Summary (Last 24 hours) at 03/15/2021 0946 Last data filed at  03/15/2021 0834 Gross per 24 hour  Intake 1060 ml  Output 5200 ml  Net -4140 ml       Labs: Basic Metabolic Panel: Recent Labs  Lab 03/13/21 0306 03/13/21 1600 03/14/21 0211 03/15/21 0049  NA 134* 135 129* 133*  K 4.3 4.6 4.1 4.3  CL 104 102 100 101  CO2 23 25 22  21*  GLUCOSE 128* 90 164* 183*  BUN 6 8 10 13   CREATININE 0.75 0.69 0.65 0.68  CALCIUM 8.7* 9.0 8.6* 9.2  PHOS 3.3 3.7  --  3.2   Liver Function Tests: Recent Labs  Lab 03/13/21 0306 03/13/21 1600 03/14/21 0211 03/15/21 0049  AST 330*  --  164* 111*  ALT 711*  --  462* 356*  ALKPHOS 91  --  74 92  BILITOT 2.3*  --  2.2* 1.8*  PROT 5.1*  --  5.0* 6.0*  ALBUMIN 2.7*  2.7* 2.8* 2.7* 3.0*   Recent Labs  Lab 03/09/21 1643  LIPASE 37   Recent Labs  Lab 03/09/21 1643  AMMONIA 64*   CBC: Recent Labs  Lab 03/10/21 0610 03/10/21 1701 03/11/21 1619 03/12/21 0313 03/13/21 0306 03/14/21 0211 03/15/21 0049  WBC 6.3   < > 2.0* 1.6* 2.1* 2.0* 2.3*  NEUTROABS 4.3  --   --   --   --  0.9* 1.4*  HGB 9.9*   < > 11.2* 10.3* 10.6* 10.4* 11.6*  HCT 32.5*   < > 33.6* 30.6* 32.0* 31.5* 36.3*  MCV 121.3*   < > 112.8*  112.1* 112.3* 112.1* 111.3*  PLT 174   < > 79* 67* 64* 58* 74*   < > = values in this interval not displayed.   Cardiac Enzymes: Recent Labs  Lab 03/09/21 1643  CKTOTAL 219   CBG: Recent Labs  Lab 03/11/21 1912 03/11/21 2311 03/12/21 0317 03/12/21 0717 03/12/21 1115  GLUCAP 148* 128* 107* 131* 115*    Iron Studies: No results for input(s): IRON, TIBC, TRANSFERRIN, FERRITIN in the last 72 hours. Studies/Results: No results found.  Medications: Infusions:    Scheduled Medications:  colchicine  0.6 mg Oral Daily   folic acid  1 mg Oral Daily   heparin injection (subcutaneous)  5,000 Units Subcutaneous Q8H   mouth rinse  15 mL Mouth Rinse BID   predniSONE  40 mg Oral Q breakfast   thiamine  100 mg Oral Daily    have reviewed scheduled and prn medications.  Physical  Exam: General: Lying flat in bed comfortable, nad Heart:RRR, s1s2 nl Lungs: Clear anteriorly, no wheezing or crackle Abdomen:soft, Non-tender, non-distended Extremities: trace edema b/l LEs Dialysis Access: Temporary IJ HD catheter  Tiny Rietz 03/15/2021,9:46 AM  LOS: 6 days

## 2021-03-15 NOTE — Discharge Summary (Addendum)
Name: Curtis Erickson MRN: [id number] DOB: [birth year] 30 y.o. PCP: Lynnea Ferrier, MD  Date of Admission: 03/09/2021  3:12 PM Date of Discharge: 03/15/2021 Attending Physician: Gust Rung, DO  Discharge Diagnosis: 1. 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 Acute liver failure 3. Acute renal failure, AKI 4. Recurrent gout 5. Alcohol abuse  Discharge Medications: Allergies as of 03/15/2021       Reactions   Allopurinol Other (See Comments)   Drug induced liver injury resulting in Liver/Kidney failure        Medication List     STOP taking these medications    allopurinol 100 MG tablet Commonly known as: ZYLOPRIM       TAKE these medications    colchicine 0.6 MG tablet Take 1 tablet (0.6 mg total) by mouth daily for 5 days. Start taking on: March 16, 2021   cyanocobalamin 1000 MCG tablet Take 1 tablet (1,000 mcg total) by mouth daily.   ferrous sulfate 325 (65 FE) MG tablet Take 1 tablet (325 mg total) by mouth 2 (two) times daily with a meal.   folic acid 1 MG tablet Commonly known as: FOLVITE Take 1 tablet (1 mg total) by mouth daily.   multivitamin with minerals Tabs tablet Take 1 tablet by mouth daily.   predniSONE 20 MG tablet Commonly known as: DELTASONE Take 2 tablets (40 mg total) by mouth daily with breakfast for 5 days. Start taking on: March 16, 2021   thiamine 100 MG tablet Take 1 tablet (100 mg total) by mouth daily.               Durable Medical Equipment  (From admission, onward)           Start     Ordered   03/15/21 1200  For home use only DME 4 wheeled rolling walker with seat  Once       Question:  Patient needs a walker to treat with the following condition  Answer:  Weakness   03/15/21 1159   03/15/21 1200  For home use only DME 3 n 1  Once        03/15/21 1159            Disposition and follow-up:   Mr.Curtis Erickson was discharged from [facility] in Stable  condition.  At the hospital follow up visit please address:  1.  Monitoring of hepatic function labs. Ensure rheumatology and Gi follow up.   2.  Labs / imaging needed at time of follow-up: as above  3.  Pending labs/ test needing follow-up: NA   Hospital Course by problem list:  1. Acute Liver failure (resolved) secondary to drug induced liver injury secondary to allopurinol Patient presented to the ED with complaints of nausea, vomiting, shortness of breath and lower extremity edema for approximately 1 month.  Initial lab work demonstrated severe liver injury with AST of 1528 and ALT of 918 with a total bilirubin of 5.3.  INR was elevated at 1.6. On day of admission, GI was consulted; acute liver injury suspected to be due to drug-induced liver injury in the setting of recent initiation of allopurinol approximately 2 months prior.  Due to uncertainty of potential unknown substance ingestion, acetylcysteine was initiated.  Critical care was consulted on admission due to refractory acidemia, severe hyperkalemia with EKG changes, and anasarca. He was transferred to the ICU for closer monitoring. The patient had acute renal failure (treated as below).  Liver fialure work-up including right upper quadrant ultrasound, hepatic Dopplers, Tylenol levels, salicylic acid levels, ANA, alpha 1 AT, IgG, IgM, IgA, hepatitis A IgM, hepatitis B, hepatitis C, CMV, EBV, HIV, and HSV were all negative. Liver transplant team at [facility] was consulted but they did not recommend transfer based on the patient's unchanged mentation. Per [facility] liver biopsy was not pursued. On discharge his INR had normalized, AST/ALT were trending down to 111/356, Tbili was 1.8. Platelets were 74 but trending up.   2. Acute renal failure Upon transfer to the ICU, nephrology was consulted and CRRT was initiated.  Patient required CRRT for approximately 48 hours with recovery of renal function. CRRT was discontinued on 12/4. Hospital course was  also complicated by septic shock secondary to pyelonephritis treated with 5 days of Rocephin. By discharge the patient had large urine output (likely post-ATN diuresis) as well as normal creatinine at 0.7.  3. Recurrent gout Persistently elevated uric acid since 2020 complicated gout (generally affecting the knees) and nephrolithiasis.  Most recent uric acid prior to admission in October 2022 elevated at 10.5.  Patient states previous knee arthrocentesis has detected crystals, however only arthrocentesis results I can find in the chart from 2021 demonstrate elevated WBC at 42,000 with 100% neutrophil and no crystals seen. Patient developed a moderately sized R knee swelling that was warm to touch during this admission. He was treated for a gout flare with prednisone and colchicine. He was discharged with these medications. He refused an arthrocentesis.    Discharge Exam:   BP (!) 154/91 (BP Location: Right Arm)   Pulse (!) 108   Temp 98.3 F (36.8 C) (Oral)   Resp 20   Ht 5\' 8"  (1.727 m)   Wt 104 kg   SpO2 98%   BMI 34.86 kg/m  Discharge exam:  Physical Exam Cardiovascular:     Rate and Rhythm: Normal rate and regular rhythm.     Pulses: Normal pulses.     Heart sounds: No murmur heard. Pulmonary:     Effort: Pulmonary effort is normal.     Breath sounds: Normal breath sounds.  Abdominal:     General: Bowel sounds are normal.     Palpations: Abdomen is soft.     Tenderness: There is no abdominal tenderness.  Musculoskeletal:     Comments: Swelling of R knee, warm to touch  Neurological:     Mental Status: He is alert.     Pertinent Labs, Studies, and Procedures:  As above  Discharge Instructions: Discharge Instructions     Call MD for:  difficulty breathing, headache or visual disturbances   Complete by: As directed    Call MD for:  persistant dizziness or light-headedness   Complete by: As directed    Call MD for:  persistant nausea and vomiting   Complete by: As  directed    Call MD for:  severe uncontrolled pain   Complete by: As directed    Call MD for:  temperature >100.4   Complete by: As directed    Discharge instructions   Complete by: As directed    Mr. Elzy, Curtis Erickson were admitted to the hospital after suffering a drug-induced liver injury due to Allopurinol. While here, you required dialysis temporarily while your kidneys recovered, as well as antibiotics for a kidney infection. We are so glad to see both your liver and kidneys are improving!   It will be very important to follow up with the GI doctors,  as well as your primary care doctor. Please ask your primary care doctor to refer you to a Rheumatologist for management of your gout.   Lastly, you were treated for a gout flare while here. Please continue Colchicine and Prednisone for an additional 5 days starting on 12/08. Avoiding alcohol will help your body clear uric acid, so please refrain from any alcohol use if possible.   It was a pleasure meeting you, and we wish you the best!   Sincerely,  Dr. Huel Cote   Increase activity slowly   Complete by: As directed        Signed: Carlyn Reichert, MD PGY-1

## 2021-03-15 NOTE — TOC Initial Note (Addendum)
Transition of Care [facility]) - Initial/Assessment Note    Patient Details  Name: Curtis Erickson MRN: [id number] Date of Birth: 02-May-1990  [facility]) CM/SW Contact:    Kingsley Plan, RN Phone Number: 03/15/2021, 11:39 AM  Clinical Narrative:                  See previous TOC note . [facility] Provided [facility] with [facility] contact information [phone] ext [phone].    Patient's current PCP is DR Daniel Nones in [place].  Consulted Artist.  Will assist with discharge prescriptions through [facility] and [facility] program  Patient lives with his girlfriend Grover Canavan who is a Lawyer and currently in nursing school.    PT messaged NCM to order Bariatric rollator  and bedside commode. Jackelyn Knife with [employer], patient's height and weight do not qualify him for Bariatric DME. Messaged MD and PT  Expected Discharge Plan: Home/Self Care Barriers to Discharge: Continued Medical Work up   Patient Goals and CMS Choice Patient states their goals for this hospitalization and ongoing recovery are:: to return to home CMS Medicare.gov Compare Post Acute Care list provided to:: Patient    Expected Discharge Plan and Services Expected Discharge Plan: Home/Self Care In-house Referral: Financial Counselor Discharge Planning Services: MATCH Program, Medication Assistance Post Acute Care Choice:  (TBD) Living arrangements for the past 2 months: Single Family Home                                      Prior Living Arrangements/Services Living arrangements for the past 2 months: Single Family Home Lives with:: Significant Other Patient language and need for interpreter reviewed:: Yes Do you feel safe going back to the place where you live?: Yes      Need for Family Participation in Patient Care: Yes (Comment) Care giver support system in place?: Yes (comment) Current home services: Other (comment) (none) Criminal  Activity/Legal Involvement Pertinent to Current Situation/Hospitalization: No - Comment as needed  Activities of Daily Living Home Assistive Devices/Equipment: None ADL Screening (condition at time of admission) Patient's cognitive ability adequate to safely complete daily activities?: Yes Is the patient deaf or have difficulty hearing?: No Does the patient have difficulty seeing, even when wearing glasses/contacts?: No Does the patient have difficulty concentrating, remembering, or making decisions?: No Patient able to express need for assistance with ADLs?: Yes Does the patient have difficulty dressing or bathing?: Yes Independently performs ADLs?: No Communication: Independent Dressing (OT): Needs assistance Is this a change from baseline?: Change from baseline, expected to last >3 days Grooming: Needs assistance Is this a change from baseline?: Change from baseline, expected to last >3 days Feeding: Independent Bathing: Needs assistance Is this a change from baseline?: Change from baseline, expected to last >3 days Toileting: Needs assistance Is this a change from baseline?: Change from baseline, expected to last >3days In/Out Bed: Needs assistance Is this a change from baseline?: Change from baseline, expected to last >3 days Walks in Home: Needs assistance Is this a change from baseline?: Change from baseline, expected to last >3 days Does the patient have difficulty walking or climbing stairs?: Yes Weakness of Legs: Both Weakness of Arms/Hands: Both  Permission Sought/Granted Permission sought to share information with : Family Supports Permission granted to share information with : No  Share Information with NAME: father Barbara Cower, Girlfriend Grover Canavan  Emotional Assessment Appearance:: Appears stated age Attitude/Demeanor/Rapport: Engaged Affect (typically observed): Accepting Orientation: : Oriented to Self, Oriented to Place, Oriented to  Time, Oriented to  Situation Alcohol / Substance Use: Not Applicable Psych Involvement: No (comment)  Admission diagnosis:  Liver injury [S36.119A] Hepatorenal failure (HCC) [K76.7] RUQ pain [R10.11] Acute liver failure [K72.00] Encounter for central line placement [Z45.2] Patient Active Problem List   Diagnosis Date Noted   Lactic acidosis 03/10/2021   Anasarca 03/10/2021   Liver injury 03/10/2021   Hepatorenal failure (HCC) 03/10/2021   Acute hepatitis 03/09/2021   Acute hypoxemic respiratory failure (HCC) 03/09/2021   Gout 03/09/2021   High anion gap metabolic acidosis 03/09/2021   Respiratory alkalosis 03/09/2021   Sinus tachycardia 03/09/2021   AKI (acute kidney injury) (HCC) 03/09/2021   Injury to liver    Severe sepsis without septic shock (CODE) (HCC) 09/19/2019   Acute pyelonephritis 09/19/2019   B12 deficiency 09/19/2019   Folate deficiency 09/19/2019   Dietary iron deficiency 09/19/2019   ETOH abuse 09/19/2019   Pancytopenia (HCC) 09/19/2019   Alcoholic fatty liver 09/19/2019   PCP:  Patient, [name] (Inactive) Pharmacy:   [id number] [place], [place] - [address] [place] [address] Phone: [phone] Fax: [phone]  [facility] [address] [place] [address] Phone: [phone] Fax: [phone]     Social Determinants of Health (SDOH) Interventions    Readmission Risk Interventions No flowsheet data found.

## 2022-02-23 IMAGING — US US HEPATIC LIVER DOPPLER
1 series · 14 of 25 positions shown · non-contrast
Comparison: 03/09/2021

CLINICAL DATA: Acute liver failure

EXAM:
DUPLEX ULTRASOUND OF LIVER
TECHNIQUE: Color and duplex Doppler ultrasound was performed to evaluate the
hepatic in-flow and out-flow vessels.

[Series 1: us liver doppler · 14 of 28 slices shown]
[im 1/28]
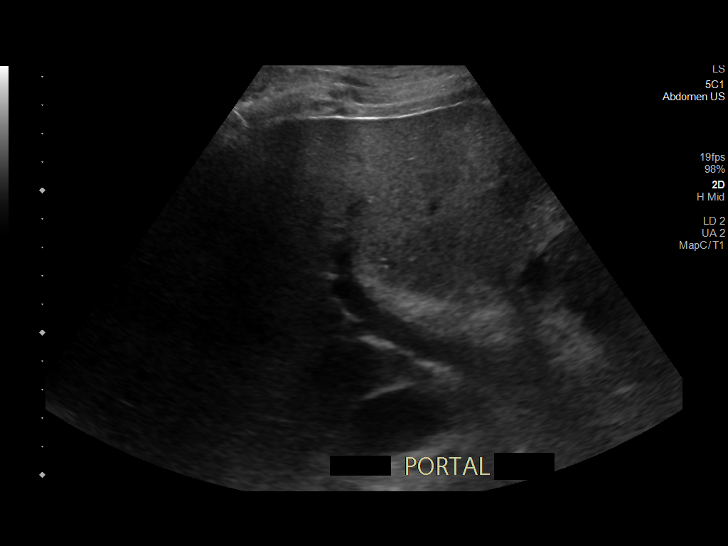
[im 3/28]
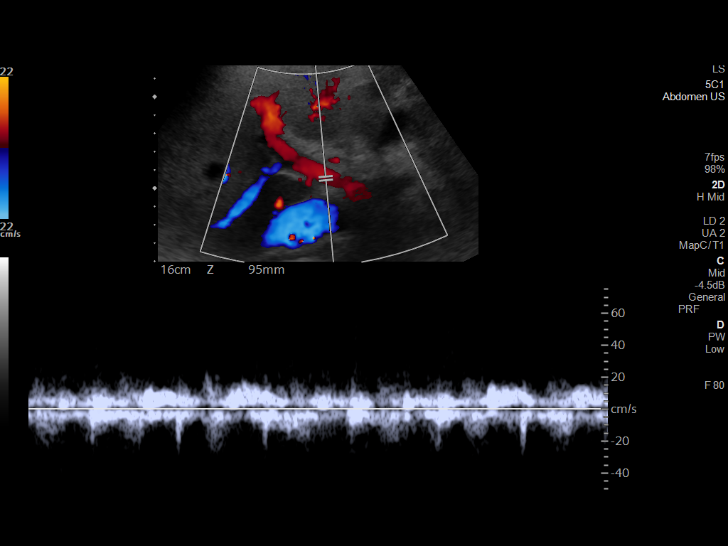
[im 5/28]
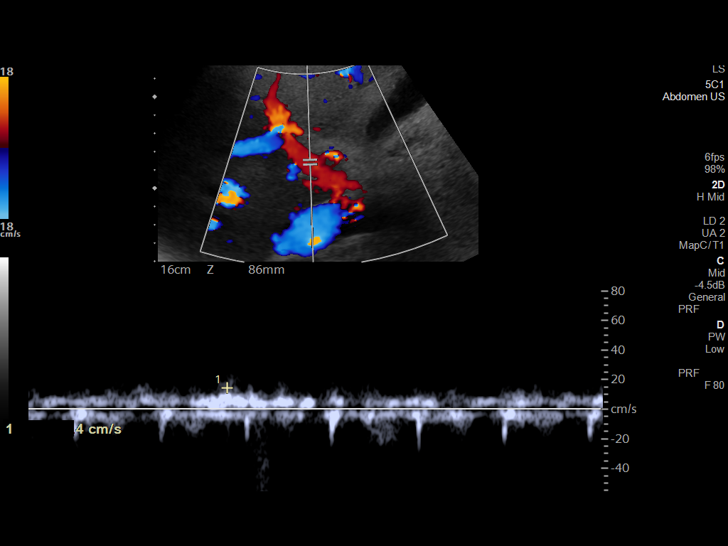
[im 7/28]
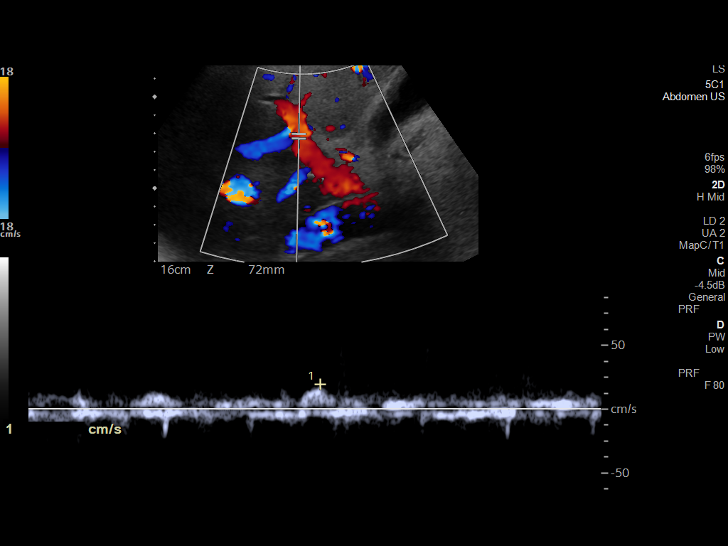
[im 10/28]
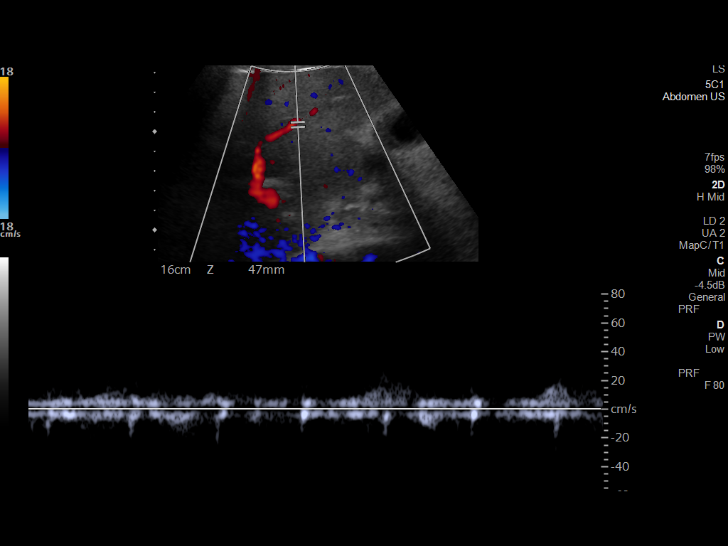
[im 11/28]
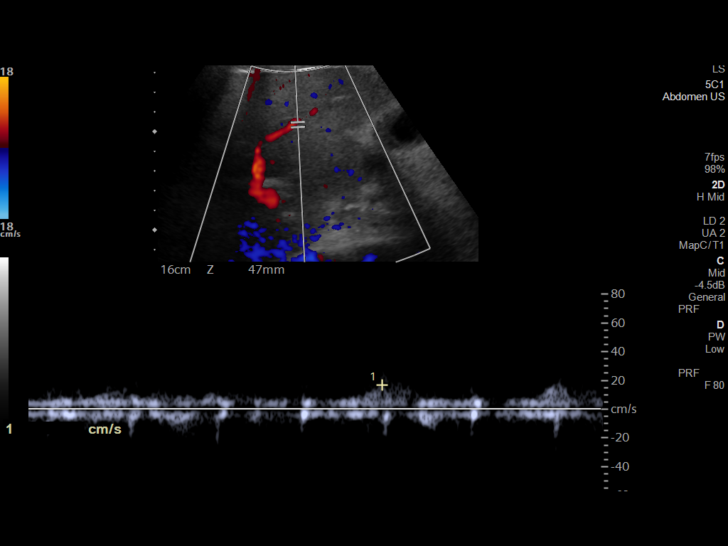
[im 13/28]
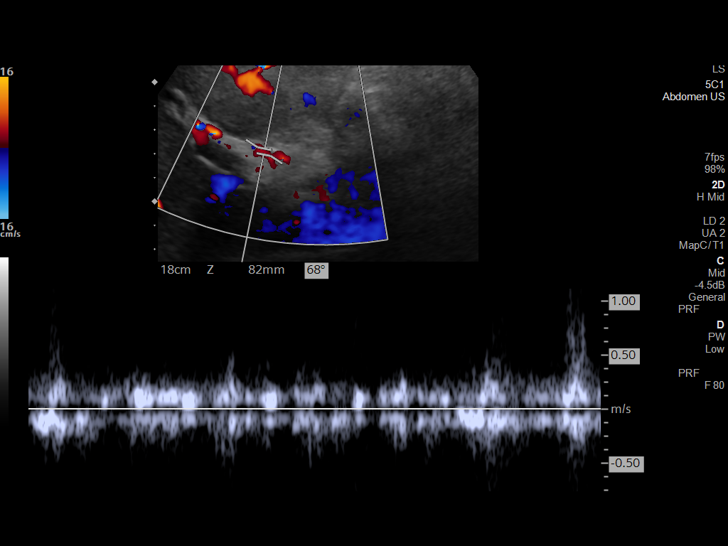
[im 15/28]
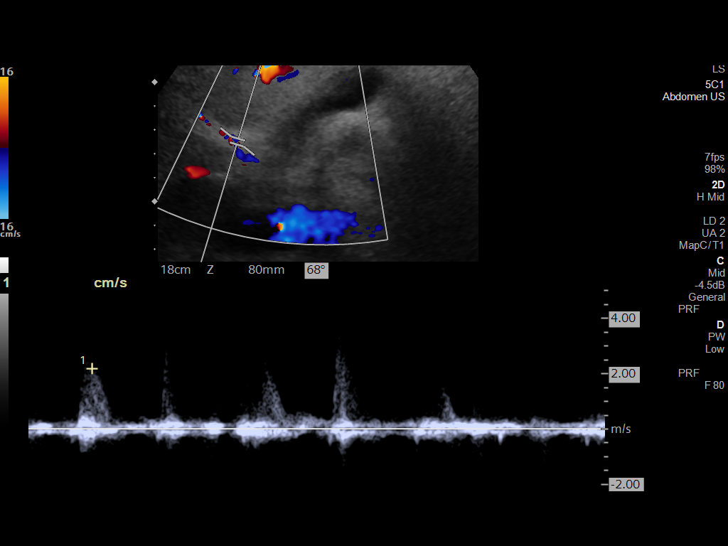
[im 17/28]
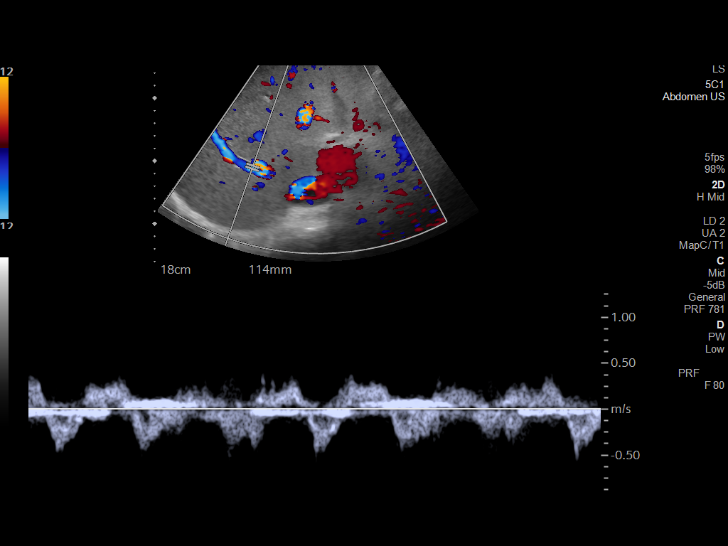
[im 19/28]
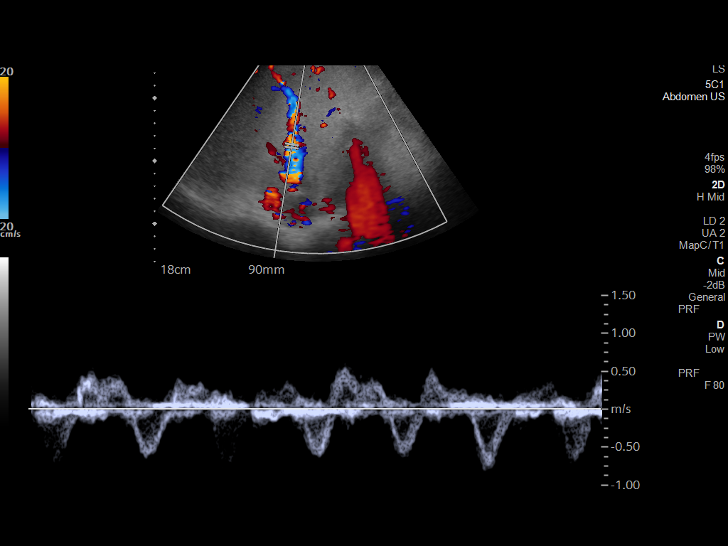
[im 21/28]
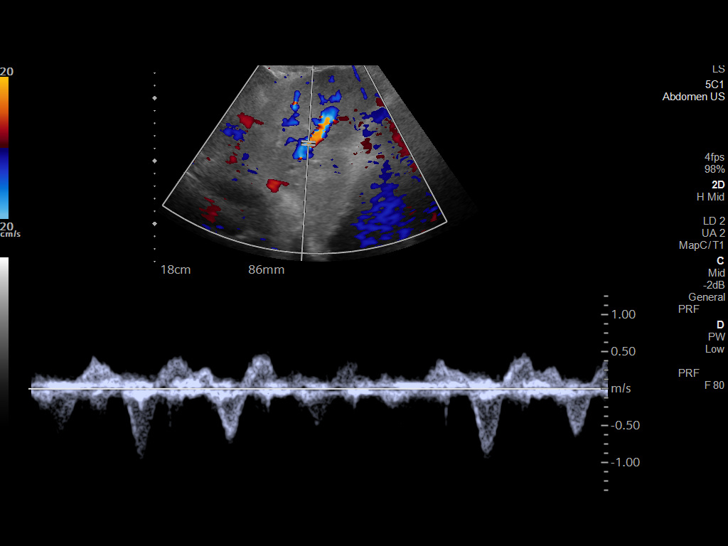
[im 23/28]
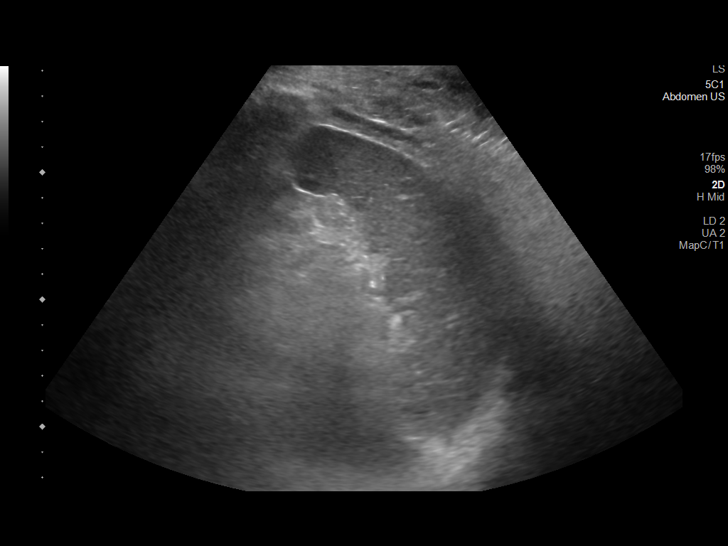
[im 25/28]
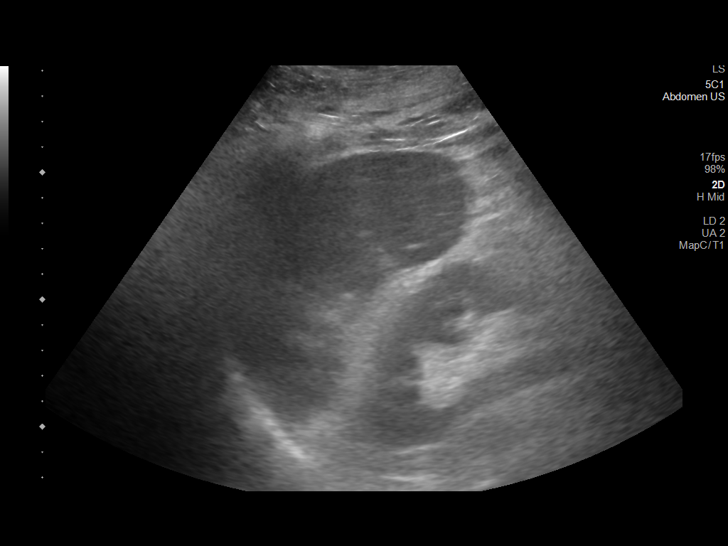
[im 28/28]
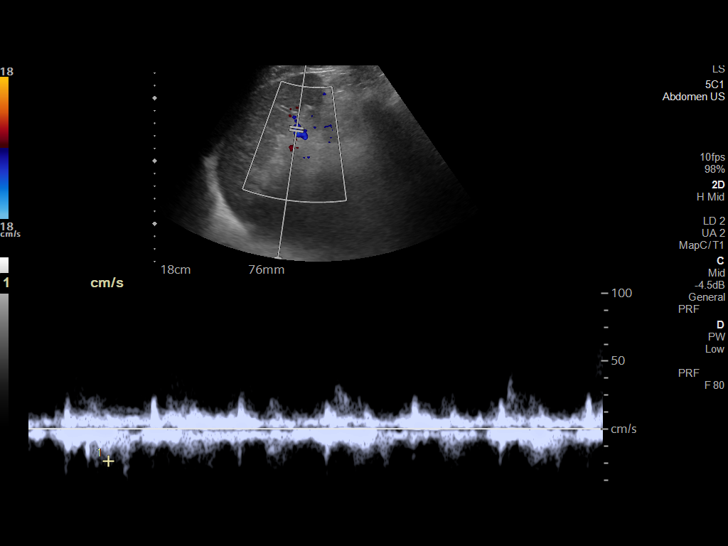

[14 of 25 positions shown; findings below may reference images not displayed]

FINDINGS: Liver: Mildly heterogeneous liver. Normal hepatic contour without
nodularity.

No focal lesion, mass or intrahepatic biliary ductal dilatation.

Main Portal Vein size: 0.9 cm

Portal Vein Velocities

Main Prox:  20 cm/sec

Main Mid: 14 cm/sec

Main Dist:  19 cm/sec
Right: 13 cm/sec
Left: 16 cm/sec

Hepatic Vein Velocities

Right:  44 cm/sec

Middle:  62 cm/sec

Left:  72 cm/sec

IVC: Present and patent with normal respiratory phasicity.

Hepatic Artery Velocity:  218 cm/sec

Splenic Vein Velocity:  23 cm/sec

Spleen: 13 cm x 3.6 cm x 4.9 cm with a total volume of 121 cm^3
(411 cm^3 is upper limit normal)

Portal Vein Occlusion/Thrombus: No

Splenic Vein Occlusion/Thrombus: No

Ascites: None

Varices: None

Negative for portal vein occlusion or thrombus. Limited exam because
of patient's condition.
IMPRESSION: Patent portal, hepatic and splenic veins with normal directional
flow.

## 2022-02-24 IMAGING — DX DG CHEST 1V PORT
1 series · 1 of 1 positions shown · non-contrast
Comparison: [DATE] [DATE], [DATE] ([DATE] p.m.)

CLINICAL DATA: Central line placement.

EXAM:
PORTABLE CHEST 1 VIEW

[chest ap]
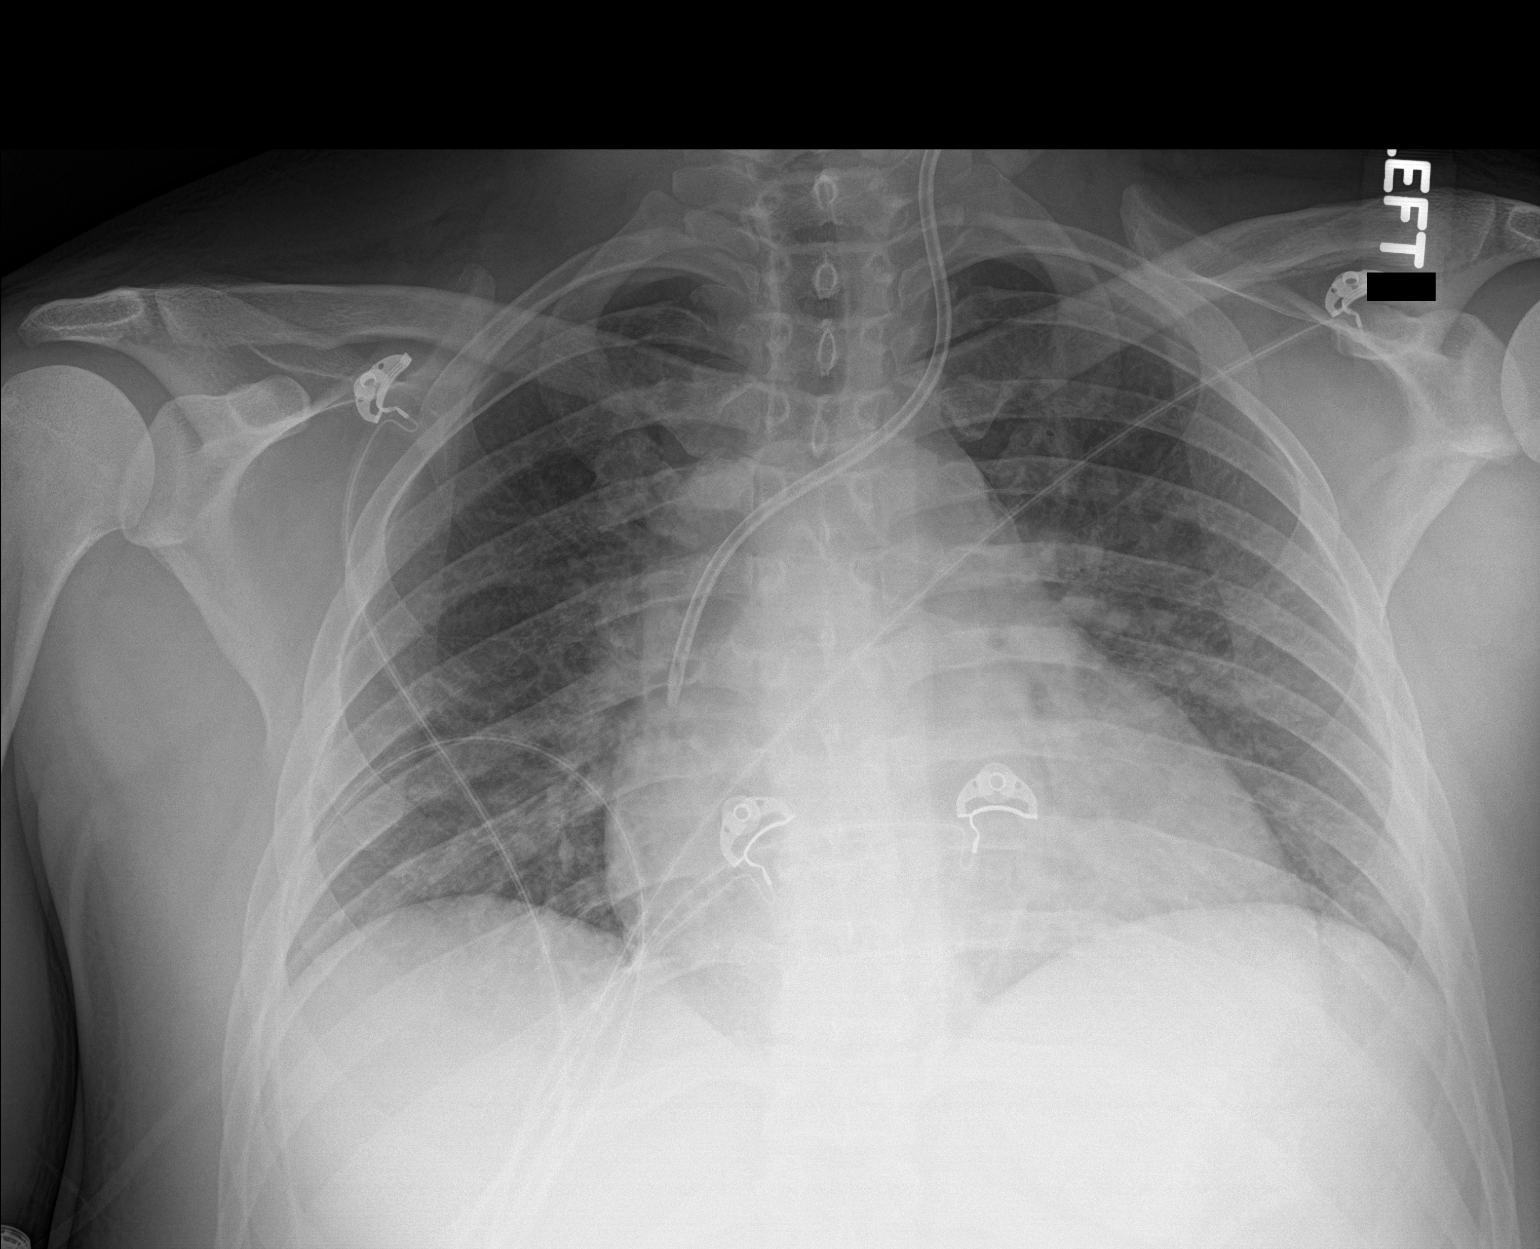

[1 of 1 positions shown; findings below may reference images not displayed]

FINDINGS: Interval left internal jugular venous catheter placement is noted
with its distal tip seen at the junction of the superior vena cava
and right atrium. Decreased lung volumes are seen without evidence
of acute infiltrate, pleural effusion or pneumothorax. The heart
size and mediastinal contours are within normal limits. The
visualized skeletal structures are unremarkable.
IMPRESSION: Interval left internal jugular venous catheter placement
positioning, as described above.

## 2022-03-31 ENCOUNTER — Inpatient Hospital Stay
Admission: EM | Admit: 2022-03-31 | Discharge: 2022-04-07 | DRG: 208 | Disposition: A | Discharge status: Home / Self Care | Payer: Self-pay | Attending: Internal Medicine | Admitting: Internal Medicine

## 2022-03-31 ENCOUNTER — Inpatient Hospital Stay: Payer: Self-pay

## 2022-03-31 ENCOUNTER — Other Ambulatory Visit: Payer: Self-pay

## 2022-03-31 ENCOUNTER — Emergency Department: Payer: Self-pay

## 2022-03-31 ENCOUNTER — Encounter: Payer: Self-pay | Admitting: Emergency Medicine

## 2022-03-31 DIAGNOSIS — E538 Deficiency of other specified B group vitamins: Secondary | ICD-10-CM | POA: Diagnosis present

## 2022-03-31 DIAGNOSIS — Z6830 Body mass index (BMI) 30.0-30.9, adult: Secondary | ICD-10-CM

## 2022-03-31 DIAGNOSIS — E1165 Type 2 diabetes mellitus with hyperglycemia: Secondary | ICD-10-CM | POA: Diagnosis present

## 2022-03-31 DIAGNOSIS — R809 Proteinuria, unspecified: Secondary | ICD-10-CM | POA: Diagnosis present

## 2022-03-31 DIAGNOSIS — I10 Essential (primary) hypertension: Secondary | ICD-10-CM | POA: Diagnosis present

## 2022-03-31 DIAGNOSIS — R319 Hematuria, unspecified: Secondary | ICD-10-CM | POA: Diagnosis not present

## 2022-03-31 DIAGNOSIS — J101 Influenza due to other identified influenza virus with other respiratory manifestations: Secondary | ICD-10-CM

## 2022-03-31 DIAGNOSIS — G928 Other toxic encephalopathy: Secondary | ICD-10-CM | POA: Diagnosis present

## 2022-03-31 DIAGNOSIS — R042 Hemoptysis: Secondary | ICD-10-CM | POA: Diagnosis present

## 2022-03-31 DIAGNOSIS — J1001 Influenza due to other identified influenza virus with the same other identified influenza virus pneumonia: Principal | ICD-10-CM | POA: Diagnosis present

## 2022-03-31 DIAGNOSIS — Z87442 Personal history of urinary calculi: Secondary | ICD-10-CM

## 2022-03-31 DIAGNOSIS — E669 Obesity, unspecified: Secondary | ICD-10-CM | POA: Diagnosis present

## 2022-03-31 DIAGNOSIS — J80 Acute respiratory distress syndrome: Principal | ICD-10-CM | POA: Diagnosis present

## 2022-03-31 DIAGNOSIS — E872 Acidosis, unspecified: Secondary | ICD-10-CM | POA: Diagnosis present

## 2022-03-31 DIAGNOSIS — K700 Alcoholic fatty liver: Secondary | ICD-10-CM | POA: Diagnosis present

## 2022-03-31 DIAGNOSIS — T504X5A Adverse effect of drugs affecting uric acid metabolism, initial encounter: Secondary | ICD-10-CM | POA: Diagnosis present

## 2022-03-31 DIAGNOSIS — M109 Gout, unspecified: Secondary | ICD-10-CM | POA: Diagnosis present

## 2022-03-31 DIAGNOSIS — J811 Chronic pulmonary edema: Secondary | ICD-10-CM | POA: Diagnosis present

## 2022-03-31 DIAGNOSIS — K7460 Unspecified cirrhosis of liver: Secondary | ICD-10-CM | POA: Diagnosis present

## 2022-03-31 DIAGNOSIS — D509 Iron deficiency anemia, unspecified: Secondary | ICD-10-CM | POA: Diagnosis present

## 2022-03-31 DIAGNOSIS — D61818 Other pancytopenia: Secondary | ICD-10-CM | POA: Diagnosis present

## 2022-03-31 DIAGNOSIS — U070 Vaping-related disorder: Secondary | ICD-10-CM | POA: Diagnosis present

## 2022-03-31 DIAGNOSIS — J9691 Respiratory failure, unspecified with hypoxia: Secondary | ICD-10-CM | POA: Diagnosis present

## 2022-03-31 DIAGNOSIS — K719 Toxic liver disease, unspecified: Secondary | ICD-10-CM | POA: Diagnosis present

## 2022-03-31 DIAGNOSIS — J189 Pneumonia, unspecified organism: Secondary | ICD-10-CM

## 2022-03-31 DIAGNOSIS — Z888 Allergy status to other drugs, medicaments and biological substances status: Secondary | ICD-10-CM

## 2022-03-31 DIAGNOSIS — Z1152 Encounter for screening for COVID-19: Secondary | ICD-10-CM

## 2022-03-31 DIAGNOSIS — J159 Unspecified bacterial pneumonia: Secondary | ICD-10-CM | POA: Diagnosis present

## 2022-03-31 DIAGNOSIS — N179 Acute kidney failure, unspecified: Secondary | ICD-10-CM | POA: Diagnosis not present

## 2022-03-31 DIAGNOSIS — J9601 Acute respiratory failure with hypoxia: Secondary | ICD-10-CM | POA: Diagnosis present

## 2022-03-31 DIAGNOSIS — Z79899 Other long term (current) drug therapy: Secondary | ICD-10-CM

## 2022-03-31 DIAGNOSIS — G9341 Metabolic encephalopathy: Secondary | ICD-10-CM

## 2022-03-31 LAB — URINALYSIS, COMPLETE (UACMP) WITH MICROSCOPIC
Bilirubin Urine: NEGATIVE
Glucose, UA: NEGATIVE mg/dL
Ketones, ur: NEGATIVE mg/dL
Leukocytes,Ua: NEGATIVE
Nitrite: NEGATIVE
Protein, ur: >=300 mg/dL — AB
Specific Gravity, Urine: 1.018 (ref 1.005–1.030)
Squamous Epithelial / HPF: NONE SEEN (ref 0–5)
pH: 5 (ref 5.0–8.0)

## 2022-03-31 LAB — GLUCOSE, CAPILLARY
Glucose-Capillary: 153 mg/dL — ABNORMAL HIGH (ref 70–99)
Glucose-Capillary: 132 mg/dL — ABNORMAL HIGH (ref 70–99)
Glucose-Capillary: 165 mg/dL — ABNORMAL HIGH (ref 70–99)
Glucose-Capillary: 172 mg/dL — ABNORMAL HIGH (ref 70–99)
Glucose-Capillary: 167 mg/dL — ABNORMAL HIGH (ref 70–99)

## 2022-03-31 LAB — BLOOD GAS, ARTERIAL
Acid-base deficit: 3.6 mmol/L — ABNORMAL HIGH (ref 0.0–2.0)
Acid-base deficit: 4.8 mmol/L — ABNORMAL HIGH (ref 0.0–2.0)
Bicarbonate: 20.6 mmol/L (ref 20.0–28.0)
Bicarbonate: 23.7 mmol/L (ref 20.0–28.0)
Delivery systems: POSITIVE
Expiratory PAP: 5 cmH2O
FIO2: 50 %
FIO2: 100 %
Inspiratory PAP: 10 cmH2O
MECHVT: 400 mL
Mechanical Rate: 20
O2 Saturation: 97.4 %
O2 Saturation: 100 %
PEEP: 10 cmH2O
Patient temperature: 39.4
Patient temperature: 36.9
RATE: 10 resp/min
pCO2 arterial: 38 mmHg (ref 32–48)
pCO2 arterial: 58 mmHg — ABNORMAL HIGH (ref 32–48)
pH, Arterial: 7.35 (ref 7.35–7.45)
pH, Arterial: 7.22 — ABNORMAL LOW (ref 7.35–7.45)
pO2, Arterial: 98 mmHg (ref 83–108)
pO2, Arterial: 176 mmHg — ABNORMAL HIGH (ref 83–108)

## 2022-03-31 LAB — BASIC METABOLIC PANEL
Anion gap: 8 (ref 5–15)
Anion gap: 11 (ref 5–15)
Anion gap: 17 — ABNORMAL HIGH (ref 5–15)
BUN: 13 mg/dL (ref 6–20)
BUN: 13 mg/dL (ref 6–20)
BUN: 18 mg/dL (ref 6–20)
CO2: 23 mmol/L (ref 22–32)
CO2: 22 mmol/L (ref 22–32)
CO2: 16 mmol/L — ABNORMAL LOW (ref 22–32)
Calcium: 8.1 mg/dL — ABNORMAL LOW (ref 8.9–10.3)
Calcium: 8.4 mg/dL — ABNORMAL LOW (ref 8.9–10.3)
Calcium: 8.2 mg/dL — ABNORMAL LOW (ref 8.9–10.3)
Chloride: 101 mmol/L (ref 98–111)
Chloride: 101 mmol/L (ref 98–111)
Chloride: 102 mmol/L (ref 98–111)
Creatinine, Ser: 0.76 mg/dL (ref 0.61–1.24)
Creatinine, Ser: 0.67 mg/dL (ref 0.61–1.24)
Creatinine, Ser: 1.08 mg/dL (ref 0.61–1.24)
GFR, Estimated: >60 mL/min (ref >60)
GFR, Estimated: >60 mL/min (ref >60)
GFR, Estimated: >60 mL/min (ref >60)
Glucose, Bld: 149 mg/dL — ABNORMAL HIGH (ref 70–99)
Glucose, Bld: 145 mg/dL — ABNORMAL HIGH (ref 70–99)
Glucose, Bld: 165 mg/dL — ABNORMAL HIGH (ref 70–99)
Potassium: 5.7 mmol/L — ABNORMAL HIGH (ref 3.5–5.1)
Potassium: 4.7 mmol/L (ref 3.5–5.1)
Potassium: 4.3 mmol/L (ref 3.5–5.1)
Sodium: 132 mmol/L — ABNORMAL LOW (ref 135–145)
Sodium: 134 mmol/L — ABNORMAL LOW (ref 135–145)
Sodium: 135 mmol/L (ref 135–145)

## 2022-03-31 LAB — CBC WITH DIFFERENTIAL/PLATELET
Abs Immature Granulocytes: 0.01 10*3/uL (ref 0.00–0.07)
Basophils Absolute: 0 10*3/uL (ref 0.0–0.1)
Basophils Relative: 2 %
Eosinophils Absolute: 0 10*3/uL (ref 0.0–0.5)
Eosinophils Relative: 0 %
HCT: 36.9 % — ABNORMAL LOW (ref 39.0–52.0)
Hemoglobin: 12.2 g/dL — ABNORMAL LOW (ref 13.0–17.0)
Immature Granulocytes: 0 %
Lymphocytes Relative: 46 %
Lymphs Abs: 1.1 10*3/uL (ref 0.7–4.0)
MCH: 36.2 pg — ABNORMAL HIGH (ref 26.0–34.0)
MCHC: 33.1 g/dL (ref 30.0–36.0)
MCV: 109.5 fL — ABNORMAL HIGH (ref 80.0–100.0)
Monocytes Absolute: 0.2 10*3/uL (ref 0.1–1.0)
Monocytes Relative: 7 %
Neutro Abs: 1 10*3/uL — ABNORMAL LOW (ref 1.7–7.7)
Neutrophils Relative %: 45 %
Platelets: 138 10*3/uL — ABNORMAL LOW (ref 150–400)
RBC: 3.37 MIL/uL — ABNORMAL LOW (ref 4.22–5.81)
RDW: 15.2 % (ref 11.5–15.5)
WBC: 2.3 10*3/uL — ABNORMAL LOW (ref 4.0–10.5)
nRBC: 0 % (ref 0.0–0.2)

## 2022-03-31 LAB — URINE DRUG SCREEN, QUALITATIVE (ARMC ONLY)
Amphetamines, Ur Screen: NOT DETECTED
Barbiturates, Ur Screen: NOT DETECTED
Benzodiazepine, Ur Scrn: NOT DETECTED
Cannabinoid 50 Ng, Ur ~~LOC~~: NOT DETECTED
Cocaine Metabolite,Ur ~~LOC~~: NOT DETECTED
MDMA (Ecstasy)Ur Screen: NOT DETECTED
Methadone Scn, Ur: NOT DETECTED
Opiate, Ur Screen: POSITIVE — AB
Phencyclidine (PCP) Ur S: NOT DETECTED
Tricyclic, Ur Screen: NOT DETECTED

## 2022-03-31 LAB — CBC
HCT: 32.6 % — ABNORMAL LOW (ref 39.0–52.0)
Hemoglobin: 11 g/dL — ABNORMAL LOW (ref 13.0–17.0)
MCH: 36.5 pg — ABNORMAL HIGH (ref 26.0–34.0)
MCHC: 33.7 g/dL (ref 30.0–36.0)
MCV: 108.3 fL — ABNORMAL HIGH (ref 80.0–100.0)
Platelets: 88 10*3/uL — ABNORMAL LOW (ref 150–400)
RBC: 3.01 MIL/uL — ABNORMAL LOW (ref 4.22–5.81)
RDW: 15 % (ref 11.5–15.5)
WBC: 1.6 10*3/uL — ABNORMAL LOW (ref 4.0–10.5)
nRBC: 0 % (ref 0.0–0.2)

## 2022-03-31 LAB — HEPATIC FUNCTION PANEL
ALT: 19 U/L (ref 0–44)
AST: 35 U/L (ref 15–41)
Albumin: 3.6 g/dL (ref 3.5–5.0)
Alkaline Phosphatase: 62 U/L (ref 38–126)
Bilirubin, Direct: 0.8 mg/dL — ABNORMAL HIGH (ref 0.0–0.2)
Indirect Bilirubin: 1.4 mg/dL — ABNORMAL HIGH (ref 0.3–0.9)
Total Bilirubin: 2.2 mg/dL — ABNORMAL HIGH (ref 0.3–1.2)
Total Protein: 6.8 g/dL (ref 6.5–8.1)

## 2022-03-31 LAB — VITAMIN B12: Vitamin B-12: 124 pg/mL — ABNORMAL LOW (ref 180–914)

## 2022-03-31 LAB — MAGNESIUM
Magnesium: 1.4 mg/dL — ABNORMAL LOW (ref 1.7–2.4)
Magnesium: 1.5 mg/dL — ABNORMAL LOW (ref 1.7–2.4)
Magnesium: 2.3 mg/dL (ref 1.7–2.4)

## 2022-03-31 LAB — PROTIME-INR
INR: 1.6 — ABNORMAL HIGH (ref 0.8–1.2)
Prothrombin Time: 18.5 seconds — ABNORMAL HIGH (ref 11.4–15.2)

## 2022-03-31 LAB — T4, FREE: Free T4: 0.78 ng/dL (ref 0.61–1.12)

## 2022-03-31 LAB — PHOSPHORUS
Phosphorus: 5.3 mg/dL — ABNORMAL HIGH (ref 2.5–4.6)
Phosphorus: 4.8 mg/dL — ABNORMAL HIGH (ref 2.5–4.6)
Phosphorus: 6.1 mg/dL — ABNORMAL HIGH (ref 2.5–4.6)

## 2022-03-31 LAB — HIV ANTIBODY (ROUTINE TESTING W REFLEX): HIV Screen 4th Generation wRfx: NONREACTIVE

## 2022-03-31 LAB — COMPREHENSIVE METABOLIC PANEL
ALT: 19 U/L (ref 0–44)
AST: 40 U/L (ref 15–41)
Albumin: 3.9 g/dL (ref 3.5–5.0)
Alkaline Phosphatase: 76 U/L (ref 38–126)
Anion gap: 17 — ABNORMAL HIGH (ref 5–15)
BUN: 9 mg/dL (ref 6–20)
CO2: 16 mmol/L — ABNORMAL LOW (ref 22–32)
Calcium: 9 mg/dL (ref 8.9–10.3)
Chloride: 101 mmol/L (ref 98–111)
Creatinine, Ser: 0.77 mg/dL (ref 0.61–1.24)
GFR, Estimated: >60 mL/min (ref >60)
Glucose, Bld: 107 mg/dL — ABNORMAL HIGH (ref 70–99)
Potassium: 3.9 mmol/L (ref 3.5–5.1)
Sodium: 134 mmol/L — ABNORMAL LOW (ref 135–145)
Total Bilirubin: 2.5 mg/dL — ABNORMAL HIGH (ref 0.3–1.2)
Total Protein: 7.3 g/dL (ref 6.5–8.1)

## 2022-03-31 LAB — BLOOD GAS, VENOUS
Acid-base deficit: 1.4 mmol/L (ref 0.0–2.0)
Bicarbonate: 26.3 mmol/L (ref 20.0–28.0)
O2 Saturation: 93 %
Patient temperature: 37
pCO2, Ven: 56 mmHg (ref 44–60)
pH, Ven: 7.28 (ref 7.25–7.43)
pO2, Ven: 78 mmHg — ABNORMAL HIGH (ref 32–45)

## 2022-03-31 LAB — TROPONIN I (HIGH SENSITIVITY)
Troponin I (High Sensitivity): 30 ng/L — ABNORMAL HIGH (ref <18)
Troponin I (High Sensitivity): 55 ng/L — ABNORMAL HIGH (ref <18)

## 2022-03-31 LAB — LACTIC ACID, PLASMA
Lactic Acid, Venous: 7.1 mmol/L (ref 0.5–1.9)
Lactic Acid, Venous: 4 mmol/L (ref 0.5–1.9)

## 2022-03-31 LAB — D-DIMER, QUANTITATIVE: D-Dimer, Quant: 2.81 ug/mL-FEU — ABNORMAL HIGH (ref 0.00–0.50)

## 2022-03-31 LAB — APTT: aPTT: 37 seconds — ABNORMAL HIGH (ref 24–36)

## 2022-03-31 LAB — MRSA NEXT GEN BY PCR, NASAL: MRSA by PCR Next Gen: NOT DETECTED

## 2022-03-31 LAB — BRAIN NATRIURETIC PEPTIDE: B Natriuretic Peptide: 573.6 pg/mL — ABNORMAL HIGH (ref 0.0–100.0)

## 2022-03-31 LAB — URIC ACID: Uric Acid, Serum: 6.6 mg/dL (ref 3.7–8.6)

## 2022-03-31 LAB — RESP PANEL BY RT-PCR (RSV, FLU A&B, COVID)  RVPGX2
Influenza A by PCR: POSITIVE — AB
Influenza B by PCR: NEGATIVE
Resp Syncytial Virus by PCR: NEGATIVE
SARS Coronavirus 2 by RT PCR: NEGATIVE

## 2022-03-31 LAB — STREP PNEUMONIAE URINARY ANTIGEN: Strep Pneumo Urinary Antigen: NEGATIVE

## 2022-03-31 LAB — PROCALCITONIN: Procalcitonin: 0.15 ng/mL

## 2022-03-31 LAB — TSH: TSH: 2.289 u[IU]/mL (ref 0.350–4.500)

## 2022-03-31 LAB — FOLATE: Folate: 2.2 ng/mL — ABNORMAL LOW (ref >5.9)

## 2022-03-31 MED ORDER — ROCURONIUM BROMIDE 10 MG/ML (PF) SYRINGE
PREFILLED_SYRINGE | INTRAVENOUS | Status: AC
  Filled 2022-03-31: qty 10

## 2022-03-31 MED ORDER — ROCURONIUM BROMIDE 50 MG/5ML IV SOLN
100.0000 mg | Freq: Once | INTRAVENOUS | Status: AC
  Filled 2022-03-31: qty 10

## 2022-03-31 MED ORDER — HEPARIN SODIUM (PORCINE) 5000 UNIT/ML IJ SOLN
5000.0000 [IU] | Freq: Three times a day (TID) | INTRAMUSCULAR | Status: DC
  Administered 2022-03-31 – 2022-04-07 (×23): 5000 [IU] via SUBCUTANEOUS
  Filled 2022-03-31 (×23): qty 1

## 2022-03-31 MED ORDER — NOREPINEPHRINE 4 MG/250ML-% IV SOLN
2.0000 ug/min | INTRAVENOUS | Status: DC
  Administered 2022-03-31: 2 ug/min via INTRAVENOUS
  Filled 2022-03-31: qty 250

## 2022-03-31 MED ORDER — PROPOFOL 1000 MG/100ML IV EMUL
5.0000 ug/kg/min | INTRAVENOUS | Status: DC
  Administered 2022-03-31: 40 ug/kg/min via INTRAVENOUS
  Administered 2022-03-31: 50.094 ug/kg/min via INTRAVENOUS
  Administered 2022-03-31 (×2): 30 ug/kg/min via INTRAVENOUS
  Administered 2022-03-31: 50 ug/kg/min via INTRAVENOUS
  Administered 2022-03-31: 5 ug/kg/min via INTRAVENOUS
  Administered 2022-04-01 (×2): 40 ug/kg/min via INTRAVENOUS
  Filled 2022-03-31 (×7): qty 100

## 2022-03-31 MED ORDER — OSELTAMIVIR PHOSPHATE 75 MG PO CAPS
75.0000 mg | ORAL_CAPSULE | Freq: Two times a day (BID) | ORAL | Status: AC
  Administered 2022-03-31 – 2022-04-05 (×10): 75 mg
  Filled 2022-03-31 (×10): qty 1

## 2022-03-31 MED ORDER — PROSOURCE TF20 ENFIT COMPATIBL EN LIQD
60.0000 mL | Freq: Every day | ENTERAL | Status: DC
  Administered 2022-03-31 – 2022-04-01 (×2): 60 mL

## 2022-03-31 MED ORDER — FENTANYL BOLUS VIA INFUSION: 50.0000 ug | INTRAVENOUS | Status: DC | PRN

## 2022-03-31 MED ORDER — BUDESONIDE 0.25 MG/2ML IN SUSP
0.2500 mg | Freq: Two times a day (BID) | RESPIRATORY_TRACT | Status: DC
  Administered 2022-03-31 – 2022-04-01 (×3): 0.25 mg via RESPIRATORY_TRACT
  Filled 2022-03-31 (×3): qty 2

## 2022-03-31 MED ORDER — SODIUM CHLORIDE 0.9 % IV SOLN
400.0000 mg | Freq: Once | INTRAVENOUS | Status: DC
  Filled 2022-03-31: qty 100

## 2022-03-31 MED ORDER — LORAZEPAM 2 MG/ML IJ SOLN
2.0000 mg | Freq: Once | INTRAMUSCULAR | Status: AC
  Administered 2022-03-31: 2 mg via INTRAVENOUS
  Filled 2022-03-31: qty 1

## 2022-03-31 MED ORDER — SODIUM CHLORIDE 0.9 % IV SOLN: 400.0000 mg | Freq: Once | INTRAVENOUS | Status: DC

## 2022-03-31 MED ORDER — HYDROCORTISONE SOD SUC (PF) 100 MG IJ SOLR
50.0000 mg | Freq: Four times a day (QID) | INTRAMUSCULAR | Status: DC
  Administered 2022-03-31 – 2022-04-01 (×3): 50 mg via INTRAVENOUS
  Filled 2022-03-31 (×3): qty 2

## 2022-03-31 MED ORDER — INSULIN ASPART 100 UNIT/ML IJ SOLN
0.0000 [IU] | INTRAMUSCULAR | Status: DC
  Administered 2022-03-31 – 2022-04-01 (×4): 2 [IU] via SUBCUTANEOUS
  Administered 2022-04-01: 1 [IU] via SUBCUTANEOUS
  Administered 2022-04-01 (×4): 2 [IU] via SUBCUTANEOUS
  Administered 2022-04-02: 1 [IU] via SUBCUTANEOUS
  Administered 2022-04-02: 3 [IU] via SUBCUTANEOUS
  Administered 2022-04-02: 1 [IU] via SUBCUTANEOUS
  Administered 2022-04-02 – 2022-04-03 (×4): 2 [IU] via SUBCUTANEOUS
  Administered 2022-04-03: 1 [IU] via SUBCUTANEOUS
  Administered 2022-04-03: 2 [IU] via SUBCUTANEOUS
  Filled 2022-03-31 (×17): qty 1

## 2022-03-31 MED ORDER — SODIUM CHLORIDE 0.9 % IV SOLN
1.0000 g | INTRAVENOUS | Status: DC
  Administered 2022-04-01 – 2022-04-03 (×3): 1 g via INTRAVENOUS
  Filled 2022-03-31 (×2): qty 1
  Filled 2022-03-31: qty 10

## 2022-03-31 MED ORDER — SODIUM CHLORIDE 0.9 % IV SOLN
500.0000 mg | INTRAVENOUS | Status: DC
  Administered 2022-04-01 – 2022-04-06 (×6): 500 mg via INTRAVENOUS
  Filled 2022-03-31: qty 500
  Filled 2022-03-31: qty 5
  Filled 2022-03-31: qty 500
  Filled 2022-03-31: qty 5
  Filled 2022-03-31 (×2): qty 500

## 2022-03-31 MED ORDER — SODIUM CHLORIDE 0.9 % IV SOLN
2.0000 g | Freq: Once | INTRAVENOUS | Status: AC
  Administered 2022-03-31: 2 g via INTRAVENOUS
  Filled 2022-03-31: qty 20

## 2022-03-31 MED ORDER — POLYETHYLENE GLYCOL 3350 17 G PO PACK: 17.0000 g | PACK | Freq: Every day | ORAL | Status: DC | PRN

## 2022-03-31 MED ORDER — LACTATED RINGERS IV SOLN: INTRAVENOUS | Status: AC

## 2022-03-31 MED ORDER — DOCUSATE SODIUM 50 MG/5ML PO LIQD: 100.0000 mg | Freq: Two times a day (BID) | ORAL | Status: DC | PRN

## 2022-03-31 MED ORDER — ROCURONIUM BROMIDE 10 MG/ML (PF) SYRINGE
PREFILLED_SYRINGE | INTRAVENOUS | Status: AC
  Administered 2022-03-31: 100 mg via INTRAVENOUS
  Filled 2022-03-31: qty 10

## 2022-03-31 MED ORDER — ALBUTEROL SULFATE (2.5 MG/3ML) 0.083% IN NEBU
2.5000 mg | INHALATION_SOLUTION | RESPIRATORY_TRACT | Status: DC
  Administered 2022-03-31 – 2022-04-01 (×6): 2.5 mg via RESPIRATORY_TRACT
  Filled 2022-03-31 (×6): qty 3

## 2022-03-31 MED ORDER — VITAL AF 1.2 CAL PO LIQD
1000.0000 mL | ORAL | Status: DC
  Administered 2022-03-31: 1000 mL

## 2022-03-31 MED ORDER — OSELTAMIVIR PHOSPHATE 75 MG PO CAPS
75.0000 mg | ORAL_CAPSULE | Freq: Two times a day (BID) | ORAL | Status: DC
  Administered 2022-03-31: 75 mg via ORAL
  Filled 2022-03-31 (×2): qty 1

## 2022-03-31 MED ORDER — LACTATED RINGERS IV BOLUS
1000.0000 mL | Freq: Once | INTRAVENOUS | Status: AC
  Administered 2022-03-31: 1000 mL via INTRAVENOUS

## 2022-03-31 MED ORDER — SODIUM CHLORIDE 0.9 % IV SOLN
400.0000 mg | Freq: Once | INTRAVENOUS | Status: DC
  Filled 2022-03-31: qty 4

## 2022-03-31 MED ORDER — DOCUSATE SODIUM 100 MG PO CAPS: 100.0000 mg | ORAL_CAPSULE | Freq: Two times a day (BID) | ORAL | Status: DC | PRN

## 2022-03-31 MED ORDER — IOHEXOL 350 MG/ML SOLN
100.0000 mL | Freq: Once | INTRAVENOUS | Status: AC | PRN
  Administered 2022-03-31: 100 mL via INTRAVENOUS

## 2022-03-31 MED ORDER — SODIUM CHLORIDE 0.9 % IV SOLN
500.0000 mg | Freq: Once | INTRAVENOUS | Status: AC
  Administered 2022-03-31: 500 mg via INTRAVENOUS
  Filled 2022-03-31: qty 5

## 2022-03-31 MED ORDER — ACETAMINOPHEN 500 MG PO TABS
1000.0000 mg | ORAL_TABLET | Freq: Once | ORAL | Status: AC
  Administered 2022-03-31: 1000 mg via ORAL
  Filled 2022-03-31: qty 2

## 2022-03-31 MED ORDER — FENTANYL 2500MCG IN NS 250ML (10MCG/ML) PREMIX INFUSION
100.0000 ug/h | INTRAVENOUS | Status: DC
  Administered 2022-03-31 – 2022-04-01 (×2): 100 ug/h via INTRAVENOUS
  Filled 2022-03-31 (×2): qty 250

## 2022-03-31 MED ORDER — ETOMIDATE 2 MG/ML IV SOLN
20.0000 mg | Freq: Once | INTRAVENOUS | Status: AC
  Administered 2022-03-31: 20 mg via INTRAVENOUS
  Filled 2022-03-31: qty 10

## 2022-03-31 MED ORDER — METHYLPREDNISOLONE SODIUM SUCC 40 MG IJ SOLR
40.0000 mg | INTRAMUSCULAR | Status: DC
  Administered 2022-03-31: 40 mg via INTRAVENOUS
  Filled 2022-03-31: qty 1

## 2022-03-31 MED ORDER — OSELTAMIVIR PHOSPHATE 75 MG PO CAPS
75.0000 mg | ORAL_CAPSULE | Freq: Every day | ORAL | Status: DC
  Filled 2022-03-31: qty 1

## 2022-03-31 MED ORDER — MAGNESIUM SULFATE 4 GM/100ML IV SOLN
4.0000 g | Freq: Once | INTRAVENOUS | Status: AC
  Administered 2022-03-31: 4 g via INTRAVENOUS
  Filled 2022-03-31: qty 100

## 2022-03-31 MED ORDER — MORPHINE SULFATE (PF) 4 MG/ML IV SOLN
6.0000 mg | Freq: Once | INTRAVENOUS | Status: AC
  Administered 2022-03-31: 6 mg via INTRAVENOUS
  Filled 2022-03-31: qty 2

## 2022-03-31 MED ORDER — PANTOPRAZOLE SODIUM 40 MG IV SOLR
40.0000 mg | INTRAVENOUS | Status: DC
  Administered 2022-03-31 – 2022-04-07 (×8): 40 mg via INTRAVENOUS
  Filled 2022-03-31 (×8): qty 10

## 2022-03-31 MED ORDER — CHLORHEXIDINE GLUCONATE CLOTH 2 % EX PADS
6.0000 | MEDICATED_PAD | Freq: Every day | CUTANEOUS | Status: DC
  Administered 2022-03-31 – 2022-04-03 (×4): 6 via TOPICAL

## 2022-03-31 MED ORDER — SODIUM CHLORIDE 0.9 % IV SOLN: 250.0000 mL | INTRAVENOUS | Status: DC

## 2022-03-31 MED ORDER — ALBUTEROL SULFATE (2.5 MG/3ML) 0.083% IN NEBU
2.5000 mg | INHALATION_SOLUTION | RESPIRATORY_TRACT | Status: DC | PRN
  Administered 2022-03-31: 2.5 mg via RESPIRATORY_TRACT
  Filled 2022-03-31: qty 3

## 2022-03-31 NOTE — Procedures (Signed)
Intubation Procedure Note  Curtis Erickson  [id number]  1990/12/11  Date:03/31/22  Time:6:12 AM   Provider Performing:Babbie Dondlinger A [name]   Procedure: Intubation (31500)  Indication(s) Respiratory Failure  Consent Unable to obtain consent due to emergent nature of procedure.  Anesthesia Fentanyl and Propofol  Time Out Verified patient identification, verified procedure, site/side was marked, verified correct patient position, special equipment/implants available, medications/allergies/relevant history reviewed, required imaging and test results available.  Sterile Technique Usual hand hygeine, masks, and gloves were used   Procedure Description Patient positioned in bed supine.  Sedation given as noted above. Patient was intubated with endotracheal tube using using boujie stylet Glidescope and View was Grade 1 full glottis .  Number of attempts was 1.  Colorimetric CO2 detector was consistent with tracheal placement.  Complications/Tolerance None; patient tolerated the procedure well. Chest X-ray is ordered to verify placement.  EBL Minimal  Specimen(s) None

## 2022-03-31 NOTE — H&P (Addendum)
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  Per ED reports, EMS was called due to respiratory distress.  Patient's girlfriend reported that patient been complaining of bilateral lower extremity edema, flu-like symptoms with worsening shortness of breath today.  On EMS arrival patient was found with RR40-50's, sats 90% on room air.  He was treated with 125 mg of Solu-Medrol, LR 500 mL and DuoNeb x 2.   ED Course: Initial vital signs showed HR of 150 beats/minute, BP 168/75 mm Hg, the RR 28 breaths/minute, and the oxygen saturation 92 % on 3L and a temperature of 103.55F (39.5C).   Pertinent Labs/Diagnostics Findings: Chemistry:Na+/ K+:134/3.9  Glucose:107 CO2:16  Anion Gap: 17 CBC: WBC:2.3  Hgb/Hct:12.2/36.9  Plts: 138 Other Lab findings:  PCT: 0.15  Lactic acid:7.1  COVID PCR: Negative, Influenza A positive. Troponin:30  BNP: 573 Arterial Blood Gas result:  pO2 98; pCO2 38; pH 7.35;  HCO3 20.6, %O2 Sat 97.4.  Imaging: CTA Chest> negative for PE, bilateral groundglass and consolidative opacity concerning for multifocal pneumonia, ARDS or pulmonary edema.  CT Abd/pelvis> left renal pelvis with adjacent stranding questionable pyelonephritis.  Patient was placed on BiPAP due to worsening respiratory symptoms. Patient given gentle IVF and started on broad-spectrum antibiotics ceftriaxone and azithromycin for suspected pneumonia and started on Tamiflu for influenza A infection.  Patient's respiratory continued  to worsen despite BiPAP with respirations between 40s and 50, use of assessor muscle and belly breathing therefore he was intubated for airway protection.  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 12/23: CTA abdomen and pelvis & CTA Chest>1. Perihilar predominant bilateral ground-glass and consolidative opacities are nonspecific but can be seen in the setting of multifocal pneumonia, acute respiratory distress syndrome, or pulmonary edema. 2. The ETT is in the low intrathoracic trachea approximately 5 mm from the carina. Recommend retraction approximately 2-3 cm. 3. Question patchy enhancement of the left kidney versus artifact. There is mild prominence of the left renal pelvis with adjacent stranding. Pyelonephritis is difficult to exclude. 4. Splenomegaly. 5. Small volume free fluid in the pelvis and body wall anasarca.  Micro Data:  12/23: SARS-CoV-2 PCR> negative 12/23: Influenza PCR A> POSITIVE 12/23: Blood culture x2> 12/23: Urine Culture> 12/23: MRSA PCR>>  12/23: Strep pneumo urinary antigen> 12/23: Legionella urinary antigen>  Antimicrobials:  Azithromycin 12/23> Ceftriaxone 12/23>   OBJECTIVE  Blood pressure (!) 117/59, pulse (!) 117, temperature (!) 101.9 F (38.8 C), resp. rate 16, height _0  (1.727 m), weight 88.5 kg, SpO2 96 %.    Vent Mode: AC FiO2 (%):  [50 %-100 %] 100 % Set Rate:  [20  bmp] 20 bmp Vt Set:   [400 mL] 400 mL PEEP:  [15 cmH20] 15 cmH20   Intake/Output Summary (Last 24 hours) at 03/31/2022 0406 Last data filed at 03/31/2022 2122 Gross per 24 hour  Intake 0.24 ml  Output --  Net 0.24 ml   Filed Weights   03/31/22 0048  Weight: 88.5 kg   Physical Examination  GENERAL: 31 year-old critically ill patient lying in the bed  intubated and Sedated EYES: Pupils equal, round, reactive to light and accommodation. No scleral icterus. Extraocular muscles intact.  HEENT: Head atraumatic, normocephalic. Oropharynx and nasopharynx clear.  NECK:  Supple, no jugular venous distention. No thyroid enlargement, no tenderness.  LUNGS: Decreased breath sounds bilaterally, no wheezing, rales,rhonchi or crepitation. No use of accessory muscles of respiration.  CARDIOVASCULAR: S1, S2 normal. No murmurs, rubs, or gallops.  ABDOMEN: Soft, nontender, nondistended. Bowel sounds present. No organomegaly or mass.  EXTREMITIES: Bilateral lower extremity 1+ edema.  Unable to assess muscle strength. Capillary refill >3 secs in all extremities. Pulses palpable distally. NEUROLOGIC: Patient is intubated and sedated. Unable to assess motor function. Sensation is intact bilaterally. Reflexes 2+ bilaterally. Cranial nerves are intact. Gait not checked.  PSYCHIATRIC: The patient is Intubated and sedated SKIN: No obvious rash, lesion, or ulcer.   Labs/imaging that I havepersonally reviewed  (right click and "Reselect all SmartList Selections" daily)     Labs   CBC: Recent Labs  Lab 03/31/22 0034  WBC 2.3*  NEUTROABS 1.0*  HGB 12.2*  HCT 36.9*  MCV 109.5*  PLT 138*    Basic Metabolic Panel: Recent Labs  Lab 03/31/22 0034  NA 134*  K 3.9  CL 101  CO2 16*  GLUCOSE 107*  BUN 9  CREATININE 0.77  CALCIUM 9.0   GFR: Estimated Creatinine Clearance: 145.9 mL/min (by C-G formula based on SCr of 0.77 mg/dL). Recent Labs  Lab 03/31/22 0034 03/31/22 0240  PROCALCITON 0.15  --   WBC 2.3*  --    LATICACIDVEN 7.1* 4.0*    Liver Function Tests: Recent Labs  Lab 03/31/22 0034  AST 40  ALT 19  ALKPHOS 76  BILITOT 2.5*  PROT 7.3  ALBUMIN 3.9   No results for input(s): "LIPASE", "AMYLASE" in the last 168 hours. No results for input(s): "AMMONIA" in the last 168 hours.  ABG    Component Value Date/Time   PHART 7.35 03/31/2022 0128   PCO2ART 38 03/31/2022 0128   PO2ART 98 03/31/2022 0128   HCO3 20.6 03/31/2022 0128   TCO2 8 (L) 03/09/2021 1804   ACIDBASEDEF 3.6 (H) 03/31/2022 0128   O2SAT 97.4 03/31/2022 0128     Coagulation Profile: Recent Labs  Lab 03/31/22 0034  INR 1.6*    Cardiac Enzymes: No results for input(s): "CKTOTAL", "CKMB", "CKMBINDEX", "TROPONINI" in the last 168 hours.  HbA1C: Hgb A1c MFr Bld  Date/Time Value Ref Range Status  03/09/2021 08:50 PM 4.6 (L) 4.8 - 5.6 % Final    Comment:    (NOTE) Pre diabetes:          5.7%-6.4%  Diabetes:              >6.4%  Glycemic control for   <7.0% adults with diabetes   09/17/2019 07:02 PM 4.9 4.8 - 5.6 % Final    Comment:    (NOTE) Pre diabetes:          5.7%-6.4%  Diabetes:              >6.4%  Glycemic control for   <7.0% adults with diabetes     CBG: No results for input(s): "GLUCAP" in the last 168 hours.  Review of Systems:   UNABLE TO OBTAIN PATIENT IS INTUBATED AND SEDATED.  Past Medical History  He,  has a past medical history of Gout, Hypertension, and Kidney stone.   Surgical History    Past Surgical History:  Procedure Laterality Date   APPENDECTOMY     KIDNEY STONE SURGERY       Social History   reports that he has never smoked. He has never used smokeless tobacco. He reports that he does not currently use alcohol. He reports that he does not currently use drugs.   Family History   His family history is not on file.   Allergies Allergies  Allergen Reactions   Allopurinol Other (See Comments)    Drug induced liver injury resulting in Liver/Kidney failure      Home Medications  Prior to Admission medications   Medication Sig Start Date End Date Taking? Authorizing Provider  colchicine 0.6 MG tablet Take 1 tablet (0.6 mg total) by mouth daily for 5 days. 03/16/21 03/21/21  Jose Persia, MD  ferrous sulfate 325 (65 FE) MG tablet Take 1 tablet (325 mg total) by mouth 2 (two) times daily with a meal. Patient not taking: Reported on 03/09/2021 09/19/19   Dhungel, Flonnie Overman, MD  folic acid (FOLVITE) 1 MG tablet Take 1 tablet (1 mg total) by mouth daily. Patient not taking: Reported on 03/09/2021 09/20/19   Dhungel, Flonnie Overman, MD  Multiple Vitamin (MULTIVITAMIN WITH MINERALS) TABS tablet Take 1 tablet by mouth daily. Patient not taking: Reported on 03/09/2021 09/20/19   Dhungel, Flonnie Overman, MD  thiamine 100 MG tablet Take 1 tablet (100 mg total) by mouth daily. Patient not taking: Reported on 03/09/2021 09/20/19   Dhungel, Flonnie Overman, MD  vitamin B-12 1000 MCG tablet Take 1 tablet (1,000 mcg total) by mouth daily. Patient not taking: Reported on 03/09/2021 09/20/19   Louellen Molder, MD      Active Hospital Problem list     Assessment & Plan:  #Acute Hypoxic and Hypercapnic Respiratory Failure #Acute Multifocal Pneumonia/ARDS #Influenza A Infection #Anion Gap Metabolic Acidosis with Lactic Acidosis CT negative for PE -Full vent support, implement lung protective strategies -Plateau pressures less than 30 cm H20 -Wean FiO2 & PEEP as tolerated to maintain O2 sats >92% -Follow intermittent Chest X-ray & ABG as needed -Spontaneous Breathing Trials when respiratory parameters met and mental status permits -Implement VAP Bundle -Bronchodilators and Pulmicort nebs -Continue Tamiflu -Obtain respiratory panel  #Severe Sepsis #Multifocal Pneumonia,  #Influenza A  #Probable Pyelonephritis  Lactic: 7.1, Baseline PCT: 0.15, SK:AJGO bacteria  Initial interventions/workup included: 2 L of LR & Ceftriaxone and Azithromycin meets SIRS criteria: Heart Rate 150  beats/minute, Respiratory Rate 28 breaths/minute,Temperature 103.55F (39.5C)  -Supplemental oxygen as needed, to maintain SpO2 > 90% -F/u cultures, trend lactic/ PCT -Monitor WBC/ fever curve -IV antibiotics: azithromycin & ceftriaxone  -Hydrocortisone for severe pneumonia -IVF hydration as needed:  -Pressors for MAP goal >65 -Strict I/O's  #Acute on Chronic Pancytopenia likely in the setting of acute infection Hx Chronic Macrocytic anemia. Vitamin B12 &Folate Deficiency -Check B12 and Folate  #Gout Pt has hx of recurrent gout treated with Colchicin and prednisone. Was started on Allopurinol 100 mg daily but was discontinued due Liver/Kidney injury requiring CRRT 12/22 -Check Uric acid   Best practice:  Diet:  NPO Pain/Anxiety/Delirium protocol (if indicated): Yes (RASS goal -1) VAP  protocol (if indicated): Yes DVT prophylaxis: LMWH GI prophylaxis: PPI Glucose control:  SSI Yes Central venous access:  N/A Arterial line:  N/A Foley:  Yes, and it is still needed Mobility:  bed rest  PT consulted: N/A Last date of multidisciplinary goals of care discussion [12/23] Code Status:  full code Disposition: ICU   = Goals of Care = Code Status Order: FULL  Primary Emergency Contact: [place] D, Home Phone: [phone] Wishes to pursue full aggressive treatment and intervention options, including CPR and intubation, but goals of care will be addressed on going with family if that should become necessary.  Critical care time: 45 minutes       Rufina Falco, DNP, CCRN, FNP-C, AGACNP-BC Acute Care Nurse Practitioner [facility]  PCCM on call pager [phone] until 7 am

## 2022-03-31 NOTE — Progress Notes (Signed)
Initial Nutrition Assessment  DOCUMENTATION CODES:   Not applicable  INTERVENTION:  - Vital 1.2 to initiate @ 10 mL/hr and advance by 10 mL Q8H to goal rate of 70 mL/hr + TF20 PS x1 - This will provide 2096 kcals, 146 gm protein, and 1360 mL free water.   NUTRITION DIAGNOSIS:   Inadequate oral intake related to inability to eat as evidenced by NPO status.  GOAL:   Provide needs based on ASPEN/SCCM guidelines  MONITOR:   TF tolerance  REASON FOR ASSESSMENT:   Consult Enteral/tube feeding initiation and management  ASSESSMENT:   31 y.o. male admits related to SOB, cough, fevers and chills. PMH includes: HTN, alcoholic fatty liver, microcytic anemia. Pt is currently receiving medical management for acute hypoxic and hypercapnic respiratory failure.  Meds reviewed: solu-medrol. Labs reviewed: Na low, K high. IVF: Fentanyl, Propofol (current rate is providing 420 kcals/days).   Pt is currently NPO and on vent support with OG tube in place. Wts stable per record. RD will initiate TF with slow advancement to goal. Will continue to monitor TF tolerance.   NUTRITION - FOCUSED PHYSICAL EXAM:  Unable to assess, attempt at f/u.  Diet Order:   Diet Order             Diet NPO time specified  Diet effective now                   EDUCATION NEEDS:   Not appropriate for education at this time  Skin:  Skin Assessment: Reviewed RN Assessment  Last BM:  unknown  Height:   Ht Readings from Last 1 Encounters:  03/31/22 5\' 8"  (1.727 m)    Weight:   Wt Readings from Last 1 Encounters:  03/31/22 88.5 kg    Ideal Body Weight:     BMI:  Body mass index is 29.65 kg/m.  Estimated Nutritional Needs:   Kcal:  04/02/22 kcals  Protein:  105-175 gm  Fluid:  >/= 1.9 L  [id number], RD, LDN, CNSC.

## 2022-03-31 NOTE — ED Triage Notes (Signed)
Pt arrives to ED via [facility] d/t respiratory distress. Pt states not feeling well yesterday with cough and generalized body aches. Worsening SOB today called EMS. On EMS arrival pt found RR 40-50S, 90% on room air. In route, Given 125 soul medrol, LR 500, and duoneb x2. Pt T 104. Had 18G RAC

## 2022-03-31 NOTE — ED Notes (Signed)
Pt transported to CT with RT 

## 2022-03-31 NOTE — Progress Notes (Signed)
ETT noted to have large cuff leak, attempted to re-inflate ETT but cuff would not inflate, suspected blown cuff. NP notified and ETT exchanged using boujie stylet. Positive color change, bilateral equal breath sounds. 7.5 ETT secured at 24cm.

## 2022-03-31 NOTE — ED Provider Notes (Signed)
[facility] Provider Note    Event Date/Time   First MD Initiated Contact with Patient 03/31/22 0021     (approximate)   History   Shortness of Breath   HPI  Curtis Erickson is a 31 y.o. male who presents to the ED for evaluation of Shortness of Breath   Patient presents to the ED via EMS from home.  Self-reports no significant medical history beyond gout.  No regular prescription medications.  Denies IVDU, smoking or asthma.  I then review a DC summary from 1 year ago where patient was admitted to [facility] for 1 week due to hepatic and renal failure, thought to be secondary to allopurinol that he took for gout.  He spent some time in the ICU requiring CRRT but his renal and hepatic function improved with supportive care and he was eventually discharged.  Treated for pyelonephritis as well.  Discharged on colchicine and prednisone for a gout flare.  He reports about 24 hours of increasingly worsening shortness of breath, fevers and nonproductive cough.  Multiple coughing fits and cannot catch his breath.  Does report lower extremity edema symmetrically bilaterally.  Somewhat similar to when he was admitted last year.  Denies any recent medications of any kind for gout, including anymore allopurinol, colchicine, prednisone, etc.  No recent illnesses prior to this.   Physical Exam   Triage Vital Signs: ED Triage Vitals [03/31/22 0025]  Enc Vitals Group     BP      Pulse      Resp      Temp      Temp src      SpO2      Weight      Height      Head Circumference      Peak Flow      Pain Score 10     Pain Loc      Pain Edu?      Excl. in GC?     Most recent vital signs: Vitals:   03/31/22 0238 03/31/22 0239  BP: (!) 186/99   Pulse: (!) 167 (!) 164  Resp:    Temp:    SpO2: (!) 81% (!) 80%    General: Awake.  Sitting upright in bed and obviously unwell, tachypneic, frequently coughing and requires frequent redirection.  Warm to the  touch CV:  Good peripheral perfusion.  Tachycardic and regular Resp:  Tachypnea with clear lungs Abd:  No distention.  Soft and benign but does have some tenderness to the RUQ. MSK:  No deformity noted.  No lower extremity swelling appreciated.  No rash appreciated. Neuro:  No focal deficits appreciated. Other:     ED Results / Procedures / Treatments   Labs (all labs ordered are listed, but only abnormal results are displayed) Labs Reviewed  RESP PANEL BY RT-PCR (RSV, FLU A&B, COVID)  RVPGX2 - Abnormal; Notable for the following components:      Result Value   Influenza A by PCR POSITIVE (*)    All other components within normal limits  LACTIC ACID, PLASMA - Abnormal; Notable for the following components:   Lactic Acid, Venous 7.1 (*)    All other components within normal limits  COMPREHENSIVE METABOLIC PANEL - Abnormal; Notable for the following components:   Sodium 134 (*)    CO2 16 (*)    Glucose, Bld 107 (*)    Total Bilirubin 2.5 (*)    Anion gap 17 (*)  All other components within normal limits  CBC WITH DIFFERENTIAL/PLATELET - Abnormal; Notable for the following components:   WBC 2.3 (*)    RBC 3.37 (*)    Hemoglobin 12.2 (*)    HCT 36.9 (*)    MCV 109.5 (*)    MCH 36.2 (*)    Platelets 138 (*)    Neutro Abs 1.0 (*)    All other components within normal limits  PROTIME-INR - Abnormal; Notable for the following components:   Prothrombin Time 18.5 (*)    INR 1.6 (*)    All other components within normal limits  APTT - Abnormal; Notable for the following components:   aPTT 37 (*)    All other components within normal limits  D-DIMER, QUANTITATIVE - Abnormal; Notable for the following components:   D-Dimer, Quant 2.81 (*)    All other components within normal limits  BRAIN NATRIURETIC PEPTIDE - Abnormal; Notable for the following components:   B Natriuretic Peptide 573.6 (*)    All other components within normal limits  BLOOD GAS, ARTERIAL - Abnormal; Notable  for the following components:   Acid-base deficit 3.6 (*)    All other components within normal limits  TROPONIN I (HIGH SENSITIVITY) - Abnormal; Notable for the following components:   Troponin I (High Sensitivity) 30 (*)    All other components within normal limits  CULTURE, BLOOD (ROUTINE X 2)  CULTURE, BLOOD (ROUTINE X 2)  URINE CULTURE  RESPIRATORY PANEL BY PCR  PROCALCITONIN  LACTIC ACID, PLASMA  URINALYSIS, COMPLETE (UACMP) WITH MICROSCOPIC  URIC ACID  TSH  T4, FREE  URINE DRUG SCREEN, QUALITATIVE ([facility] ONLY)  TROPONIN I (HIGH SENSITIVITY)    EKG Poor quality EKG due to patient movement.  Demonstrates sinus tachycardia with a rate of 157 bpm.  No clear signs of acute ischemia.  Similar normal intervals and axis.  RADIOLOGY 1 view CXR interpreted by me with multifocal infiltrates concerning for multifocal pneumonia, ARDS or pulmonary edema  Official radiology report(s): DG Chest Portable 1 View  Result Date: 03/31/2022 CLINICAL DATA:  Dyspnea EXAM: PORTABLE CHEST 1 VIEW COMPARISON:  03/10/2021 FINDINGS: The lungs are symmetrically expanded. Perihilar airspace infiltrate is present, new since prior examination, in keeping with perihilar alveolar pulmonary edema or multifocal infection. No pneumothorax or pleural effusion. Cardiac size is within normal limits. Previously noted left internal jugular hemodialysis catheter has been removed. No acute bone abnormality. IMPRESSION: 1. Interval development of perihilar airspace infiltrate, in keeping with perihilar alveolar pulmonary edema or multifocal infection. Electronically Signed   By: Helyn NumbersAshesh  Parikh M.D.   On: 03/31/2022 00:48    PROCEDURES and INTERVENTIONS:  .Critical Care  Performed by: Delton PrairieSmith, Zya Finkle, MD Authorized by: Delton PrairieSmith, Lashann Hagg, MD   Critical care provider statement:    Critical care time (minutes):  75   Critical care was necessary to treat or prevent imminent or life-threatening deterioration of the following  conditions:  Respiratory failure   Critical care was time spent personally by me on the following activities:  Development of treatment plan with patient or surrogate, discussions with consultants, evaluation of patient's response to treatment, examination of patient, ordering and review of laboratory studies, ordering and review of radiographic studies, ordering and performing treatments and interventions, pulse oximetry, re-evaluation of patient's condition and review of old charts .1-3 Lead EKG Interpretation  Performed by: Delton PrairieSmith, Roxane Puerto, MD Authorized by: Delton PrairieSmith, Elvi Leventhal, MD     Interpretation: abnormal     ECG rate:  130   ECG  rate assessment: tachycardic     Rhythm: sinus tachycardia     Ectopy: none     Conduction: normal   Procedure Name: Intubation Date/Time: 03/31/2022 2:47 AM  Performed by: Delton Prairie, MDPre-anesthesia Checklist: Patient identified, Patient being monitored, Emergency Drugs available, Timeout performed and Suction available Oxygen Delivery Method: Non-rebreather mask Preoxygenation: Pre-oxygenation with 100% oxygen Induction Type: Rapid sequence Ventilation: Mask ventilation without difficulty Laryngoscope Size: Glidescope and 4 Number of attempts: 1 Airway Equipment and Method: Rigid stylet Placement Confirmation: ETT inserted through vocal cords under direct vision, CO2 detector and Breath sounds checked- equal and bilateral Secured at: 24 cm Tube secured with: ETT holder Comments: Copious thin frothy sputum coming from the airway at the time of intubation.      Medications  albuterol (PROVENTIL) (2.5 MG/3ML) 0.083% nebulizer solution 2.5 mg (has no administration in time range)  iohexol (OMNIPAQUE) 350 MG/ML injection 100 mL (has no administration in time range)  propofol (DIPRIVAN) 1000 MG/100ML infusion (20 mcg/kg/min  88.5 kg Intravenous Infusion Verify 03/31/22 0239)  fentaNYL in NS (48mcg/ml) infusion-PREMIX (100 mcg/hr Intravenous  New Bag/Given 03/31/22 0237)  oseltamivir (TAMIFLU) capsule 75 mg (has no administration in time range)  lactated ringers bolus 1,000 mL (0 mLs Intravenous Stopped 03/31/22 0145)  acetaminophen (TYLENOL) tablet 1,000 mg (1,000 mg Oral Given 03/31/22 0036)  morphine (PF) 4 MG/ML injection 6 mg (6 mg Intravenous Given 03/31/22 0044)  LORazepam (ATIVAN) injection 2 mg (2 mg Intravenous Given 03/31/22 0113)  cefTRIAXone (ROCEPHIN) 2 g in sodium chloride 0.9 % 100 mL IVPB (0 g Intravenous Stopped 03/31/22 0217)  azithromycin (ZITHROMAX) 500 mg in sodium chloride 0.9 % 250 mL IVPB (0 mg Intravenous Stopped 03/31/22 0246)  etomidate (AMIDATE) injection 20 mg (20 mg Intravenous Given 03/31/22 0233)  rocuronium (ZEMURON) injection 100 mg (100 mg Intravenous Given 03/31/22 0233)     IMPRESSION / MDM / ASSESSMENT AND PLAN / ED COURSE  I reviewed the triage vital signs and the nursing notes.  Differential diagnosis includes, but is not limited to, PE, pneumonia, sepsis, ARDS, bacteremia, endocarditis  {Patient presents with symptoms of an acute illness or injury that is potentially life-threatening.  31 year old male presents to the ED with rapidly worsening respiratory status in the setting of acute febrile illness, likely ARDS from influenza A, requiring intubation, mechanical dilation and ICU admission.  He is febrile, tachycardic, quite tachypneic and hypoxic on room air requiring nasal cannula, then BiPAP.  He is feeling BiPAP therapy due to work of breathing and I elected to intubate this patient and initiate ARDSnet protocols for ARDS on the vent, which is well-tolerated.  No shock or indication for pressors at this point.  Blood work with leukopenia suggestive of viral disease, fairly reassuring metabolic panel without signs of recurrence of his renal failure or hepatic failure.  His INR is noted to be somewhat elevated 1.6, but LFTs are okay.  Lactic acid is quite high prior to resuscitation.   Procalcitonin is elevated somewhat, BNP is elevated.  He is testing positive for influenza A and I suspect this is the underlying etiology of his symptoms.  He is pending a CTA chest at the time of admission to the ICU to evaluate for acute PE and to help tease out Multifocal [facility] pneumonia versus ARDS.  Did cover him with CAP coverage ceftriaxone and azithromycin.   He is admitted to the ICU with CTA pending, and they agree to follow-up in this study.  Clinical Course as  of 03/31/22 0247  Sat Mar 31, 2022  0034 reassessed [DS]  0059 Reassessed and he now tells me about this admission to the ICU last year.  I reviewed these records.  Add on some blood work. [DS]  0103 Reassessed.  Recommended BiPAP and he is agreeable. [DS]  0200 I consult with our pharmacist regarding options for antiviral therapy in the setting of acute influenza A. [DS]  0215 Reassessed and return to the bedside his fiance and cousin are at the bedside.  His fiance is an Nutritional therapist at [employer].  Patient is worsening.  Respiratory rate of 40-50 on the BiPAP, use accessory muscles now, belly breathing.  I recommended intubation and mechanical ventilation and they are agreeable. [DS]  0219 I consult with ICU overnight NP, Lanora Manis, who will see the patient for ICU admission. [DS]  0244 Smooth intubation.  Frothy thin sputum coming from the airway.  Clinically consistent with ARDS.  Readjusted vent in accordance with ARDSnet protocols for relatively high PEEP and low volumes.  ICU NP comes into the room as were titrating events right after intubation.  Sedated with fentanyl and propofol.  Pressures seem to be holding well with the sedation and PEEP. [DS]    Clinical Course User Index [DS] Delton Prairie, MD     FINAL CLINICAL IMPRESSION(S) / ED DIAGNOSES   Final diagnoses:  ARDS (adult respiratory distress syndrome) (HCC)  Influenza A  Acute respiratory failure with hypoxia (HCC)     Rx / DC Orders   ED  Discharge Orders     None        Note:  This document was prepared using Dragon voice recognition software and may include unintentional dictation errors.   Delton Prairie, MD 03/31/22 [id number]

## 2022-03-31 NOTE — ED Notes (Signed)
Pt's family  Father Chrishaun Sasso can be reached at [phone] Mother Geffrey Michaelsen can be reached at [phone]

## 2022-04-01 ENCOUNTER — Inpatient Hospital Stay: Payer: Self-pay

## 2022-04-01 ENCOUNTER — Inpatient Hospital Stay (HOSPITAL_COMMUNITY)
Admit: 2022-04-01 | Discharge: 2022-04-01 | Disposition: A | Discharge status: Home / Self Care | Payer: Self-pay | Attending: Nurse Practitioner | Admitting: Nurse Practitioner

## 2022-04-01 DIAGNOSIS — I5031 Acute diastolic (congestive) heart failure: Secondary | ICD-10-CM

## 2022-04-01 LAB — CBC WITH DIFFERENTIAL/PLATELET
Abs Immature Granulocytes: 0.02 10*3/uL (ref 0.00–0.07)
Basophils Absolute: 0 10*3/uL (ref 0.0–0.1)
Basophils Relative: 0 %
Eosinophils Absolute: 0 10*3/uL (ref 0.0–0.5)
Eosinophils Relative: 0 %
HCT: 34.4 % — ABNORMAL LOW (ref 39.0–52.0)
Hemoglobin: 11.1 g/dL — ABNORMAL LOW (ref 13.0–17.0)
Immature Granulocytes: 1 %
Lymphocytes Relative: 12 %
Lymphs Abs: 0.5 10*3/uL — ABNORMAL LOW (ref 0.7–4.0)
MCH: 36.4 pg — ABNORMAL HIGH (ref 26.0–34.0)
MCHC: 32.3 g/dL (ref 30.0–36.0)
MCV: 112.8 fL — ABNORMAL HIGH (ref 80.0–100.0)
Monocytes Absolute: 0.1 10*3/uL (ref 0.1–1.0)
Monocytes Relative: 1 %
Neutro Abs: 3.8 10*3/uL (ref 1.7–7.7)
Neutrophils Relative %: 86 %
Platelets: 107 10*3/uL — ABNORMAL LOW (ref 150–400)
RBC: 3.05 MIL/uL — ABNORMAL LOW (ref 4.22–5.81)
RDW: 14.6 % (ref 11.5–15.5)
Smear Review: NORMAL
WBC: 4.3 10*3/uL (ref 4.0–10.5)
nRBC: 0 % (ref 0.0–0.2)

## 2022-04-01 LAB — HEPATIC FUNCTION PANEL
ALT: 18 U/L (ref 0–44)
AST: 35 U/L (ref 15–41)
Albumin: 3.4 g/dL — ABNORMAL LOW (ref 3.5–5.0)
Alkaline Phosphatase: 50 U/L (ref 38–126)
Bilirubin, Direct: 0.6 mg/dL — ABNORMAL HIGH (ref 0.0–0.2)
Indirect Bilirubin: 0.9 mg/dL (ref 0.3–0.9)
Total Bilirubin: 1.5 mg/dL — ABNORMAL HIGH (ref 0.3–1.2)
Total Protein: 6.7 g/dL (ref 6.5–8.1)

## 2022-04-01 LAB — BASIC METABOLIC PANEL
Anion gap: 11 (ref 5–15)
Anion gap: 13 (ref 5–15)
BUN: 31 mg/dL — ABNORMAL HIGH (ref 6–20)
BUN: 36 mg/dL — ABNORMAL HIGH (ref 6–20)
CO2: 22 mmol/L (ref 22–32)
CO2: 20 mmol/L — ABNORMAL LOW (ref 22–32)
Calcium: 8.7 mg/dL — ABNORMAL LOW (ref 8.9–10.3)
Calcium: 9.2 mg/dL (ref 8.9–10.3)
Chloride: 102 mmol/L (ref 98–111)
Chloride: 104 mmol/L (ref 98–111)
Creatinine, Ser: 1.22 mg/dL (ref 0.61–1.24)
Creatinine, Ser: 0.86 mg/dL (ref 0.61–1.24)
GFR, Estimated: >60 mL/min (ref >60)
GFR, Estimated: >60 mL/min (ref >60)
Glucose, Bld: 217 mg/dL — ABNORMAL HIGH (ref 70–99)
Glucose, Bld: 142 mg/dL — ABNORMAL HIGH (ref 70–99)
Potassium: 4.5 mmol/L (ref 3.5–5.1)
Potassium: 3.7 mmol/L (ref 3.5–5.1)
Sodium: 135 mmol/L (ref 135–145)
Sodium: 137 mmol/L (ref 135–145)

## 2022-04-01 LAB — RESPIRATORY PANEL BY PCR

## 2022-04-01 LAB — URINE CULTURE: Culture: NO GROWTH

## 2022-04-01 LAB — GLUCOSE, CAPILLARY
Glucose-Capillary: 137 mg/dL — ABNORMAL HIGH (ref 70–99)
Glucose-Capillary: 156 mg/dL — ABNORMAL HIGH (ref 70–99)
Glucose-Capillary: 158 mg/dL — ABNORMAL HIGH (ref 70–99)
Glucose-Capillary: 175 mg/dL — ABNORMAL HIGH (ref 70–99)
Glucose-Capillary: 189 mg/dL — ABNORMAL HIGH (ref 70–99)
Glucose-Capillary: 196 mg/dL — ABNORMAL HIGH (ref 70–99)

## 2022-04-01 LAB — MAGNESIUM
Magnesium: 2.4 mg/dL (ref 1.7–2.4)
Magnesium: 2.5 mg/dL — ABNORMAL HIGH (ref 1.7–2.4)

## 2022-04-01 LAB — PHOSPHORUS
Phosphorus: 5.4 mg/dL — ABNORMAL HIGH (ref 2.5–4.6)
Phosphorus: 2.6 mg/dL (ref 2.5–4.6)

## 2022-04-01 LAB — SODIUM, URINE, RANDOM: Sodium, Ur: 191 mmol/L

## 2022-04-01 LAB — CREATININE, URINE, RANDOM: Creatinine, Urine: 111 mg/dL

## 2022-04-01 MED ORDER — ALBUTEROL SULFATE (2.5 MG/3ML) 0.083% IN NEBU
2.5000 mg | INHALATION_SOLUTION | Freq: Four times a day (QID) | RESPIRATORY_TRACT | Status: DC
  Filled 2022-04-01: qty 3

## 2022-04-01 MED ORDER — LACTATED RINGERS IV BOLUS: 500.0000 mL | Freq: Once | INTRAVENOUS | Status: DC

## 2022-04-01 MED ORDER — METHYLPREDNISOLONE SODIUM SUCC 40 MG IJ SOLR
40.0000 mg | Freq: Two times a day (BID) | INTRAMUSCULAR | Status: DC
  Administered 2022-04-01 – 2022-04-06 (×12): 40 mg via INTRAVENOUS
  Filled 2022-04-01 (×12): qty 1

## 2022-04-01 NOTE — Progress Notes (Addendum)
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   Per ED reports, EMS was called due to respiratory distress.  Patient's girlfriend reported that patient been complaining of bilateral lower extremity edema, flu-like symptoms with worsening shortness of breath.  On EMS arrival patient was found with RR40-50's, sats 90% on room air.  He was treated with 125 mg of Solu-Medrol, LR 500 mL and DuoNeb x 2.   ED Course: Initial vital signs showed HR of 150 beats/minute, BP 168/75 mm Hg, the RR 28 breaths/minute, and the oxygen saturation 92 % on 3L and a temperature of 103.3F (39.5C).    Pertinent Labs/Diagnostics Findings: Chemistry:Na+/ K+:134/3.9  Glucose:107 CO2:16  Anion Gap: 17 CBC: WBC:2.3  Hgb/Hct:12.2/36.9  Plts: 138 Other Lab findings:  PCT: 0.15  Lactic acid:7.1  COVID PCR: Negative, Influenza A positive. Troponin:30  BNP: 573 Arterial Blood Gas result:  pO2 98; pCO2 38; pH 7.35;  HCO3 20.6, %O2 Sat 97.4.  Imaging: CTA Chest> negative for PE, bilateral groundglass and consolidative opacity concerning for multifocal pneumonia, ARDS or pulmonary edema.  CT Abd/pelvis> left renal pelvis with adjacent stranding questionable pyelonephritis.   Patient was placed on BiPAP due to worsening respiratory symptoms. Patient given gentle IVF and started on broad-spectrum antibiotics ceftriaxone and azithromycin for suspected pneumonia and started on Tamiflu for influenza A infection.  Patient's respiratory continued to worsen despite BiPAP with respirations  between 40s and 50, use of assessor muscle and belly breathing therefore he was intubated for airway protection.  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 rate 20, height 5\' 8"  (1.727 m), weight 87.2 kg, SpO2 94 %.    Vent Mode: PRVC FiO2 (%):  [28 %-50 %] 28 % Set Rate:  [20 bmp] 20 bmp Vt Set:  [440 mL-450 mL] 440 mL PEEP:  [5 cmH20] 5 cmH20 Plateau Pressure:  [13 cmH20] 13 cmH20   Intake/Output Summary (Last 24 hours) at 04/01/2022 04/03/2022 Last data filed at 04/01/2022 0600 Gross per 24 hour  Intake 2551.33 ml  Output 1153 ml  Net 1398.33 ml   Filed Weights   03/31/22 0048 04/01/22 0500  Weight: 88.5 kg 87.2 kg    Examination: Physical Exam Constitutional:      General: He is not in acute distress.    Appearance: He is not ill-appearing.  HENT:     Head: Normocephalic.     Nose: Nose normal.     Mouth/Throat:     Mouth: Mucous membranes are moist.  Eyes:     Pupils: Pupils are equal, round, and reactive to light.  Cardiovascular:     Rate and Rhythm: Normal rate and regular rhythm.  Pulmonary:  Effort: No respiratory distress.     Breath sounds: Rhonchi present.     Comments: Ventilated breath sounds Abdominal:     Palpations: Abdomen is soft.  Musculoskeletal:     Cervical back: Neck supple.     Right lower leg: Edema present.     Left lower leg: Edema present.  Skin:    General: Skin is warm.  Neurological:     Mental Status: He is disoriented.     Comments:  Follows commands on lightening of sedation     Assessment & Plan:  31 year old male with history of liver disease (drug induced liver injury in 2022) who is admitted with acute hypoxic and hypercapnic respiratory failure secondary to influenza infection and possible super imposed bacterial infection.   Neurology #Toxic Metabolic Encephalopathy   In the setting of respiratory failure and hypercapnia on presentation. Now intubated and sedated, started on propofol and fentanyl for sedation.   -goal RASS of 0   Cardiovascular - blood pressure maintained within normal, minimal sedation related hypotension No active issues and patient not in shock. POCUS at the bedside today shows grossly normal RV and LV function with no mitral regurgitation, no pericardial effusion and a distended IVC with no variability (patient is on positive pressure ventilation). Official TTE pending.  -TTE   Pulmonary #Acute Hypoxic and Hypercapnic Respiratory Failure #Influenza A Pneumonia #Multi-focal pneumonia  Intubated secondary to hypoxic and hypercapnic respiratory failure in the setting of influenza and multi-focal pneumonia. Differential includes infectious etiology (influenza, super-imposed bacterial, atypical) vs. inflammatory (vasculitis, capillaritis, vape induced lung injury, organizing pneumonia, eosinophilic pneumonia). Appears to have blown the cuff on the ETT and that was exchanged on arrival to the ICU. Plan to proceed with low tidal volume ventilation targetting 6 mL/kg/IBW and permissive hypercapnia. Goal SpO2 > 92%. Patient tolerated quick down titration of his FiO2. Plateau pressure measured at 17 cmH2O this morning, with a driving pressure of 12. Goal Plateau < 30, driving pressure < 15. Starting Hydrocortisone per CAPE COD until bacterial pneumonia ruled out. Should that be the case, will switch him to regular prednisone dosing for empiric treatment of EVALI.  -methyl-prednisolone 40 mg IV bid for  EVALI -low tidal volume ventilation -SBT once able -await ANA/ANCA/Anti-GBM  Vent Mode: PRVC FiO2 (%):  [28 %-50 %] 28 % Set Rate:  [20 bmp] 20 bmp Vt Set:  [440 mL-450 mL] 440 mL PEEP:  [5 cmH20] 5 cmH20 Plateau Pressure:  [13 cmH20] 13 cmH20  Gastrointestinal Start tube feeds, PPI for SUP. Does have history of liver disease and is thrombocytopenic, concern for underlying cirrhosis.  -check hepatic function panel.   Renal #AKI  Mild aki today, previously kidney function at baseline. Volume resuscitated and now IVC is distended without variability, holding on further fluids.  -urine sodium and creatinine -avoid nephrotoxins -ANA/ANCA/Anti-GBM sent   Endocrine Solumedrol 40 mg BID , monitor glucose.   Hem/Onc Heparin for DVT prophylaxis   ID #Influenza Infection   On Oseltamivir 75 mg bid. Also with CAP coverage given multi-focal pneumonia pending cultures.  Best Practice (right click and "Reselect all SmartList Selections" daily)   Diet/type: tubefeeds DVT prophylaxis: prophylactic heparin  GI prophylaxis: PPI Lines: N/A Foley:  Yes, and it is still needed Code Status:  full code Last date of multidisciplinary goals of care discussion [04/01/2022]  Labs   CBC: Recent Labs  Lab 03/31/22 0034 03/31/22 0642 04/01/22 0446  WBC 2.3* 1.6* 4.3  NEUTROABS 1.0*  --  3.8  HGB 12.2* 11.0* 11.1*  HCT 36.9* 32.6* 34.4*  MCV 109.5* 108.3* 112.8*  PLT 138* 88* 107*    Basic Metabolic Panel: Recent Labs  Lab 03/31/22 0034 03/31/22 0642 03/31/22 0941 03/31/22 1059 03/31/22 1648 04/01/22 0446  NA 134* 132*  --  134* 135 135  K 3.9 5.7*  --  4.7 4.3 4.5  CL 101 101  --  101 102 102  CO2 16* 23  --  22 16* 22  GLUCOSE 107* 149*  --  145* 165* 217*  BUN 9 13  --  13 18 31*  CREATININE 0.77 0.76  --  0.67 1.08 1.22  CALCIUM 9.0 8.1*  --  8.4* 8.2* 8.7*  MG  --  1.4* 1.5*  --  2.3 2.4  PHOS  --  5.3* 4.8*  --  6.1* 5.4*   GFR: Estimated Creatinine  Clearance: 95 mL/min (by C-G formula based on SCr of 1.22 mg/dL). Recent Labs  Lab 03/31/22 0034 03/31/22 0240 03/31/22 0642 04/01/22 0446  PROCALCITON 0.15  --   --   --   WBC 2.3*  --  1.6* 4.3  LATICACIDVEN 7.1* 4.0*  --   --     Liver Function Tests: Recent Labs  Lab 03/31/22 0034 03/31/22 0941  AST 40 35  ALT 19 19  ALKPHOS 76 62  BILITOT 2.5* 2.2*  PROT 7.3 6.8  ALBUMIN 3.9 3.6   No results for input(s): "LIPASE", "AMYLASE" in the last 168 hours. No results for input(s): "AMMONIA" in the last 168 hours.  ABG    Component Value Date/Time   PHART 7.22 (L) 03/31/2022 0503   PCO2ART 58 (H) 03/31/2022 0503   PO2ART 176 (H) 03/31/2022 0503   HCO3 26.3 03/31/2022 0941   TCO2 8 (L) 03/09/2021 1804   ACIDBASEDEF 1.4 03/31/2022 0941   O2SAT 93 03/31/2022 0941     Coagulation Profile: Recent Labs  Lab 03/31/22 0034  INR 1.6*    Cardiac Enzymes: No results for input(s): "CKTOTAL", "CKMB", "CKMBINDEX", "TROPONINI" in the last 168 hours.  HbA1C: Hgb A1c MFr Bld  Date/Time Value Ref Range Status  03/09/2021 08:50 PM 4.6 (L) 4.8 - 5.6 % Final    Comment:    (NOTE) Pre diabetes:          5.7%-6.4%  Diabetes:              >6.4%  Glycemic control for   <7.0% adults with diabetes   09/17/2019 07:02 PM 4.9 4.8 - 5.6 % Final    Comment:    (NOTE) Pre diabetes:          5.7%-6.4%  Diabetes:              >6.4%  Glycemic control for   <7.0% adults with diabetes     CBG: Recent Labs  Lab 03/31/22 1639 03/31/22 1904 03/31/22 2306 04/01/22 0317 04/01/22 0739  GLUCAP 165* 172* 167* 196* 189*    Review of Systems:   Unable to obtain  Past Medical History:  He,  has a past medical history of Gout, Hypertension, and Kidney stone.   Surgical History:   Past Surgical History:  Procedure Laterality Date   APPENDECTOMY     KIDNEY STONE SURGERY       Social History:   reports that he has never smoked. He has never used smokeless tobacco. He reports  that he does not currently use alcohol. He reports that he does not currently use drugs.  Family History:  His family history is not on file.   Allergies Allergies  Allergen Reactions   Allopurinol Other (See Comments)    Drug induced liver injury resulting in Liver/Kidney failure     Home Medications  Prior to Admission medications   Medication Sig Start Date End Date Taking? Authorizing Provider  colchicine 0.6 MG tablet Take 1 tablet (0.6 mg total) by mouth daily for 5 days. 03/16/21 03/21/21  Jose Persia, MD  ferrous sulfate 325 (65 FE) MG tablet Take 1 tablet (325 mg total) by mouth 2 (two) times daily with a meal. Patient not taking: Reported on 03/09/2021 09/19/19   Dhungel, Flonnie Overman, MD  folic acid (FOLVITE) 1 MG tablet Take 1 tablet (1 mg total) by mouth daily. Patient not taking: Reported on 03/09/2021 09/20/19   Dhungel, Flonnie Overman, MD  Multiple Vitamin (MULTIVITAMIN WITH MINERALS) TABS tablet Take 1 tablet by mouth daily. Patient not taking: Reported on 03/09/2021 09/20/19   Dhungel, Flonnie Overman, MD  thiamine 100 MG tablet Take 1 tablet (100 mg total) by mouth daily. Patient not taking: Reported on 03/09/2021 09/20/19   Dhungel, Flonnie Overman, MD  vitamin B-12 1000 MCG tablet Take 1 tablet (1,000 mcg total) by mouth daily. Patient not taking: Reported on 03/09/2021 09/20/19   Louellen Molder, MD     Critical care time: 70 minutes    Armando Reichert, MD [facility]

## 2022-04-01 NOTE — Progress Notes (Signed)
*  PRELIMINARY RESULTS* Echocardiogram 2D Echocardiogram has been performed.  Joanette Gula Lior Cartelli 04/01/2022, 1:44 PM

## 2022-04-01 NOTE — Progress Notes (Signed)
Attempted to put pt on Bipap. He was unable to tolerate it and refused any further attempt

## 2022-04-02 DIAGNOSIS — J9691 Respiratory failure, unspecified with hypoxia: Secondary | ICD-10-CM | POA: Diagnosis present

## 2022-04-02 LAB — BASIC METABOLIC PANEL
Anion gap: 16 — ABNORMAL HIGH (ref 5–15)
BUN: 42 mg/dL — ABNORMAL HIGH (ref 6–20)
CO2: 20 mmol/L — ABNORMAL LOW (ref 22–32)
Calcium: 9.9 mg/dL (ref 8.9–10.3)
Chloride: 104 mmol/L (ref 98–111)
Creatinine, Ser: 0.82 mg/dL (ref 0.61–1.24)
GFR, Estimated: >60 mL/min (ref >60)
Glucose, Bld: 176 mg/dL — ABNORMAL HIGH (ref 70–99)
Potassium: 4.9 mmol/L (ref 3.5–5.1)
Sodium: 140 mmol/L (ref 135–145)

## 2022-04-02 LAB — ECHOCARDIOGRAM COMPLETE
AR max vel: 2.52 cm2
AV Area VTI: 2.75 cm2
AV Area mean vel: 3.12 cm2
AV Mean grad: 9 mmHg
AV Peak grad: 19.9 mmHg
Ao pk vel: 2.23 m/s
Area-P 1/2: 3.9 cm2
Height: 68 in
S' Lateral: 2.9 cm
Weight: 3075.86 oz

## 2022-04-02 LAB — CBC WITH DIFFERENTIAL/PLATELET
Abs Immature Granulocytes: 0.03 10*3/uL (ref 0.00–0.07)
Basophils Absolute: 0 10*3/uL (ref 0.0–0.1)
Basophils Relative: 0 %
Eosinophils Absolute: 0 10*3/uL (ref 0.0–0.5)
Eosinophils Relative: 0 %
HCT: 34.7 % — ABNORMAL LOW (ref 39.0–52.0)
Hemoglobin: 11.7 g/dL — ABNORMAL LOW (ref 13.0–17.0)
Immature Granulocytes: 1 %
Lymphocytes Relative: 18 %
Lymphs Abs: 0.5 10*3/uL — ABNORMAL LOW (ref 0.7–4.0)
MCH: 36.8 pg — ABNORMAL HIGH (ref 26.0–34.0)
MCHC: 33.7 g/dL (ref 30.0–36.0)
MCV: 109.1 fL — ABNORMAL HIGH (ref 80.0–100.0)
Monocytes Absolute: 0.1 10*3/uL (ref 0.1–1.0)
Monocytes Relative: 4 %
Neutro Abs: 2.1 10*3/uL (ref 1.7–7.7)
Neutrophils Relative %: 77 %
Platelets: 118 10*3/uL — ABNORMAL LOW (ref 150–400)
RBC: 3.18 MIL/uL — ABNORMAL LOW (ref 4.22–5.81)
RDW: 14.8 % (ref 11.5–15.5)
WBC: 2.7 10*3/uL — ABNORMAL LOW (ref 4.0–10.5)
nRBC: 0.7 % — ABNORMAL HIGH (ref 0.0–0.2)

## 2022-04-02 LAB — GLUCOSE, CAPILLARY
Glucose-Capillary: 176 mg/dL — ABNORMAL HIGH (ref 70–99)
Glucose-Capillary: 168 mg/dL — ABNORMAL HIGH (ref 70–99)
Glucose-Capillary: 147 mg/dL — ABNORMAL HIGH (ref 70–99)
Glucose-Capillary: 184 mg/dL — ABNORMAL HIGH (ref 70–99)
Glucose-Capillary: 208 mg/dL — ABNORMAL HIGH (ref 70–99)
Glucose-Capillary: 149 mg/dL — ABNORMAL HIGH (ref 70–99)

## 2022-04-02 LAB — PROTEIN / CREATININE RATIO, URINE
Creatinine, Urine: 91 mg/dL
Protein Creatinine Ratio: 0.85 mg/mg{Cre} — ABNORMAL HIGH (ref 0.00–0.15)
Total Protein, Urine: 77 mg/dL

## 2022-04-02 LAB — MAGNESIUM: Magnesium: 2.5 mg/dL — ABNORMAL HIGH (ref 1.7–2.4)

## 2022-04-02 LAB — PHOSPHORUS: Phosphorus: 2.6 mg/dL (ref 2.5–4.6)

## 2022-04-02 MED ORDER — DM-GUAIFENESIN ER 30-600 MG PO TB12
1.0000 | ORAL_TABLET | Freq: Two times a day (BID) | ORAL | Status: DC
  Administered 2022-04-02 – 2022-04-03 (×3): 1 via ORAL
  Filled 2022-04-02 (×3): qty 1

## 2022-04-02 MED ORDER — POLYETHYLENE GLYCOL 3350 17 G PO PACK: 17.0000 g | PACK | Freq: Every day | ORAL | Status: DC | PRN

## 2022-04-02 MED ORDER — LEVALBUTEROL HCL 0.63 MG/3ML IN NEBU
0.6300 mg | INHALATION_SOLUTION | Freq: Two times a day (BID) | RESPIRATORY_TRACT | Status: DC
  Administered 2022-04-02 – 2022-04-03 (×3): 0.63 mg via RESPIRATORY_TRACT
  Filled 2022-04-02 (×3): qty 3

## 2022-04-02 NOTE — Consult Note (Signed)
[facility] Consult Note: 04/02/22     Date of Admission:  03/31/2022           Reason for Consult: AKI, Proteinuria    Referring Provider: Armando Reichert, MD Primary Care Provider: Adin Hector, MD   History of Presenting Illness:  Curtis Erickson is a 31 y.o. male who is seen for proteinuria. 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 This time he presented by EMS for respiratory distress.  He has been having some lower extremity edema bilaterally, flulike symptoms and worsening shortness of breath. He tested positive for influenza A. He underwent CT angiogram of the chest which was negative for pulmonary embolism but did show bilateral groundglass and consolidative opacities concerning for multifocal pneumonia, ARDS or pulmonary edema. Initially he was placed on BiPAP but his condition worsened therefore he was intubated.  He is extubated now.  Review of labs: Baseline creatinine of 0.6-0.7 at admission.  Peaked at 1.2, now down to 0.82.  He has leukopenia and mild anemia. Urinalysis from 03/31/2022-protein greater than 300.  Protein was also noticed in 2021 and 2022.  Previous urine protein to creatinine ratio 0.25 g from December 2022.  Of note, urinalysis from June 06, 2021 [facility]) was negative for protein.   Uric acid was elevated at 10.0 at that time.  Previous SPEP negative for M spike. This admission, urine drug screen positive for opioids. He had a 2D echo.  Results are pending.  Today he reports that he has cough, primarily induced with any movement.  He reports drinking beer occasionally.  Denies any IV drug use.  No history of significant nonsteroidal use.  He states he started developing lower extremity edema around Thanksgiving. Reports joint  pains from gout.  No rashes usually.  Review of Systems: ROS Gen: Denies any fevers or chills HEENT: No vision or hearing problems CV: No chest pain or shortness of breath.  Does have lower extremity edema Resp: Has nonproductive cough, no hemoptysis GI: No nausea, vomiting or diarrhea.  No blood in the stool GU : No problems with voiding.  No hematuria.  No previous history of kidney problems or family history of lupus or dialysis MS: Ambulatory.  Denies any acute joint pain or swelling at present.  History of gout Derm:   No complaints Psych: No complaints Heme: No complaints Neuro: No complaints Endocrine: No complaints   Past Medical History:  Diagnosis Date   Gout    Hypertension    Kidney stone     Social History   Tobacco Use   Smoking status: Never   Smokeless tobacco: Never  Vaping Use   Vaping Use: Never used  Substance Use Topics   Alcohol use: Not Currently   Drug use: Not Currently    History reviewed. No pertinent family history.   OBJECTIVE: Blood pressure 135/74, pulse (!) 111, temperature 99 F (37.2 C), temperature source Oral, resp. rate (!) 25, height 5\' 8"  (1.727 m), weight 87.4 kg, SpO2 95 %.  Physical Exam Physical Exam: General:  No acute distress, laying in the bed  HEENT  anicteric, moist oral mucous membrane  Pulm/lungs Coarse breath sounds, mild crackles at bases, [place] O2 6 L/min  CVS/Heart  regular rhythm, no rub or gallop, tachycardic  Abdomen:   Soft, nontender  Extremities:  1+  peripheral edema  Neurologic:  Alert, oriented, able to follow commands  Skin:  No acute rashes    Lab Results Lab Results  Component Value Date   WBC 2.7 (L) 04/02/2022   HGB 11.7 (L) 04/02/2022   HCT 34.7 (L) 04/02/2022   MCV 109.1 (H) 04/02/2022   PLT 118 (L) 04/02/2022    Lab Results  Component Value Date   CREATININE 0.82 04/02/2022   BUN 42 (H) 04/02/2022   NA 140 04/02/2022   K 4.9 04/02/2022   CL 104 04/02/2022   CO2 20 (L) 04/02/2022     Lab Results  Component Value Date   ALT 18 04/01/2022   AST 35 04/01/2022   GGT 45 03/09/2021   ALKPHOS 50 04/01/2022   BILITOT 1.5 (H) 04/01/2022     Microbiology: Recent Results (from the past 240 hour(s))  Blood Culture (routine x 2)     Status: None (Preliminary result)   Collection Time: 03/31/22 12:34 AM   Specimen: BLOOD  Result Value Ref Range Status   Specimen Description BLOOD RIGHT ANTECUBITAL  Final   Special Requests   Final    BOTTLES DRAWN AEROBIC AND ANAEROBIC Blood Culture results may not be optimal due to an excessive volume of blood received in culture bottles   Culture   Final    NO GROWTH 2 DAYS Performed at [facility], [address] [place], [place] [address]    Report Status PENDING  Incomplete  Resp panel by RT-PCR (RSV, Flu A&B, Covid) Anterior Nasal Swab     Status: Abnormal   Collection Time: 03/31/22 12:34 AM   Specimen: Anterior Nasal Swab  Result Value Ref Range Status   SARS Coronavirus 2 by RT PCR NEGATIVE NEGATIVE Final    Comment: (NOTE) SARS-CoV-2 target nucleic acids are NOT DETECTED.  The SARS-CoV-2 RNA is generally detectable in upper respiratory specimens during the acute phase of infection. The lowest concentration of SARS-CoV-2 viral copies this assay can detect is 138 copies/mL. A negative result does not preclude SARS-Cov-2 infection and should not be used as the sole basis for treatment or other patient management decisions. A negative result may occur with  improper specimen collection/handling, submission of specimen other than nasopharyngeal swab, presence of viral mutation(s) within the areas targeted by this assay, and inadequate number of viral copies(<138 copies/mL). A negative result must be combined with clinical observations, patient history, and epidemiological information. The expected result is Negative.  Fact Sheet for Patients:  BloggerCourse.com  Fact Sheet for  Healthcare Providers:  SeriousBroker.it  This test is no t yet approved or cleared by the [place] FDA and  has been authorized for detection and/or diagnosis of SARS-CoV-2 by FDA under an Emergency Use Authorization (EUA). This EUA will remain  in effect (meaning this test can be used) for the duration of the COVID-19 declaration under Section 564(b)(1) of the Act, 21 U.S.C.section 360bbb-3(b)(1), unless the authorization is terminated  or revoked sooner.       Influenza A by PCR POSITIVE (A) NEGATIVE Final   Influenza B by PCR NEGATIVE NEGATIVE Final    Comment: (NOTE) The Xpert Xpress SARS-CoV-2/FLU/RSV plus assay is intended as an aid in the diagnosis of influenza from Nasopharyngeal swab specimens and should not be used as a sole basis for treatment. Nasal washings and aspirates are unacceptable for Xpert Xpress SARS-CoV-2/FLU/RSV testing.  Fact Sheet for Patients: BloggerCourse.com  Fact Sheet for Healthcare Providers: SeriousBroker.it  This test is not yet approved  or cleared by the [place] and has been authorized for detection and/or diagnosis of SARS-CoV-2 by FDA under an Emergency Use Authorization (EUA). This EUA will remain in effect (meaning this test can be used) for the duration of the COVID-19 declaration under Section 564(b)(1) of the Act, 21 U.S.C. section 360bbb-3(b)(1), unless the authorization is terminated or revoked.     Resp Syncytial Virus by PCR NEGATIVE NEGATIVE Final    Comment: (NOTE) Fact Sheet for Patients: EntrepreneurPulse.com.au  Fact Sheet for Healthcare Providers: IncredibleEmployment.be  This test is not yet approved or cleared by the [place] FDA and has been authorized for detection and/or diagnosis of SARS-CoV-2 by FDA under an Emergency Use Authorization (EUA). This EUA will remain in effect (meaning  this test can be used) for the duration of the COVID-19 declaration under Section 564(b)(1) of the Act, 21 U.S.C. section 360bbb-3(b)(1), unless the authorization is terminated or revoked.  Performed at [facility], [address] [place], [place] [address]   Blood Culture (routine x 2)     Status: None (Preliminary result)   Collection Time: 03/31/22 12:55 AM   Specimen: BLOOD  Result Value Ref Range Status   Specimen Description BLOOD LEFT ANTECUBITAL  Final   Special Requests   Final    BOTTLES DRAWN AEROBIC AND ANAEROBIC Blood Culture results may not be optimal due to an excessive volume of blood received in culture bottles   Culture   Final    NO GROWTH 2 DAYS Performed at [facility], [address] [place], [place] [address]    Report Status PENDING  Incomplete  Urine Culture     Status: None   Collection Time: 03/31/22  2:59 AM   Specimen: Urine, Clean Catch  Result Value Ref Range Status   Specimen Description   Final    URINE, CLEAN CATCH Performed at [facility], [address], [place], [place] [address]    Special Requests   Final    NONE Performed at [facility], [address], [place], [place] [address]    Culture   Final    NO GROWTH Performed at [facility], [facility] [address], [place], [place] [address]    Report Status 04/01/2022 FINAL  Final  Respiratory (~20 pathogens) panel by PCR     Status: Abnormal   Collection Time: 03/31/22  2:59 AM   Specimen: Urine, Clean Catch; Respiratory  Result Value Ref Range Status   Adenovirus NOT DETECTED NOT DETECTED Final   Coronavirus 229E NOT DETECTED NOT DETECTED Final    Comment: (NOTE) The Coronavirus on the Respiratory Panel, DOES NOT test for the novel  Coronavirus (2019 nCoV)    Coronavirus HKU1 NOT DETECTED NOT DETECTED Final   Coronavirus NL63 NOT DETECTED NOT DETECTED Final   Coronavirus OC43 NOT DETECTED NOT DETECTED Final   Metapneumovirus NOT  DETECTED NOT DETECTED Final   Rhinovirus / Enterovirus NOT DETECTED NOT DETECTED Final   Influenza A H1 2009 DETECTED (A) NOT DETECTED Final   Influenza B NOT DETECTED NOT DETECTED Final   Parainfluenza Virus 1 NOT DETECTED NOT DETECTED Final   Parainfluenza Virus 2 NOT DETECTED NOT DETECTED Final   Parainfluenza Virus 3 NOT DETECTED NOT DETECTED Final   Parainfluenza Virus 4 NOT DETECTED NOT DETECTED Final   Respiratory Syncytial Virus NOT DETECTED NOT DETECTED Final   Bordetella pertussis NOT DETECTED NOT DETECTED Final   Bordetella Parapertussis NOT DETECTED NOT DETECTED Final  Chlamydophila pneumoniae NOT DETECTED NOT DETECTED Final   Mycoplasma pneumoniae NOT DETECTED NOT DETECTED Final    Comment: Performed at [facility], [address] [place], [place] [address]  MRSA Next Gen by PCR, Nasal     Status: None   Collection Time: 03/31/22  4:45 AM   Specimen: Nasal Mucosa; Nasal Swab  Result Value Ref Range Status   MRSA by PCR Next Gen NOT DETECTED NOT DETECTED Final    Comment: (NOTE) The GeneXpert MRSA Assay (FDA approved for NASAL specimens only), is one component of a comprehensive MRSA colonization surveillance program. It is not intended to diagnose MRSA infection nor to guide or monitor treatment for MRSA infections. Test performance is not FDA approved in patients less than 28 years old. Performed at [facility], [address] [place], [place] [address]   Culture, Respiratory w Gram Stain     Status: None (Preliminary result)   Collection Time: 04/01/22 11:23 AM   Specimen: Tracheal Aspirate; Respiratory  Result Value Ref Range Status   Specimen Description   Final    TRACHEAL ASPIRATE Performed at [facility], [address] [place], [place] [address]    Special Requests   Final    NONE Performed at [facility], [address] [place], [place] [address]    Gram Stain   Final    MODERATE WBC PRESENT, PREDOMINANTLY  PMN NO ORGANISMS SEEN    Culture   Final    NO GROWTH < 24 HOURS Performed at [facility], [address], [place], [place] [address]    Report Status PENDING  Incomplete    Medications: Scheduled Meds:  Chlorhexidine Gluconate Cloth  6 each Topical Daily   feeding supplement (PROSource TF20)  60 mL Per Tube Daily   heparin  5,000 Units Subcutaneous Q8H   insulin aspart  0-9 Units Subcutaneous Q4H   levalbuterol  0.63 mg Nebulization BID   methylPREDNISolone (SOLU-MEDROL) injection  40 mg Intravenous BID   oseltamivir  75 mg Per Tube BID   pantoprazole (PROTONIX) IV  40 mg Intravenous Q24H   Continuous Infusions:  azithromycin Stopped (04/02/22 0350)   cefTRIAXone (ROCEPHIN)  IV Stopped (04/02/22 0242)   feeding supplement (VITAL AF 1.2 CAL) Stopped (04/01/22 1923)   fentaNYL infusion INTRAVENOUS Stopped (04/01/22 1159)   norepinephrine (LEVOPHED) Adult infusion Stopped (04/01/22 1425)   propofol (DIPRIVAN) infusion Stopped (04/01/22 1058)   PRN Meds:.docusate, fentaNYL, polyethylene glycol  Allergies  Allergen Reactions   Allopurinol Other (See Comments)    Drug induced liver injury resulting in Liver/Kidney failure    Urinalysis: Recent Labs    03/31/22 0259  COLORURINE YELLOW*  LABSPEC 1.018  PHURINE 5.0  GLUCOSEU NEGATIVE  HGBUR SMALL*  BILIRUBINUR NEGATIVE  KETONESUR NEGATIVE  PROTEINUR >=300*  NITRITE NEGATIVE  LEUKOCYTESUR NEGATIVE      Imaging: DG Chest Port 1 View  Result Date: 04/01/2022 CLINICAL DATA:  Respiratory failure with hypoxia EXAM: PORTABLE CHEST 1 VIEW COMPARISON:  Radiograph 03/31/2022 FINDINGS: Endotracheal tube overlies the trachea proximally 2.2 cm above the carina. Orogastric tube side port overlies the stomach. Unchanged enlarged cardiac silhouette. There is severe diffuse airspace disease, slightly improved from prior exam. No large effusion or evidence of pneumothorax. No acute osseous abnormality. IMPRESSION: Persistent  diffuse airspace disease, slightly improved in comparison to prior exam. Electronically Signed   By: Caprice Renshaw M.D.   On: 04/01/2022 07:55      Assessment/Plan:  Curtis Pacas  A Erickson is a 31 y.o. male with medical problems of      was admitted on 03/31/2022 for :  ARDS (adult respiratory distress syndrome) (HCC) [J80] Influenza A [J10.1] Acute respiratory failure with hypoxia ([place]) [J96.01]  #Proteinuria #Hyperglycemia #Acute kidney injury with creatinine increased from 0.67-1.22, now back to 0.82.  Likely from IV contrast exposure. #Anemia #Leukopenia #Thrombocytopenia # ARDS, acute respiratory failure, influenza A positive  Plan: Will obtain urine protein to creatinine ratio.  Previous urine protein to creatinine ratio in December 2022 was 0.25 g.  Agree with autoimmune workup.  ANA, ANCA profile, anti-GBM antibody pending.  HIV nonreactive.  Will add complement anti-PLA 2R antibody, C3 and C4.  Agree with empiric treatment with Solu-Medrol for now.  Agree with obtaining hemoglobin A1c. Further recommendations once more information is available.    Curtis Erickson 04/02/22

## 2022-04-02 NOTE — Progress Notes (Signed)
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   Per ED reports, EMS was called due to respiratory distress.  Patient's girlfriend reported that patient been complaining of bilateral lower extremity edema, flu-like symptoms with worsening shortness of breath.  On EMS arrival patient was found with RR40-50's, sats 90% on room air.  He was treated with 125 mg of Solu-Medrol, LR 500 mL and DuoNeb x 2.   ED Course: Initial vital signs showed HR of 150 beats/minute, BP 168/75 mm Hg, the RR 28 breaths/minute, and the oxygen saturation 92 % on 3L and a temperature of 103.25F (39.5C).    Pertinent Labs/Diagnostics Findings: Chemistry:Na+/ K+:134/3.9  Glucose:107 CO2:16  Anion Gap: 17 CBC: WBC:2.3  Hgb/Hct:12.2/36.9  Plts: 138 Other Lab findings:  PCT: 0.15  Lactic acid:7.1  COVID PCR: Negative, Influenza A positive. Troponin:30  BNP: 573 Arterial Blood Gas result:  pO2 98; pCO2 38; pH 7.35;  HCO3 20.6, %O2 Sat 97.4.  Imaging: CTA Chest> negative for PE, bilateral groundglass and consolidative opacity concerning for multifocal pneumonia, ARDS or pulmonary edema.  CT Abd/pelvis> left renal pelvis with adjacent stranding questionable pyelonephritis.   Patient was placed on BiPAP due to worsening respiratory symptoms. Patient given gentle IVF and started on broad-spectrum antibiotics ceftriaxone and azithromycin for suspected pneumonia and started on Tamiflu for influenza A infection.  Patient's respiratory continued to worsen despite BiPAP with respirations  between 40s and 50, use of assessor muscle and belly breathing therefore he was intubated for airway protection.  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 rate (!) 25, height 5\' 8"  (1.727 m), weight 87.4 kg, SpO2 95 %.    FiO2 (%):  [28 %-35 %] 35 %   Intake/Output Summary (Last 24 hours) at 04/02/2022 0854 Last data filed at 04/02/2022 0400 Gross per 24 hour  Intake 1647.49 ml  Output 1175 ml  Net 472.49 ml    Filed Weights   03/31/22 0048 04/01/22 0500 04/02/22 0157  Weight: 88.5 kg 87.2 kg 87.4 kg    Examination: Physical Exam Constitutional:      General: He is not in acute distress.    Appearance: He is not ill-appearing.  HENT:     Head: Normocephalic.     Nose: Nose normal.     Mouth/Throat:     Mouth: Mucous membranes are moist.  Eyes:     Pupils: Pupils are equal, round, and reactive to light.  Cardiovascular:     Rate and Rhythm: Normal rate and regular rhythm.  Pulmonary:     Effort: No respiratory distress.     Breath sounds: Rales present.  Abdominal:     Palpations:  Abdomen is soft.  Musculoskeletal:     Cervical back: Neck supple.     Right lower leg: Edema present.     Left lower leg: Edema present.  Skin:    General: Skin is warm.  Neurological:     General: No focal deficit present.     Mental Status: He is oriented to person, place, and time. Mental status is at baseline.     Assessment & Plan:  31 year old male with history of  liver disease (drug induced liver injury in 2022) who is admitted with acute hypoxic and hypercapnic respiratory failure secondary to influenza infection and possible super imposed bacterial infection.   Neurology #Toxic Metabolic Encephalopathy - resolved   In the setting of respiratory failure and hypercapnia on presentation. Extubated yesterday and is appropriate today. Avoid psychotropic medications.   Cardiovascular  blood pressure maintained within normal, minimal sedation related hypotension resolved with discontinuation of propofol. No active issues and patient not in shock. POCUS at the bedside by me yesterday showed grossly normal RV and LV function with no mitral regurgitation, no pericardial effusion and a distended IVC with no variability (patient is on positive pressure ventilation). Official TTE pending.  -TTE   Pulmonary #Acute Hypoxic and Hypercapnic Respiratory Failure #Influenza A Pneumonia #Multi-focal pneumonia  Intubated secondary to hypoxic and hypercapnic respiratory failure in the setting of influenza and multi-focal pneumonia. Differential includes infectious etiology (influenza, super-imposed bacterial, atypical) vs. inflammatory (vasculitis, capillaritis, electronic vape associated lung injury, organizing pneumonia, eosinophilic pneumonia). Initially started hydrocortisone but have switched him to solumedrol for empiric treatment of EVALI.  -methyl-prednisolone 40 mg IV bid for EVALI, switch to 60 mg tomorrow -await ANA/ANCA/Anti-GBM  Gastrointestinal Passed bedside swallowing assessment. Does have history of liver disease and is thrombocytopenic, liver contours smooth on imaging. Concern for underlying cirrhosis.   Renal #AKI  Mild aki on presentation, improved. Does have significant proteinuria on presentation UA. Concern for nephrotic syndrome vs vasculitic process vs diabetes induced nephropathy.  -urine sodium and creatinine -avoid  nephrotoxins -ANA/ANCA/Anti-GBM sent -highly appreciate input from nephrology   Endocrine Solumedrol 40 mg BID , monitor glucose. Check A1c   Hem/Onc Heparin for DVT prophylaxis   ID #Influenza Infection   On Oseltamivir 75 mg bid. Also with CAP coverage given multi-focal pneumonia pending cultures.  Best Practice (right click and "Reselect all SmartList Selections" daily)   Diet/type: Regular consistency (see orders) DVT prophylaxis: prophylactic heparin  GI prophylaxis: PPI Lines: N/A Foley:  N/A Code Status:  full code Last date of multidisciplinary goals of care discussion [04/02/2022]  Labs   CBC: Recent Labs  Lab 03/31/22 0034 03/31/22 0642 04/01/22 0446 04/02/22 0424  WBC 2.3* 1.6* 4.3 2.7*  NEUTROABS 1.0*  --  3.8 2.1  HGB 12.2* 11.0* 11.1* 11.7*  HCT 36.9* 32.6* 34.4* 34.7*  MCV 109.5* 108.3* 112.8* 109.1*  PLT 138* 88* 107* 118*     Basic Metabolic Panel: Recent Labs  Lab 03/31/22 0941 03/31/22 1059 03/31/22 1648 04/01/22 0446 04/01/22 1704 04/02/22 0424  NA  --  134* 135 135 137 140  K  --  4.7 4.3 4.5 3.7 4.9  CL  --  101 102 102 104 104  CO2  --  22 16* 22 20* 20*  GLUCOSE  --  145* 165* 217* 142* 176*  BUN  --  13 18 31* 36* 42*  CREATININE  --  0.67 1.08 1.22 0.86 0.82  CALCIUM  --  8.4* 8.2* 8.7* 9.2 9.9  MG 1.5*  --  2.3 2.4 2.5* 2.5*  PHOS 4.8*  --  6.1* 5.4* 2.6 2.6    GFR: Estimated Creatinine Clearance: 141.6 mL/min (by C-G formula based on SCr of 0.82 mg/dL). Recent Labs  Lab 03/31/22 0034 03/31/22 0240 03/31/22 0642 04/01/22 0446 04/02/22 0424  PROCALCITON 0.15  --   --   --   --   WBC 2.3*  --  1.6* 4.3 2.7*  LATICACIDVEN 7.1* 4.0*  --   --   --      Liver Function Tests: Recent Labs  Lab 03/31/22 0034 03/31/22 0941 04/01/22 0440  AST 40 35 35  ALT 19 19 18   ALKPHOS 76 62 50  BILITOT 2.5* 2.2* 1.5*  PROT 7.3 6.8 6.7  ALBUMIN 3.9 3.6 3.4*    No results for input(s): "LIPASE", "AMYLASE" in the last 168  hours. No results for input(s): "AMMONIA" in the last 168 hours.  ABG    Component Value Date/Time   PHART 7.22 (L) 03/31/2022 0503   PCO2ART 58 (H) 03/31/2022 0503   PO2ART 176 (H) 03/31/2022 0503   HCO3 26.3 03/31/2022 0941   TCO2 8 (L) 03/09/2021 1804   ACIDBASEDEF 1.4 03/31/2022 0941   O2SAT 93 03/31/2022 0941     Coagulation Profile: Recent Labs  Lab 03/31/22 0034  INR 1.6*     Cardiac Enzymes: No results for input(s): "CKTOTAL", "CKMB", "CKMBINDEX", "TROPONINI" in the last 168 hours.  HbA1C: Hgb A1c MFr Bld  Date/Time Value Ref Range Status  03/09/2021 08:50 PM 4.6 (L) 4.8 - 5.6 % Final    Comment:    (NOTE) Pre diabetes:          5.7%-6.4%  Diabetes:              >6.4%  Glycemic control for   <7.0% adults with diabetes   09/17/2019 07:02 PM 4.9 4.8 - 5.6 % Final    Comment:    (NOTE) Pre diabetes:          5.7%-6.4%  Diabetes:              >6.4%  Glycemic control for   <7.0% adults with diabetes     CBG: Recent Labs  Lab 04/01/22 1513 04/01/22 1905 04/01/22 2331 04/02/22 0401 04/02/22 0732  GLUCAP 156* 137* 158* 176* 168*     Review of Systems:   Unable to obtain  Past Medical History:  He,  has a past medical history of Gout, Hypertension, and Kidney stone.   Surgical History:   Past Surgical History:  Procedure Laterality Date   APPENDECTOMY     KIDNEY STONE SURGERY       Social History:   reports that he has never smoked. He has never used smokeless tobacco. He reports that he does not currently use alcohol. He reports that he does not currently use drugs.   Family History:  His family history is not on file.   Allergies Allergies  Allergen Reactions   Allopurinol Other (See Comments)    Drug induced liver injury resulting in Liver/Kidney failure     Home Medications  Prior to Admission medications   Medication Sig Start Date End Date Taking? Authorizing Provider  colchicine 0.6 MG tablet Take 1 tablet (0.6 mg  total) by mouth daily for 5 days. 03/16/21 03/21/21  Jose Persia, MD  ferrous sulfate 325 (65 FE) MG tablet Take 1 tablet (325 mg total) by mouth 2 (two) times daily with a meal. Patient not  taking: Reported on 03/09/2021 09/19/19   Dhungel, Flonnie Overman, MD  folic acid (FOLVITE) 1 MG tablet Take 1 tablet (1 mg total) by mouth daily. Patient not taking: Reported on 03/09/2021 09/20/19   Dhungel, Flonnie Overman, MD  Multiple Vitamin (MULTIVITAMIN WITH MINERALS) TABS tablet Take 1 tablet by mouth daily. Patient not taking: Reported on 03/09/2021 09/20/19   Dhungel, Flonnie Overman, MD  thiamine 100 MG tablet Take 1 tablet (100 mg total) by mouth daily. Patient not taking: Reported on 03/09/2021 09/20/19   Dhungel, Flonnie Overman, MD  vitamin B-12 1000 MCG tablet Take 1 tablet (1,000 mcg total) by mouth daily. Patient not taking: Reported on 03/09/2021 09/20/19   Louellen Molder, MD     Total care time: 25    Armando Reichert, MD [facility]

## 2022-04-02 NOTE — Consult Note (Signed)
PHARMACY CONSULT NOTE - FOLLOW UP  Pharmacy Consult for Electrolyte Monitoring and Replacement   Recent Labs: Potassium (mmol/L)  Date Value  04/02/2022 4.9   Magnesium (mg/dL)  Date Value  49/44/9675 2.5 (H)   Calcium (mg/dL)  Date Value  91/63/8466 9.9   Albumin (g/dL)  Date Value  59/93/5701 3.4 (L)   Phosphorus (mg/dL)  Date Value  77/93/9030 2.6   Sodium (mmol/L)  Date Value  04/02/2022 140     Assessment: 31 year old male with history of liver disease (drug induced liver injury in 2022) who is admitted with acute hypoxic and hypercapnic respiratory failure secondary to influenza infection and possible super imposed bacterial infection. Pharmacy has been consulted to monitor and replace electrolytes while under PCCM care.  Goal of Therapy:  Electrolytes WNL  Plan:  No replacement currently indicated Will recheck electrolytes with AM labs  Bettey Costa ,PharmD Clinical Pharmacist 04/02/2022 7:58 AM

## 2022-04-03 DIAGNOSIS — G9341 Metabolic encephalopathy: Secondary | ICD-10-CM

## 2022-04-03 DIAGNOSIS — N179 Acute kidney failure, unspecified: Secondary | ICD-10-CM

## 2022-04-03 DIAGNOSIS — J9601 Acute respiratory failure with hypoxia: Secondary | ICD-10-CM

## 2022-04-03 DIAGNOSIS — J101 Influenza due to other identified influenza virus with other respiratory manifestations: Secondary | ICD-10-CM

## 2022-04-03 LAB — BASIC METABOLIC PANEL
Anion gap: 12 (ref 5–15)
BUN: 41 mg/dL — ABNORMAL HIGH (ref 6–20)
CO2: 22 mmol/L (ref 22–32)
Calcium: 9.6 mg/dL (ref 8.9–10.3)
Chloride: 105 mmol/L (ref 98–111)
Creatinine, Ser: 0.73 mg/dL (ref 0.61–1.24)
GFR, Estimated: >60 mL/min (ref >60)
Glucose, Bld: 156 mg/dL — ABNORMAL HIGH (ref 70–99)
Potassium: 5.1 mmol/L (ref 3.5–5.1)
Sodium: 139 mmol/L (ref 135–145)

## 2022-04-03 LAB — CBC
HCT: 34.1 % — ABNORMAL LOW (ref 39.0–52.0)
Hemoglobin: 11.3 g/dL — ABNORMAL LOW (ref 13.0–17.0)
MCH: 35.4 pg — ABNORMAL HIGH (ref 26.0–34.0)
MCHC: 33.1 g/dL (ref 30.0–36.0)
MCV: 106.9 fL — ABNORMAL HIGH (ref 80.0–100.0)
Platelets: 117 10*3/uL — ABNORMAL LOW (ref 150–400)
RBC: 3.19 MIL/uL — ABNORMAL LOW (ref 4.22–5.81)
RDW: 14.8 % (ref 11.5–15.5)
WBC: 2.2 10*3/uL — ABNORMAL LOW (ref 4.0–10.5)
nRBC: 0.9 % — ABNORMAL HIGH (ref 0.0–0.2)

## 2022-04-03 LAB — HEMOGLOBIN A1C
Hgb A1c MFr Bld: 5.1 % (ref 4.8–5.6)
Mean Plasma Glucose: 100 mg/dL

## 2022-04-03 LAB — GLUCOSE, CAPILLARY
Glucose-Capillary: 153 mg/dL — ABNORMAL HIGH (ref 70–99)
Glucose-Capillary: 172 mg/dL — ABNORMAL HIGH (ref 70–99)
Glucose-Capillary: 197 mg/dL — ABNORMAL HIGH (ref 70–99)
Glucose-Capillary: 210 mg/dL — ABNORMAL HIGH (ref 70–99)

## 2022-04-03 LAB — CULTURE, RESPIRATORY W GRAM STAIN: Culture: NO GROWTH

## 2022-04-03 LAB — MAGNESIUM: Magnesium: 2.2 mg/dL (ref 1.7–2.4)

## 2022-04-03 LAB — ANA W/REFLEX IF POSITIVE: Anti Nuclear Antibody (ANA): NEGATIVE

## 2022-04-03 LAB — PHOSPHORUS: Phosphorus: 2.9 mg/dL (ref 2.5–4.6)

## 2022-04-03 LAB — URIC ACID: Uric Acid, Serum: 8.2 mg/dL (ref 3.7–8.6)

## 2022-04-03 LAB — GLOMERULAR BASEMENT MEMBRANE ANTIBODIES: GBM Ab: <0.2 units (ref 0.0–0.9)

## 2022-04-03 LAB — BETA-HYDROXYBUTYRIC ACID: Beta-Hydroxybutyric Acid: 0.18 mmol/L (ref 0.05–0.27)

## 2022-04-03 MED ORDER — SODIUM CHLORIDE 0.9 % IV SOLN
2.0000 g | INTRAVENOUS | Status: DC
  Administered 2022-04-04 – 2022-04-06 (×3): 2 g via INTRAVENOUS
  Filled 2022-04-03 (×3): qty 20

## 2022-04-03 MED ORDER — INSULIN ASPART 100 UNIT/ML IJ SOLN
0.0000 [IU] | Freq: Three times a day (TID) | INTRAMUSCULAR | Status: DC
  Administered 2022-04-03: 2 [IU] via SUBCUTANEOUS
  Administered 2022-04-04: 1 [IU] via SUBCUTANEOUS
  Administered 2022-04-04: 3 [IU] via SUBCUTANEOUS
  Administered 2022-04-04: 2 [IU] via SUBCUTANEOUS
  Administered 2022-04-05: 1 [IU] via SUBCUTANEOUS
  Administered 2022-04-05: 2 [IU] via SUBCUTANEOUS
  Administered 2022-04-05: 1 [IU] via SUBCUTANEOUS
  Administered 2022-04-06: 2 [IU] via SUBCUTANEOUS
  Administered 2022-04-06 – 2022-04-07 (×2): 1 [IU] via SUBCUTANEOUS
  Filled 2022-04-03 (×10): qty 1

## 2022-04-03 MED ORDER — HYDROCODONE BIT-HOMATROP MBR 5-1.5 MG/5ML PO SOLN
5.0000 mL | Freq: Four times a day (QID) | ORAL | Status: DC | PRN
  Administered 2022-04-03 – 2022-04-07 (×10): 5 mL via ORAL
  Filled 2022-04-03 (×11): qty 5

## 2022-04-03 MED ORDER — LEVALBUTEROL HCL 0.63 MG/3ML IN NEBU
0.6300 mg | INHALATION_SOLUTION | Freq: Three times a day (TID) | RESPIRATORY_TRACT | Status: DC
  Administered 2022-04-03 – 2022-04-07 (×10): 0.63 mg via RESPIRATORY_TRACT
  Filled 2022-04-03 (×10): qty 3

## 2022-04-03 MED ORDER — ORAL CARE MOUTH RINSE: 15.0000 mL | OROMUCOSAL | Status: DC | PRN

## 2022-04-03 MED ORDER — MENTHOL 3 MG MT LOZG
1.0000 | LOZENGE | Freq: Three times a day (TID) | OROMUCOSAL | Status: DC
  Administered 2022-04-03 – 2022-04-07 (×6): 3 mg via ORAL
  Filled 2022-04-03 (×2): qty 9

## 2022-04-03 NOTE — Progress Notes (Signed)
   04/03/22 1204  Assess: MEWS Score  Temp 98.3 F (36.8 C)  BP (!) 158/104  Pulse Rate (!) 107  Resp (!) 26  Level of Consciousness Alert  SpO2 96 %  O2 Device Nasal Cannula  O2 Flow Rate (L/min) 3 L/min  Assess: MEWS Score  MEWS Temp 0  MEWS Systolic 0  MEWS Pulse 1  MEWS RR 2  MEWS LOC 0  MEWS Score 3  MEWS Score Color Yellow  Assess: if the MEWS score is Yellow or Red  Were vital signs taken at a resting state? No (upon transfer to unit)  Focused Assessment No change from prior assessment  Does the patient meet 2 or more of the SIRS criteria? Yes  Does the patient have a confirmed or suspected source of infection? Yes  Provider and Rapid Response Notified? No  MEWS guidelines implemented *See Row Information* No, previously yellow, continue vital signs every 4 hours  Notify: Charge Nurse/RN  Name of Charge Nurse/RN Notified Verl Bangs  Date Charge Nurse/RN Notified 04/03/22  Time Charge Nurse/RN Notified 1204  Document  Patient Outcome Other (Comment) (continue to monitor)  Progress note created (see row info) Yes  Assess: SIRS CRITERIA  SIRS Temperature  0  SIRS Pulse 1  SIRS Respirations  1  SIRS WBC 1  SIRS Score Sum  3    .

## 2022-04-03 NOTE — Consult Note (Signed)
PHARMACY CONSULT NOTE - ELECTROLYTES  Pharmacy Consult for Electrolyte Monitoring and Replacement   Recent Labs: Potassium (mmol/L)  Date Value  04/03/2022 5.1   Magnesium (mg/dL)  Date Value  97/67/3419 2.2   Calcium (mg/dL)  Date Value  37/90/2409 9.6   Albumin (g/dL)  Date Value  73/53/2992 3.4 (L)   Phosphorus (mg/dL)  Date Value  42/68/3419 2.9   Sodium (mmol/L)  Date Value  04/03/2022 139   Corrected Ca: 9.9 mg/dL  Assessment  Curtis Erickson is a 31 y.o. male presenting with SOB, fever. PMH significant for HTN, alcoholic fatty liver, microcytic anemia, IDA, vitamin B12  & folate deficiency, leukopenia, DILI (allopurinol). Pharmacy has been consulted to monitor and replace electrolytes.  Diet: Regular  Goal of Therapy: Electrolytes WNL  Plan:  Potassium: 4.9 >> 5.1, no replacement needed Magnesium: 2.5 >> 2.2, no replacement needed Phosphorus: 2.6 >> 2.9, no replacement needed Pharmacy will sign off and continue to monitor peripherally  Thank you for allowing pharmacy to be a part of this patient's care.  Celene Squibb, PharmD PGY1 Pharmacy Resident 04/03/2022 9:56 AM

## 2022-04-03 NOTE — Progress Notes (Addendum)
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   Per ED reports, EMS was called due to respiratory distress.  Patient's girlfriend reported that patient been complaining of bilateral lower extremity edema, flu-like symptoms with worsening shortness of breath.  On EMS arrival patient was found with RR40-50's, sats 90% on room air.  He was treated with 125 mg of Solu-Medrol, LR 500 mL and DuoNeb x 2.   ED Course: Initial vital signs showed HR of 150 beats/minute, BP 168/75 mm Hg, the RR 28 breaths/minute, and the oxygen saturation 92 % on 3L and a temperature of 103.73F (39.5C).   Patient's condition deteriorated noted to be in acute hypoxic and hypercapnic respiratory failure secondary to influenza A infection and possible superimposed bacterial infection, subsequently intubated and admitted to the critical care service.  Patient improved clinically, extubated, O2 sats improved and transferred to the hospitalist service.   Assessment & Plan:   Principal Problem:   Acute respiratory failure with hypoxia (HCC) Active Problems:   Influenza A   Pneumonia of both lungs due to infectious organism   Respiratory failure with hypoxia (HCC)   Acute metabolic encephalopathy  #1 acute respiratory failure with hypoxia secondary to influenza A pneumonia, multifocal pneumonia -Patient noted to have presented and admitted with acute hypoxic and hypercapnic respiratory failure secondary to influenza A infection and  possible bacterial infection. -Patient admitted, intubated under the critical care service. -CT angiogram chest done with perihilar predominant bilateral groundglass and consolidative opacities nonspecific but can be seen in the setting of multifocal pneumonia, ARDS or pulmonary edema. -Chest x-ray done with interval development of perihilar airspace infiltrate in keeping with perihilar alveolar pulmonary edema multifocal infection. -2D echo done with a EF of 60 to 65%,NWMA. -Patient placed on IV Solu-Medrol for treatment of EVALI. -ANA/ANCA/anti-GBM ordered per critical care service and pending. -Continue current steroids taper as recommended by PCCM. -Continue Mucinex, scheduled nebulizers, Tamiflu, PPI, empiric IV azithromycin and IV Rocephin. -Supportive care.  2.  Influenza A -Continue Tamiflu, bronchodilators, antitussives, steroids.   -Place on Cepacol lozenges.  3.  Acute kidney injury -Patient noted to have presented with mild acute kidney injury per pulmonary. -Patient noted to have had significant proteinuria on presentation and urinalysis, concern for nephrotic syndrome versus vasculitic process versus diabetes induced nephropathy. -Urine studies ordered as well as ANA/ANCA/anti-GBM. -Nephrology consulted with the following. -Continue empiric steroids. -Per nephrology.  4.  Acute metabolic encephalopathy -Likely secondary to problem #1 and #2. -Improved with treatment of problem #1. -Resolved.  5.  History of liver disease/concern for underlying cirrhosis -Patient with thrombocytopenia, liver contour smooth on imaging. -Patient with no overt bleeding.-Follow.  6.  Pancytopenia -Likely secondary to underlying cirrhosis. -Patient with no overt bleeding. -Continue IV antibiotics. -Repeat labs in the AM.   DVT prophylaxis: Heparin Code Status: Full Family Communication: Updated patient, no family at bedside. Disposition: Likely home when clinically  improved.  Status is: Inpatient Remains inpatient appropriate because: Severity of illness   Consultants:  PCCM admission  Procedures:  CT angiogram chest 03/31/2022 CT abdomen and pelvis 03/31/2022 Chest x-ray 03/31/2022, 04/01/2022 2D echo 04/01/2022  Significant Hospital Events: Including procedures, antibiotic start and stop dates in addition to other pertinent events   12/23: Intubated and vented in the ICU 12/24: Extubated to BiPAP 12/25: on nasal cannula  Antimicrobials:  Anti-infectives (From admission, onward)    Start     Dose/Rate Route Frequency Ordered Stop   04/04/22 0200  cefTRIAXone (ROCEPHIN) 2 g in sodium chloride 0.9 % 100 mL IVPB        2 g 200 mL/hr over 30 Minutes Intravenous Every 24 hours 04/03/22 0928     04/01/22 0200  cefTRIAXone (ROCEPHIN) 1 g in sodium chloride 0.9 % 100 mL IVPB  Status:  Discontinued        1 g 200 mL/hr over 30 Minutes Intravenous Every 24 hours 03/31/22 0610 04/03/22 0928   04/01/22 0200  azithromycin (ZITHROMAX) 500 mg in sodium chloride 0.9 % 250 mL IVPB        500 mg 250 mL/hr over 60 Minutes Intravenous Every 24 hours 03/31/22 0610     03/31/22 2200  oseltamivir (TAMIFLU) capsule 75 mg        75 mg Per Tube 2 times daily 03/31/22 1114 04/05/22 2159   03/31/22 0245  oseltamivir (TAMIFLU) capsule 75 mg  Status:  Discontinued        75 mg Oral 2 times daily 03/31/22 0230 03/31/22 1114   03/31/22 0215  oseltamivir (TAMIFLU) capsule 75 mg  Status:  Discontinued        75 mg Oral Daily 03/31/22 0206 03/31/22 0230   03/31/22 0145  cefTRIAXone (ROCEPHIN) 2 g in sodium chloride 0.9 % 100 mL IVPB        2 g 200 mL/hr over 30 Minutes Intravenous  Once 03/31/22 0131 03/31/22 0217   03/31/22 0145  azithromycin (ZITHROMAX) 500 mg in sodium chloride 0.9 % 250 mL IVPB        500 mg 250 mL/hr over 60 Minutes Intravenous  Once 03/31/22 0131 03/31/22 0246         Subjective: States overall shortness of breath is improving.   Complains of cough.  Complains of chest pain associated with cough.  States difficult to take deep breaths.  Tolerating oral intake.  Getting ready to be transferred to the floor from the ICU.  Objective: Vitals:   04/03/22 1100 04/03/22 1204 04/03/22 1328 04/03/22 1535  BP: (!) 154/105 (!) 158/104  (!) 152/90  Pulse: (!) 106 (!) 107  (!) 109  Resp: (!) 29 (!) 26  20  Temp:  98.3 F (36.8 C)  98.4 F (36.9 C)  TempSrc:  Oral  Oral  SpO2: 96% 96% 98% 95%  Weight:      Height:        Intake/Output Summary (Last 24 hours) at 04/03/2022 1832 Last data filed at 04/03/2022 1702 Gross per 24 hour  Intake 350 ml  Output 1275 ml  Net -925 ml   Filed Weights   04/01/22 0500 04/02/22 0157 04/03/22 0342  Weight: 87.2 kg 87.4 kg 87.4 kg    Examination:  General exam: Appears calm and comfortable  Respiratory system: Some scattered coarse breath sounds.  No crackles noted.  No significant wheezing.  Speaking in full sentences. Cardiovascular system: Tachycardia.  No murmurs rubs or gallops.  No JVD.  No lower extremity edema.  Gastrointestinal system: Abdomen is nondistended, soft and nontender. No organomegaly or masses felt. Normal bowel  sounds heard. Central nervous system: Alert and oriented. No focal neurological deficits. Extremities: Symmetric 5 x 5 power. Skin: No rashes, lesions or ulcers Psychiatry: Judgement and insight appear normal. Mood & affect appropriate.     Data Reviewed: I have personally reviewed following labs and imaging studies  CBC: Recent Labs  Lab 03/31/22 0034 03/31/22 0642 04/01/22 0446 04/02/22 0424 04/03/22 0621  WBC 2.3* 1.6* 4.3 2.7* 2.2*  NEUTROABS 1.0*  --  3.8 2.1  --   HGB 12.2* 11.0* 11.1* 11.7* 11.3*  HCT 36.9* 32.6* 34.4* 34.7* 34.1*  MCV 109.5* 108.3* 112.8* 109.1* 106.9*  PLT 138* 88* 107* 118* 117*    Basic Metabolic Panel: Recent Labs  Lab 03/31/22 1648 04/01/22 0446 04/01/22 1704 04/02/22 0424 04/03/22 0621  NA 135 135  137 140 139  K 4.3 4.5 3.7 4.9 5.1  CL 102 102 104 104 105  CO2 16* 22 20* 20* 22  GLUCOSE 165* 217* 142* 176* 156*  BUN 18 31* 36* 42* 41*  CREATININE 1.08 1.22 0.86 0.82 0.73  CALCIUM 8.2* 8.7* 9.2 9.9 9.6  MG 2.3 2.4 2.5* 2.5* 2.2  PHOS 6.1* 5.4* 2.6 2.6 2.9    GFR: Estimated Creatinine Clearance: 145.1 mL/min (by C-G formula based on SCr of 0.73 mg/dL).  Liver Function Tests: Recent Labs  Lab 03/31/22 0034 03/31/22 0941 04/01/22 0440  AST 40 35 35  ALT 19 19 18   ALKPHOS 76 62 50  BILITOT 2.5* 2.2* 1.5*  PROT 7.3 6.8 6.7  ALBUMIN 3.9 3.6 3.4*    CBG: Recent Labs  Lab 04/02/22 1932 04/02/22 2309 04/03/22 0331 04/03/22 0722 04/03/22 1612  GLUCAP 208* 149* 153* 172* 197*     Recent Results (from the past 240 hour(s))  Blood Culture (routine x 2)     Status: None (Preliminary result)   Collection Time: 03/31/22 12:34 AM   Specimen: BLOOD  Result Value Ref Range Status   Specimen Description BLOOD RIGHT ANTECUBITAL  Final   Special Requests   Final    BOTTLES DRAWN AEROBIC AND ANAEROBIC Blood Culture results may not be optimal due to an excessive volume of blood received in culture bottles   Culture   Final    NO GROWTH 3 DAYS Performed at [facility], [address], [place], [place] [address]    Report Status PENDING  Incomplete  Resp panel by RT-PCR (RSV, Flu A&B, Covid) Anterior Nasal Swab     Status: Abnormal   Collection Time: 03/31/22 12:34 AM   Specimen: Anterior Nasal Swab  Result Value Ref Range Status   SARS Coronavirus 2 by RT PCR NEGATIVE NEGATIVE Final    Comment: (NOTE) SARS-CoV-2 target nucleic acids are NOT DETECTED.  The SARS-CoV-2 RNA is generally detectable in upper respiratory specimens during the acute phase of infection. The lowest concentration of SARS-CoV-2 viral copies this assay can detect is 138 copies/mL. A negative result does not preclude SARS-Cov-2 infection and should not be used as the sole basis for  treatment or other patient management decisions. A negative result may occur with  improper specimen collection/handling, submission of specimen other than nasopharyngeal swab, presence of viral mutation(s) within the areas targeted by this assay, and inadequate number of viral copies(<138 copies/mL). A negative result must be combined with clinical observations, patient history, and epidemiological information. The expected result is Negative.  Fact Sheet for Patients:  EntrepreneurPulse.com.au  Fact Sheet for Healthcare Providers:  IncredibleEmployment.be  This test is no t yet approved or  cleared by the [place] and  has been authorized for detection and/or diagnosis of SARS-CoV-2 by FDA under an Emergency Use Authorization (EUA). This EUA will remain  in effect (meaning this test can be used) for the duration of the COVID-19 declaration under Section 564(b)(1) of the Act, 21 U.S.C.section 360bbb-3(b)(1), unless the authorization is terminated  or revoked sooner.       Influenza A by PCR POSITIVE (A) NEGATIVE Final   Influenza B by PCR NEGATIVE NEGATIVE Final    Comment: (NOTE) The Xpert Xpress SARS-CoV-2/FLU/RSV plus assay is intended as an aid in the diagnosis of influenza from Nasopharyngeal swab specimens and should not be used as a sole basis for treatment. Nasal washings and aspirates are unacceptable for Xpert Xpress SARS-CoV-2/FLU/RSV testing.  Fact Sheet for Patients: BloggerCourse.com  Fact Sheet for Healthcare Providers: SeriousBroker.it  This test is not yet approved or cleared by the [place] FDA and has been authorized for detection and/or diagnosis of SARS-CoV-2 by FDA under an Emergency Use Authorization (EUA). This EUA will remain in effect (meaning this test can be used) for the duration of the COVID-19 declaration under Section 564(b)(1) of the Act, 21  U.S.C. section 360bbb-3(b)(1), unless the authorization is terminated or revoked.     Resp Syncytial Virus by PCR NEGATIVE NEGATIVE Final    Comment: (NOTE) Fact Sheet for Patients: BloggerCourse.com  Fact Sheet for Healthcare Providers: SeriousBroker.it  This test is not yet approved or cleared by the [place] FDA and has been authorized for detection and/or diagnosis of SARS-CoV-2 by FDA under an Emergency Use Authorization (EUA). This EUA will remain in effect (meaning this test can be used) for the duration of the COVID-19 declaration under Section 564(b)(1) of the Act, 21 U.S.C. section 360bbb-3(b)(1), unless the authorization is terminated or revoked.  Performed at [facility], [address] [place], [place] [address]   Blood Culture (routine x 2)     Status: None (Preliminary result)   Collection Time: 03/31/22 12:55 AM   Specimen: BLOOD  Result Value Ref Range Status   Specimen Description BLOOD LEFT ANTECUBITAL  Final   Special Requests   Final    BOTTLES DRAWN AEROBIC AND ANAEROBIC Blood Culture results may not be optimal due to an excessive volume of blood received in culture bottles   Culture   Final    NO GROWTH 3 DAYS Performed at [facility], [address] [place], [place] [address]    Report Status PENDING  Incomplete  Urine Culture     Status: None   Collection Time: 03/31/22  2:59 AM   Specimen: Urine, Clean Catch  Result Value Ref Range Status   Specimen Description   Final    URINE, CLEAN CATCH Performed at [facility], [address], [place], [place] [address]    Special Requests   Final    NONE Performed at [facility], [address] [place], [place] [address]    Culture   Final    NO GROWTH Performed at [facility], [address], [place], [place] [address]    Report Status 04/01/2022 FINAL  Final  Respiratory (~20 pathogens)  panel by PCR     Status: Abnormal   Collection Time: 03/31/22  2:59 AM   Specimen: Urine, Clean Catch; Respiratory  Result Value Ref Range Status   Adenovirus NOT DETECTED NOT DETECTED Final   Coronavirus 229E NOT DETECTED NOT DETECTED Final    Comment: (  NOTE) The Coronavirus on the Respiratory Panel, DOES NOT test for the novel  Coronavirus (2019 nCoV)    Coronavirus HKU1 NOT DETECTED NOT DETECTED Final   Coronavirus NL63 NOT DETECTED NOT DETECTED Final   Coronavirus OC43 NOT DETECTED NOT DETECTED Final   Metapneumovirus NOT DETECTED NOT DETECTED Final   Rhinovirus / Enterovirus NOT DETECTED NOT DETECTED Final   Influenza A H1 2009 DETECTED (A) NOT DETECTED Final   Influenza B NOT DETECTED NOT DETECTED Final   Parainfluenza Virus 1 NOT DETECTED NOT DETECTED Final   Parainfluenza Virus 2 NOT DETECTED NOT DETECTED Final   Parainfluenza Virus 3 NOT DETECTED NOT DETECTED Final   Parainfluenza Virus 4 NOT DETECTED NOT DETECTED Final   Respiratory Syncytial Virus NOT DETECTED NOT DETECTED Final   Bordetella pertussis NOT DETECTED NOT DETECTED Final   Bordetella Parapertussis NOT DETECTED NOT DETECTED Final   Chlamydophila pneumoniae NOT DETECTED NOT DETECTED Final   Mycoplasma pneumoniae NOT DETECTED NOT DETECTED Final    Comment: Performed at [facility], [address] [place], [place] [address]  MRSA Next Gen by PCR, Nasal     Status: None   Collection Time: 03/31/22  4:45 AM   Specimen: Nasal Mucosa; Nasal Swab  Result Value Ref Range Status   MRSA by PCR Next Gen NOT DETECTED NOT DETECTED Final    Comment: (NOTE) The GeneXpert MRSA Assay (FDA approved for NASAL specimens only), is one component of a comprehensive MRSA colonization surveillance program. It is not intended to diagnose MRSA infection nor to guide or monitor treatment for MRSA infections. Test performance is not FDA approved in patients less than 39 years old. Performed at [facility], [address] [place], [place] [address]   Culture, Respiratory w Gram Stain     Status: None   Collection Time: 04/01/22 11:23 AM   Specimen: Tracheal Aspirate; Respiratory  Result Value Ref Range Status   Specimen Description   Final    TRACHEAL ASPIRATE Performed at [facility], [address] [place], [place] [address]    Special Requests   Final    NONE Performed at [facility], [address] [place], [place] [address]    Gram Stain   Final    MODERATE WBC PRESENT, PREDOMINANTLY PMN NO ORGANISMS SEEN    Culture   Final    NO GROWTH 2 DAYS Performed at [facility], [address], [place], [place] [address]    Report Status 04/03/2022 FINAL  Final         Radiology Studies: No results found.      Scheduled Meds:  Chlorhexidine Gluconate Cloth  6 each Topical Daily   dextromethorphan-guaiFENesin  1 tablet Oral BID   heparin  5,000 Units Subcutaneous Q8H   insulin aspart  0-9 Units Subcutaneous TID WC   levalbuterol  0.63 mg Nebulization TID   menthol-cetylpyridinium  1 lozenge Oral TID   methylPREDNISolone (SOLU-MEDROL) injection  40 mg Intravenous BID   oseltamivir  75 mg Per Tube BID   pantoprazole (PROTONIX) IV  40 mg Intravenous Q24H   Continuous Infusions:  azithromycin Stopped (04/03/22 0244)   [START ON 04/04/2022] cefTRIAXone (ROCEPHIN)  IV       LOS: 3 days    Time spent: 40 minutes    Ramiro Harvest, MD [facility]   To contact the attending provider between 7A-7P or the covering provider during after hours 7P-7A, please log into the web site  [email or web address] and access using universal [place] password for that web site. If you do not have the password, please call the hospital operator.  04/03/2022, 6:32 PM

## 2022-04-03 NOTE — Progress Notes (Signed)
[facility]  ROUNDING NOTE   Subjective:   Patient seen and evaluated at bedside in ICU Alert and oriented Remains on 2L [place] Reports appropriate appetite.   Objective:  Vital signs in last 24 hours:  Temp:  [97.8 F (36.6 C)-98.8 F (37.1 C)] 97.8 F (36.6 C) (12/26 0800) Pulse Rate:  [104-118] 110 (12/26 0900) Resp:  [22-37] 34 (12/26 0900) BP: (128-165)/(70-101) 158/97 (12/26 0900) SpO2:  [87 %-100 %] 93 % (12/26 0900) Weight:  [87.4 kg] 87.4 kg (12/26 0342)  Weight change: 0 kg Filed Weights   04/01/22 0500 04/02/22 0157 04/03/22 0342  Weight: 87.2 kg 87.4 kg 87.4 kg    Intake/Output: I/O last 3 completed shifts: In: 2320 [P.O.:1970; IV Piggyback:350] Out: 2725 [Urine:2725]   Intake/Output this shift:  Total I/O In: 350 [IV Piggyback:350] Out: -   Physical Exam: General: NAD  Head: Normocephalic, atraumatic. Moist oral mucosal membranes  Eyes: Anicteric  Lungs:  Coarse with basilar crackles,  O2  Heart: Regular rate and rhythm  Abdomen:  Soft, nontender  Extremities:  1+ peripheral edema.  Neurologic: Alert and oriented, moving all four extremities  Skin: No lesions  Access: None    Basic Metabolic Panel: Recent Labs  Lab 03/31/22 1648 04/01/22 0446 04/01/22 1704 04/02/22 0424 04/03/22 0621  NA 135 135 137 140 139  K 4.3 4.5 3.7 4.9 5.1  CL 102 102 104 104 105  CO2 16* 22 20* 20* 22  GLUCOSE 165* 217* 142* 176* 156*  BUN 18 31* 36* 42* 41*  CREATININE 1.08 1.22 0.86 0.82 0.73  CALCIUM 8.2* 8.7* 9.2 9.9 9.6  MG 2.3 2.4 2.5* 2.5* 2.2  PHOS 6.1* 5.4* 2.6 2.6 2.9    Liver Function Tests: Recent Labs  Lab 03/31/22 0034 03/31/22 0941 04/01/22 0440  AST 40 35 35  ALT ALKPHOS 76 62 50  BILITOT 2.5* 2.2* 1.5*  PROT 7.3 6.8 6.7  ALBUMIN 3.9 3.6 3.4*   No results for input(s): "LIPASE", "AMYLASE" in the last 168 hours. No results for input(s): "AMMONIA" in the last 168 hours.  CBC: Recent Labs  Lab  03/31/22 0034 03/31/22 0642 04/01/22 0446 04/02/22 0424 04/03/22 0621  WBC 2.3* 1.6* 4.3 2.7* 2.2*  NEUTROABS 1.0*  --  3.8 2.1  --   HGB 12.2* 11.0* 11.1* 11.7* 11.3*  HCT 36.9* 32.6* 34.4* 34.7* 34.1*  MCV 109.5* 108.3* 112.8* 109.1* 106.9*  PLT 138* 88* 107* 118* 117*    Cardiac Enzymes: No results for input(s): "CKTOTAL", "CKMB", "CKMBINDEX", "TROPONINI" in the last 168 hours.  BNP: Invalid input(s): "POCBNP"  CBG: Recent Labs  Lab 04/02/22 1617 04/02/22 1932 04/02/22 2309 04/03/22 0331 04/03/22 0722  GLUCAP 184* 208* 149* 153* 172*    Microbiology: Results for orders placed or performed during the hospital encounter of 03/31/22  Blood Culture (routine x 2)     Status: None (Preliminary result)   Collection Time: 03/31/22 12:34 AM   Specimen: BLOOD  Result Value Ref Range Status   Specimen Description BLOOD RIGHT ANTECUBITAL  Final   Special Requests   Final    BOTTLES DRAWN AEROBIC AND ANAEROBIC Blood Culture results may not be optimal due to an excessive volume of blood received in culture bottles   Culture   Final    NO GROWTH 3 DAYS Performed at [facility], [address] [place], [place] [address]    Report Status PENDING  Incomplete  Resp panel by RT-PCR (RSV, Flu  A&B, Covid) Anterior Nasal Swab     Status: Abnormal   Collection Time: 03/31/22 12:34 AM   Specimen: Anterior Nasal Swab  Result Value Ref Range Status   SARS Coronavirus 2 by RT PCR NEGATIVE NEGATIVE Final    Comment: (NOTE) SARS-CoV-2 target nucleic acids are NOT DETECTED.  The SARS-CoV-2 RNA is generally detectable in upper respiratory specimens during the acute phase of infection. The lowest concentration of SARS-CoV-2 viral copies this assay can detect is 138 copies/mL. A negative result does not preclude SARS-Cov-2 infection and should not be used as the sole basis for treatment or other patient management decisions. A negative result may occur with  improper specimen  collection/handling, submission of specimen other than nasopharyngeal swab, presence of viral mutation(s) within the areas targeted by this assay, and inadequate number of viral copies(<138 copies/mL). A negative result must be combined with clinical observations, patient history, and epidemiological information. The expected result is Negative.  Fact Sheet for Patients:  BloggerCourse.com  Fact Sheet for Healthcare Providers:  SeriousBroker.it  This test is no t yet approved or cleared by the [place] FDA and  has been authorized for detection and/or diagnosis of SARS-CoV-2 by FDA under an Emergency Use Authorization (EUA). This EUA will remain  in effect (meaning this test can be used) for the duration of the COVID-19 declaration under Section 564(b)(1) of the Act, 21 U.S.C.section 360bbb-3(b)(1), unless the authorization is terminated  or revoked sooner.       Influenza A by PCR POSITIVE (A) NEGATIVE Final   Influenza B by PCR NEGATIVE NEGATIVE Final    Comment: (NOTE) The Xpert Xpress SARS-CoV-2/FLU/RSV plus assay is intended as an aid in the diagnosis of influenza from Nasopharyngeal swab specimens and should not be used as a sole basis for treatment. Nasal washings and aspirates are unacceptable for Xpert Xpress SARS-CoV-2/FLU/RSV testing.  Fact Sheet for Patients: BloggerCourse.com  Fact Sheet for Healthcare Providers: SeriousBroker.it  This test is not yet approved or cleared by the [place] FDA and has been authorized for detection and/or diagnosis of SARS-CoV-2 by FDA under an Emergency Use Authorization (EUA). This EUA will remain in effect (meaning this test can be used) for the duration of the COVID-19 declaration under Section 564(b)(1) of the Act, 21 U.S.C. section 360bbb-3(b)(1), unless the authorization is terminated or revoked.     Resp Syncytial  Virus by PCR NEGATIVE NEGATIVE Final    Comment: (NOTE) Fact Sheet for Patients: BloggerCourse.com  Fact Sheet for Healthcare Providers: SeriousBroker.it  This test is not yet approved or cleared by the [place] FDA and has been authorized for detection and/or diagnosis of SARS-CoV-2 by FDA under an Emergency Use Authorization (EUA). This EUA will remain in effect (meaning this test can be used) for the duration of the COVID-19 declaration under Section 564(b)(1) of the Act, 21 U.S.C. section 360bbb-3(b)(1), unless the authorization is terminated or revoked.  Performed at [facility], [address] [place], [place] [address]   Blood Culture (routine x 2)     Status: None (Preliminary result)   Collection Time: 03/31/22 12:55 AM   Specimen: BLOOD  Result Value Ref Range Status   Specimen Description BLOOD LEFT ANTECUBITAL  Final   Special Requests   Final    BOTTLES DRAWN AEROBIC AND ANAEROBIC Blood Culture results may not be optimal due to an excessive volume of blood received in culture bottles   Culture   Final    NO GROWTH 3 DAYS Performed  at [facility], [address] [place], [place] [address]    Report Status PENDING  Incomplete  Urine Culture     Status: None   Collection Time: 03/31/22  2:59 AM   Specimen: Urine, Clean Catch  Result Value Ref Range Status   Specimen Description   Final    URINE, CLEAN CATCH Performed at [facility], [address] [place], [place] [address]    Special Requests   Final    NONE Performed at [facility], [address] [place], [place] [address]    Culture   Final    NO GROWTH Performed at [facility], [address] [place], [place] [address]    Report Status 04/01/2022 FINAL  Final  Respiratory (~20 pathogens) panel by PCR     Status: Abnormal   Collection Time: 03/31/22  2:59 AM   Specimen: Urine, Clean Catch;  Respiratory  Result Value Ref Range Status   Adenovirus NOT DETECTED NOT DETECTED Final   Coronavirus 229E NOT DETECTED NOT DETECTED Final    Comment: (NOTE) The Coronavirus on the Respiratory Panel, DOES NOT test for the novel  Coronavirus (2019 nCoV)    Coronavirus HKU1 NOT DETECTED NOT DETECTED Final   Coronavirus NL63 NOT DETECTED NOT DETECTED Final   Coronavirus OC43 NOT DETECTED NOT DETECTED Final   Metapneumovirus NOT DETECTED NOT DETECTED Final   Rhinovirus / Enterovirus NOT DETECTED NOT DETECTED Final   Influenza A H1 2009 DETECTED (A) NOT DETECTED Final   Influenza B NOT DETECTED NOT DETECTED Final   Parainfluenza Virus 1 NOT DETECTED NOT DETECTED Final   Parainfluenza Virus 2 NOT DETECTED NOT DETECTED Final   Parainfluenza Virus 3 NOT DETECTED NOT DETECTED Final   Parainfluenza Virus 4 NOT DETECTED NOT DETECTED Final   Respiratory Syncytial Virus NOT DETECTED NOT DETECTED Final   Bordetella pertussis NOT DETECTED NOT DETECTED Final   Bordetella Parapertussis NOT DETECTED NOT DETECTED Final   Chlamydophila pneumoniae NOT DETECTED NOT DETECTED Final   Mycoplasma pneumoniae NOT DETECTED NOT DETECTED Final    Comment: Performed at [facility], [address], [place], [place] [address]  MRSA Next Gen by PCR, Nasal     Status: None   Collection Time: 03/31/22  4:45 AM   Specimen: Nasal Mucosa; Nasal Swab  Result Value Ref Range Status   MRSA by PCR Next Gen NOT DETECTED NOT DETECTED Final    Comment: (NOTE) The GeneXpert MRSA Assay (FDA approved for NASAL specimens only), is one component of a comprehensive MRSA colonization surveillance program. It is not intended to diagnose MRSA infection nor to guide or monitor treatment for MRSA infections. Test performance is not FDA approved in patients less than 74 years old. Performed at [facility], [address] [place], [place] [address]   Culture, Respiratory w Gram Stain     Status: None (Preliminary  result)   Collection Time: 04/01/22 11:23 AM   Specimen: Tracheal Aspirate; Respiratory  Result Value Ref Range Status   Specimen Description   Final    TRACHEAL ASPIRATE Performed at [facility], [address], [place], [place] [address]    Special Requests   Final    NONE Performed at [facility], [address] [place], [place] [address]    Gram Stain   Final    MODERATE WBC PRESENT, PREDOMINANTLY PMN NO ORGANISMS SEEN    Culture   Final    NO GROWTH <  24 HOURS Performed at [facility], [address] [place], [place] [address]    Report Status PENDING  Incomplete    Coagulation Studies: No results for input(s): "LABPROT", "INR" in the last 72 hours.  Urinalysis: No results for input(s): "COLORURINE", "LABSPEC", "PHURINE", "GLUCOSEU", "HGBUR", "BILIRUBINUR", "KETONESUR", "PROTEINUR", "UROBILINOGEN", "NITRITE", "LEUKOCYTESUR" in the last 72 hours.  Invalid input(s): "APPERANCEUR"    Imaging: ECHOCARDIOGRAM COMPLETE  Result Date: 04/02/2022    ECHOCARDIOGRAM REPORT   Patient Name:   Curtis Erickson Date of Exam: 04/01/2022 Medical Rec #:  [id number]      Height:       68.0 in Accession #:    [id number]     Weight:       192.2 lb Date of Birth:  Sep 25, 1990      BSA:          2.010 m Patient Age:    31 years       BP:           133/59 mmHg Patient Gender: M              HR:           116 bpm. Exam Location:  [facility] Procedure: 2D Echo, Color Doppler and Cardiac Doppler Indications:     I50.31 congestive heart failure-Acute Diastolic  History:         Patient has prior history of Echocardiogram examinations, most                  recent 03/10/2021. Gout; Risk Factors:Hypertension.  Sonographer:     Humphrey RollsJoan Heiss Referring Phys:  [id number] Hubbard HartshornELIZABETH ACHIENG OUMA Diagnosing Phys: Debbe OdeaBrian Agbor-Etang MD  Sonographer Comments: Image acquisition challenging due to respiratory motion. IMPRESSIONS  1. Left ventricular ejection fraction, by estimation, is 60 to 65%. The  left ventricle has normal function. The left ventricle has no regional wall motion abnormalities. Left ventricular diastolic parameters were normal.  2. Right ventricular systolic function is normal. The right ventricular size is normal.  3. The mitral valve is normal in structure. Trivial mitral valve regurgitation.  4. The aortic valve is normal in structure. Aortic valve regurgitation is not visualized.  5. The inferior vena cava is dilated in size with <50% respiratory variability, suggesting right atrial pressure of 15 mmHg. FINDINGS  Left Ventricle: Left ventricular ejection fraction, by estimation, is 60 to 65%. The left ventricle has normal function. The left ventricle has no regional wall motion abnormalities. The left ventricular internal cavity size was normal in size. There is  no left ventricular hypertrophy. Left ventricular diastolic parameters were normal. Right Ventricle: The right ventricular size is normal. No increase in right ventricular wall thickness. Right ventricular systolic function is normal. Left Atrium: Left atrial size was normal in size. Right Atrium: Right atrial size was normal in size. Pericardium: There is no evidence of pericardial effusion. Mitral Valve: The mitral valve is normal in structure. Trivial mitral valve regurgitation. Tricuspid Valve: The tricuspid valve is normal in structure. Tricuspid valve regurgitation is mild. Aortic Valve: The aortic valve is normal in structure. Aortic valve regurgitation is not visualized. Aortic valve mean gradient measures 9.0 mmHg. Aortic valve peak gradient measures 19.9 mmHg. Aortic valve area, by VTI measures 2.75 cm. Pulmonic Valve: The pulmonic valve was normal in structure. Pulmonic valve regurgitation is mild. Aorta: The aortic root and ascending aorta are structurally normal, with no evidence of dilitation. Venous: The inferior vena cava is dilated in size with  less than 50% respiratory variability, suggesting right atrial  pressure of 15 mmHg. IAS/Shunts: No atrial level shunt detected by color flow Doppler.  LEFT VENTRICLE PLAX 2D LVIDd:         5.20 cm   Diastology LVIDs:         2.90 cm   LV e' medial:    10.60 cm/s LV PW:         1.20 cm   LV E/e' medial:  11.6 LV IVS:        0.80 cm   LV e' lateral:   15.00 cm/s LVOT diam:     2.10 cm   LV E/e' lateral: 8.2 LV SV:         91 LV SV Index:   45 LVOT Area:     3.46 cm  RIGHT VENTRICLE RV Basal diam:  3.10 cm RV Mid diam:    3.90 cm RV S prime:     13.70 cm/s LEFT ATRIUM             Index        RIGHT ATRIUM           Index LA diam:        4.40 cm 2.19 cm/m   RA Area:     22.90 cm LA Vol (A2C):   57.4 ml 28.56 ml/m  RA Volume:   65.90 ml  32.79 ml/m LA Vol (A4C):   47.7 ml 23.74 ml/m LA Biplane Vol: 52.8 ml 26.27 ml/m  AORTIC VALVE                     PULMONIC VALVE AV Area (Vmax):    2.52 cm      PV Vmax:       1.29 m/s AV Area (Vmean):   3.12 cm      PV Vmean:      88.400 cm/s AV Area (VTI):     2.75 cm      PV VTI:        0.201 m AV Vmax:           223.00 cm/s   PV Peak grad:  6.7 mmHg AV Vmean:          132.000 cm/s  PV Mean grad:  4.0 mmHg AV VTI:            0.331 m AV Peak Grad:      19.9 mmHg AV Mean Grad:      9.0 mmHg LVOT Vmax:         162.00 cm/s LVOT Vmean:        119.000 cm/s LVOT VTI:          0.263 m LVOT/AV VTI ratio: 0.79  AORTA Ao Root diam: 3.00 cm MITRAL VALVE MV Area (PHT): 3.90 cm     SHUNTS MV Decel Time: 194 msec     Systemic VTI:  0.26 m MV E velocity: 123.33 cm/s  Systemic Diam: 2.10 cm Debbe Odea MD Electronically signed by Debbe Odea MD Signature Date/Time: 04/02/2022/1:49:26 PM    Final      Medications:    azithromycin Stopped (04/03/22 0244)   Melene Muller ON 04/04/2022] cefTRIAXone (ROCEPHIN)  IV      Chlorhexidine Gluconate Cloth  6 each Topical Daily   dextromethorphan-guaiFENesin  1 tablet Oral BID   heparin  5,000 Units Subcutaneous Q8H   insulin aspart  0-9 Units Subcutaneous Q4H   levalbuterol  0.63 mg Nebulization  BID  methylPREDNISolone (SOLU-MEDROL) injection  40 mg Intravenous BID   oseltamivir  75 mg Per Tube BID   pantoprazole (PROTONIX) IV  40 mg Intravenous Q24H   docusate, mouth rinse, polyethylene glycol  Assessment/ Plan:  Curtis Erickson is a 31 y.o.  male with medical problems  was admitted on 03/31/2022 for : ARDS (adult respiratory distress syndrome) (HCC) [J80] Influenza A [J10.1] Acute respiratory failure with hypoxia (HCC) [J96.01] Respiratory failure with hypoxia (HCC) [J96.91]   #Proteinuria #Hyperglycemia #Acute kidney injury with creatinine increased from 0.67-1.22, now back to 0.82.  Likely from IV contrast exposure. #Anemia #Leukopenia #Thrombocytopenia # ARDS, acute respiratory failure, influenza A positive  Plan: Acceptable urine output 1.7L recorded. Urine protein to creatinine ratio 0.85. Currently awaiting autoimmune workup. Continue supportive care.     LOS: 3 Curtis Erickson 12/26/202310:49 AM

## 2022-04-04 DIAGNOSIS — J189 Pneumonia, unspecified organism: Secondary | ICD-10-CM

## 2022-04-04 LAB — ANCA PROFILE
Anti-MPO Antibodies: <0.2 units (ref 0.0–0.9)
Anti-PR3 Antibodies: <0.2 units (ref 0.0–0.9)
Atypical P-ANCA titer: <1:20 {titer}
C-ANCA: <1:20 {titer}
P-ANCA: <1:20 {titer}

## 2022-04-04 LAB — CBC WITH DIFFERENTIAL/PLATELET
Abs Immature Granulocytes: 0.03 10*3/uL (ref 0.00–0.07)
Basophils Absolute: 0 10*3/uL (ref 0.0–0.1)
Basophils Relative: 1 %
Eosinophils Absolute: 0 10*3/uL (ref 0.0–0.5)
Eosinophils Relative: 0 %
HCT: 37.4 % — ABNORMAL LOW (ref 39.0–52.0)
Hemoglobin: 12.7 g/dL — ABNORMAL LOW (ref 13.0–17.0)
Immature Granulocytes: 2 %
Lymphocytes Relative: 27 %
Lymphs Abs: 0.6 10*3/uL — ABNORMAL LOW (ref 0.7–4.0)
MCH: 35.6 pg — ABNORMAL HIGH (ref 26.0–34.0)
MCHC: 34 g/dL (ref 30.0–36.0)
MCV: 104.8 fL — ABNORMAL HIGH (ref 80.0–100.0)
Monocytes Absolute: 0.1 10*3/uL (ref 0.1–1.0)
Monocytes Relative: 4 %
Neutro Abs: 1.4 10*3/uL — ABNORMAL LOW (ref 1.7–7.7)
Neutrophils Relative %: 66 %
Platelets: 129 10*3/uL — ABNORMAL LOW (ref 150–400)
RBC: 3.57 MIL/uL — ABNORMAL LOW (ref 4.22–5.81)
RDW: 14.5 % (ref 11.5–15.5)
WBC: 2 10*3/uL — ABNORMAL LOW (ref 4.0–10.5)
nRBC: 1.5 % — ABNORMAL HIGH (ref 0.0–0.2)

## 2022-04-04 LAB — BASIC METABOLIC PANEL
Anion gap: 12 (ref 5–15)
BUN: 42 mg/dL — ABNORMAL HIGH (ref 6–20)
CO2: 21 mmol/L — ABNORMAL LOW (ref 22–32)
Calcium: 9.6 mg/dL (ref 8.9–10.3)
Chloride: 105 mmol/L (ref 98–111)
Creatinine, Ser: 0.81 mg/dL (ref 0.61–1.24)
GFR, Estimated: >60 mL/min (ref >60)
Glucose, Bld: 177 mg/dL — ABNORMAL HIGH (ref 70–99)
Potassium: 5.1 mmol/L (ref 3.5–5.1)
Sodium: 138 mmol/L (ref 135–145)

## 2022-04-04 LAB — HEMOGLOBIN A1C
Hgb A1c MFr Bld: 5.2 % (ref 4.8–5.6)
Mean Plasma Glucose: 103 mg/dL

## 2022-04-04 LAB — MAGNESIUM: Magnesium: 2.2 mg/dL (ref 1.7–2.4)

## 2022-04-04 LAB — C4 COMPLEMENT: Complement C4, Body Fluid: 24 mg/dL (ref 12–38)

## 2022-04-04 LAB — GLUCOSE, CAPILLARY
Glucose-Capillary: 178 mg/dL — ABNORMAL HIGH (ref 70–99)
Glucose-Capillary: 144 mg/dL — ABNORMAL HIGH (ref 70–99)
Glucose-Capillary: 222 mg/dL — ABNORMAL HIGH (ref 70–99)
Glucose-Capillary: 206 mg/dL — ABNORMAL HIGH (ref 70–99)

## 2022-04-04 LAB — C3 COMPLEMENT: C3 Complement: 136 mg/dL (ref 82–167)

## 2022-04-04 MED ORDER — ENSURE ENLIVE PO LIQD
237.0000 mL | Freq: Two times a day (BID) | ORAL | Status: DC
  Administered 2022-04-05: 237 mL via ORAL

## 2022-04-04 MED ORDER — GUAIFENESIN ER 600 MG PO TB12
1200.0000 mg | ORAL_TABLET | Freq: Two times a day (BID) | ORAL | Status: DC
  Administered 2022-04-04 – 2022-04-07 (×7): 1200 mg via ORAL
  Filled 2022-04-04 (×7): qty 2

## 2022-04-04 MED ORDER — IPRATROPIUM BROMIDE 0.02 % IN SOLN
0.5000 mg | Freq: Three times a day (TID) | RESPIRATORY_TRACT | Status: DC
  Administered 2022-04-04 – 2022-04-07 (×6): 0.5 mg via RESPIRATORY_TRACT
  Filled 2022-04-04 (×6): qty 2.5

## 2022-04-04 MED ORDER — ADULT MULTIVITAMIN W/MINERALS CH
1.0000 | ORAL_TABLET | Freq: Every day | ORAL | Status: DC
  Administered 2022-04-04 – 2022-04-07 (×4): 1 via ORAL
  Filled 2022-04-04 (×4): qty 1

## 2022-04-04 NOTE — Progress Notes (Signed)
[facility]  ROUNDING NOTE   Subjective:   Patient seen resting in bed Alert and oriented Appetite remains appropriate, denies nausea vomiting Remains on 3 L nasal cannula  Objective:  Vital signs in last 24 hours:  Temp:  [97.6 F (36.4 C)-98.5 F (36.9 C)] 98 F (36.7 C) (12/27 0809) Pulse Rate:  [100-110] 101 (12/27 1322) Resp:  [16-20] 18 (12/27 1322) BP: (152-165)/(90-108) 161/105 (12/27 1133) SpO2:  [93 %-97 %] 93 % (12/27 1322) Weight:  [92 kg] 92 kg (12/27 0258)  Weight change: 4.6 kg Filed Weights   04/02/22 0157 04/03/22 0342 04/04/22 0258  Weight: 87.4 kg 87.4 kg 92 kg    Intake/Output: I/O last 3 completed shifts: In: 52 [P.O.:240; IV Piggyback:350] Out: 2774 [Urine:1675]   Intake/Output this shift:  Total I/O In: -  Out: 1250 [Urine:1250]  Physical Exam: General: NAD  Head: Normocephalic, atraumatic. Moist oral mucosal membranes  Eyes: Anicteric  Lungs:  Clear to auscultation, [place] O2  Heart: Regular rate and rhythm  Abdomen:  Soft, nontender  Extremities:  trace peripheral edema.  Neurologic: Alert and oriented, moving all four extremities  Skin: No lesions  Access: None    Basic Metabolic Panel: Recent Labs  Lab 03/31/22 1648 04/01/22 0446 04/01/22 1704 04/02/22 0424 04/03/22 0621 04/04/22 0704  NA 135 135 137 140 139 138  K 4.3 4.5 3.7 4.9 5.1 5.1  CL 102 102 104 104 105 105  CO2 16* 22 20* 20* 22 21*  GLUCOSE 165* 217* 142* 176* 156* 177*  BUN 18 31* 36* 42* 41* 42*  CREATININE 1.08 1.22 0.86 0.82 0.73 0.81  CALCIUM 8.2* 8.7* 9.2 9.9 9.6 9.6  MG 2.3 2.4 2.5* 2.5* 2.2 2.2  PHOS 6.1* 5.4* 2.6 2.6 2.9  --      Liver Function Tests: Recent Labs  Lab 03/31/22 0034 03/31/22 0941 04/01/22 0440  AST 40 35 35  ALT 19 19 18   ALKPHOS 76 62 50  BILITOT 2.5* 2.2* 1.5*  PROT 7.3 6.8 6.7  ALBUMIN 3.9 3.6 3.4*    No results for input(s): "LIPASE", "AMYLASE" in the last 168 hours. No results for input(s): "AMMONIA"  in the last 168 hours.  CBC: Recent Labs  Lab 03/31/22 0034 03/31/22 1287 04/01/22 0446 04/02/22 0424 04/03/22 0621 04/04/22 0704  WBC 2.3* 1.6* 4.3 2.7* 2.2* 2.0*  NEUTROABS 1.0*  --  3.8 2.1  --  1.4*  HGB 12.2* 11.0* 11.1* 11.7* 11.3* 12.7*  HCT 36.9* 32.6* 34.4* 34.7* 34.1* 37.4*  MCV 109.5* 108.3* 112.8* 109.1* 106.9* 104.8*  PLT 138* 88* 107* 118* 117* 129*     Cardiac Enzymes: No results for input(s): "CKTOTAL", "CKMB", "CKMBINDEX", "TROPONINI" in the last 168 hours.  BNP: Invalid input(s): "POCBNP"  CBG: Recent Labs  Lab 04/03/22 0722 04/03/22 1612 04/03/22 2130 04/04/22 0815 04/04/22 1135  GLUCAP 172* 197* 210* 178* 144*     Microbiology: Results for orders placed or performed during the hospital encounter of 03/31/22  Blood Culture (routine x 2)     Status: None (Preliminary result)   Collection Time: 03/31/22 12:34 AM   Specimen: BLOOD  Result Value Ref Range Status   Specimen Description BLOOD RIGHT ANTECUBITAL  Final   Special Requests   Final    BOTTLES DRAWN AEROBIC AND ANAEROBIC Blood Culture results may not be optimal due to an excessive volume of blood received in culture bottles   Culture   Final    NO GROWTH 4 DAYS Performed at  [facility], [address], [place], [place] [address]    Report Status PENDING  Incomplete  Resp panel by RT-PCR (RSV, Flu A&B, Covid) Anterior Nasal Swab     Status: Abnormal   Collection Time: 03/31/22 12:34 AM   Specimen: Anterior Nasal Swab  Result Value Ref Range Status   SARS Coronavirus 2 by RT PCR NEGATIVE NEGATIVE Final    Comment: (NOTE) SARS-CoV-2 target nucleic acids are NOT DETECTED.  The SARS-CoV-2 RNA is generally detectable in upper respiratory specimens during the acute phase of infection. The lowest concentration of SARS-CoV-2 viral copies this assay can detect is 138 copies/mL. A negative result does not preclude SARS-Cov-2 infection and should not be used as the sole basis for  treatment or other patient management decisions. A negative result may occur with  improper specimen collection/handling, submission of specimen other than nasopharyngeal swab, presence of viral mutation(s) within the areas targeted by this assay, and inadequate number of viral copies(<138 copies/mL). A negative result must be combined with clinical observations, patient history, and epidemiological information. The expected result is Negative.  Fact Sheet for Patients:  EntrepreneurPulse.com.au  Fact Sheet for Healthcare Providers:  IncredibleEmployment.be  This test is no t yet approved or cleared by the [place] FDA and  has been authorized for detection and/or diagnosis of SARS-CoV-2 by FDA under an Emergency Use Authorization (EUA). This EUA will remain  in effect (meaning this test can be used) for the duration of the COVID-19 declaration under Section 564(b)(1) of the Act, 21 U.S.C.section 360bbb-3(b)(1), unless the authorization is terminated  or revoked sooner.       Influenza A by PCR POSITIVE (A) NEGATIVE Final   Influenza B by PCR NEGATIVE NEGATIVE Final    Comment: (NOTE) The Xpert Xpress SARS-CoV-2/FLU/RSV plus assay is intended as an aid in the diagnosis of influenza from Nasopharyngeal swab specimens and should not be used as a sole basis for treatment. Nasal washings and aspirates are unacceptable for Xpert Xpress SARS-CoV-2/FLU/RSV testing.  Fact Sheet for Patients: EntrepreneurPulse.com.au  Fact Sheet for Healthcare Providers: IncredibleEmployment.be  This test is not yet approved or cleared by the [place] FDA and has been authorized for detection and/or diagnosis of SARS-CoV-2 by FDA under an Emergency Use Authorization (EUA). This EUA will remain in effect (meaning this test can be used) for the duration of the COVID-19 declaration under Section 564(b)(1) of the Act, 21  U.S.C. section 360bbb-3(b)(1), unless the authorization is terminated or revoked.     Resp Syncytial Virus by PCR NEGATIVE NEGATIVE Final    Comment: (NOTE) Fact Sheet for Patients: EntrepreneurPulse.com.au  Fact Sheet for Healthcare Providers: IncredibleEmployment.be  This test is not yet approved or cleared by the [place] FDA and has been authorized for detection and/or diagnosis of SARS-CoV-2 by FDA under an Emergency Use Authorization (EUA). This EUA will remain in effect (meaning this test can be used) for the duration of the COVID-19 declaration under Section 564(b)(1) of the Act, 21 U.S.C. section 360bbb-3(b)(1), unless the authorization is terminated or revoked.  Performed at [facility], [address] [place], [place] [address]   Blood Culture (routine x 2)     Status: None (Preliminary result)   Collection Time: 03/31/22 12:55 AM   Specimen: BLOOD  Result Value Ref Range Status   Specimen Description BLOOD LEFT ANTECUBITAL  Final   Special Requests   Final    BOTTLES DRAWN AEROBIC AND ANAEROBIC Blood Culture results may not be optimal  due to an excessive volume of blood received in culture bottles   Culture   Final    NO GROWTH 4 DAYS Performed at [facility], [address] [place], [place] [address]    Report Status PENDING  Incomplete  Urine Culture     Status: None   Collection Time: 03/31/22  2:59 AM   Specimen: Urine, Clean Catch  Result Value Ref Range Status   Specimen Description   Final    URINE, CLEAN CATCH Performed at [facility], [address] [place], [place] [address]    Special Requests   Final    NONE Performed at [facility], [address] [place], [place] [address]    Culture   Final    NO GROWTH Performed at [facility], [address] [place], [place] [address]    Report Status 04/01/2022 FINAL  Final  Respiratory (~20 pathogens)  panel by PCR     Status: Abnormal   Collection Time: 03/31/22  2:59 AM   Specimen: Urine, Clean Catch; Respiratory  Result Value Ref Range Status   Adenovirus NOT DETECTED NOT DETECTED Final   Coronavirus 229E NOT DETECTED NOT DETECTED Final    Comment: (NOTE) The Coronavirus on the Respiratory Panel, DOES NOT test for the novel  Coronavirus (2019 nCoV)    Coronavirus HKU1 NOT DETECTED NOT DETECTED Final   Coronavirus NL63 NOT DETECTED NOT DETECTED Final   Coronavirus OC43 NOT DETECTED NOT DETECTED Final   Metapneumovirus NOT DETECTED NOT DETECTED Final   Rhinovirus / Enterovirus NOT DETECTED NOT DETECTED Final   Influenza A H1 2009 DETECTED (A) NOT DETECTED Final   Influenza B NOT DETECTED NOT DETECTED Final   Parainfluenza Virus 1 NOT DETECTED NOT DETECTED Final   Parainfluenza Virus 2 NOT DETECTED NOT DETECTED Final   Parainfluenza Virus 3 NOT DETECTED NOT DETECTED Final   Parainfluenza Virus 4 NOT DETECTED NOT DETECTED Final   Respiratory Syncytial Virus NOT DETECTED NOT DETECTED Final   Bordetella pertussis NOT DETECTED NOT DETECTED Final   Bordetella Parapertussis NOT DETECTED NOT DETECTED Final   Chlamydophila pneumoniae NOT DETECTED NOT DETECTED Final   Mycoplasma pneumoniae NOT DETECTED NOT DETECTED Final    Comment: Performed at [facility], [address] [place], [place] [address]  MRSA Next Gen by PCR, Nasal     Status: None   Collection Time: 03/31/22  4:45 AM   Specimen: Nasal Mucosa; Nasal Swab  Result Value Ref Range Status   MRSA by PCR Next Gen NOT DETECTED NOT DETECTED Final    Comment: (NOTE) The GeneXpert MRSA Assay (FDA approved for NASAL specimens only), is one component of a comprehensive MRSA colonization surveillance program. It is not intended to diagnose MRSA infection nor to guide or monitor treatment for MRSA infections. Test performance is not FDA approved in patients less than 108 years old. Performed at [facility], [address] [place], [place] [address]   Culture, Respiratory w Gram Stain     Status: None   Collection Time: 04/01/22 11:23 AM   Specimen: Tracheal Aspirate; Respiratory  Result Value Ref Range Status   Specimen Description   Final    TRACHEAL ASPIRATE Performed at [facility], [address] [place], [place] [address]    Special Requests   Final    NONE Performed at [facility], [address] [place], [place] [address]    Gram Stain   Final  MODERATE WBC PRESENT, PREDOMINANTLY PMN NO ORGANISMS SEEN    Culture   Final    NO GROWTH 2 DAYS Performed at [facility] [address], [place], [place] [address]    Report Status 04/03/2022 FINAL  Final    Coagulation Studies: No results for input(s): "LABPROT", "INR" in the last 72 hours.  Urinalysis: No results for input(s): "COLORURINE", "LABSPEC", "PHURINE", "GLUCOSEU", "HGBUR", "BILIRUBINUR", "KETONESUR", "PROTEINUR", "UROBILINOGEN", "NITRITE", "LEUKOCYTESUR" in the last 72 hours.  Invalid input(s): "APPERANCEUR"    Imaging: No results found.   Medications:    azithromycin 500 mg (04/04/22 0254)   cefTRIAXone (ROCEPHIN)  IV 2 g (04/04/22 0301)    guaiFENesin  1,200 mg Oral BID   heparin  5,000 Units Subcutaneous Q8H   insulin aspart  0-9 Units Subcutaneous TID WC   levalbuterol  0.63 mg Nebulization TID   menthol-cetylpyridinium  1 lozenge Oral TID   methylPREDNISolone (SOLU-MEDROL) injection  40 mg Intravenous BID   oseltamivir  75 mg Per Tube BID   pantoprazole (PROTONIX) IV  40 mg Intravenous Q24H   docusate, HYDROcodone bit-homatropine, mouth rinse, polyethylene glycol  Assessment/ Plan:  Curtis Erickson is a 31 y.o.  male with medical problems  was admitted on 03/31/2022 for : ARDS (adult respiratory distress syndrome) ([facility]) [J80] Influenza A [J10.1] Acute respiratory failure with hypoxia ([place]) [J96.01] Respiratory failure with hypoxia ([place])  [J96.91]   #Proteinuria #Hyperglycemia #Acute kidney injury with creatinine increased from 0.67-1.22, now back to 0.82.  Likely from IV contrast exposure. #Anemia #Leukopenia #Thrombocytopenia # ARDS, acute respiratory failure, influenza A positive  Plan: Creatinine remained stable.  Complements, ANA, and GBM within defined limits.  Still awaiting ANCA.  Good urine output noted.  Patient stable from renal stance.  Hematuria appears likely from diabetes.  Will continue to follow patient outpatient.  We will sign off at this time.    LOS: [place] 12/27/20232:14 PM

## 2022-04-04 NOTE — Progress Notes (Signed)
Nutrition Follow-up  DOCUMENTATION CODES:   Not applicable  INTERVENTION:   -Ensure Enlive po BID, each supplement provides 350 kcal and 20 grams of protein -MVI with minerals daily  NUTRITION DIAGNOSIS:   Inadequate oral intake related to inability to eat as evidenced by NPO status.  Progressing; advanced to PO diet on 03/10/22  GOAL:   Patient will meet greater than or equal to 90% of their needs  Progressing   MONITOR:   PO intake, Supplement acceptance  REASON FOR ASSESSMENT:   Consult Enteral/tube feeding initiation and management  ASSESSMENT:   31 y.o. male admits related to SOB, cough, fevers and chills. PMH includes: HTN, alcoholic fatty liver, microcytic anemia. Pt is currently receiving medical management for acute hypoxic and hypercapnic respiratory failure.  12/24- extubated, advanced to a regular diet  Reviewed I/O's: -160 ml x 24 hours and +1.7 L since admission  UOP: 750 ml x 24 hours   Pt unavailable at time of visit. Attempted to speak with pt via call to hospital room phone, however, unable to reach. RD unable to obtain further nutrition-related history or complete nutrition-focused physical exam at this time.    Pt currently on a regular diet with fair appetite. Noted meal completions 50%.   No wt loss noted since admission.   Pt with increased nutritional needs and would greatly benefit from addition of oral nutrition supplements.   Medications reviewed and include solu-medrol.   Labs reviewed: CBGS: 144-210 (inpatient orders for glycemic control are 0-9 units insulin aspart TID with meals).    Diet Order:   Diet Order             Diet regular Room service appropriate? Yes; Fluid consistency: Thin  Diet effective now                   EDUCATION NEEDS:   Not appropriate for education at this time  Skin:  Skin Assessment: Reviewed RN Assessment  Last BM:  04/03/22  Height:   Ht Readings from Last 1 Encounters:  03/31/22 5'  8" (1.727 m)    Weight:   Wt Readings from Last 1 Encounters:  04/04/22 92 kg   BMI:  Body mass index is 30.84 kg/m.  Estimated Nutritional Needs:   Kcal:  1900-2100  Protein:  90-105 grams  Fluid:  > 1.9 L    Levada Schilling, RD, LDN, CDCES Registered Dietitian II Certified Diabetes Care and Education Specialist Please refer to [facility] for RD and/or RD on-call/weekend/after hours pager

## 2022-04-04 NOTE — Progress Notes (Signed)
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   Per ED reports, EMS was called due to respiratory distress.  Patient's girlfriend reported that patient been complaining of bilateral lower extremity edema, flu-like symptoms with worsening shortness of breath.  On EMS arrival patient was found with RR40-50's, sats 90% on room air.  He was treated with 125 mg of Solu-Medrol, LR 500 mL and DuoNeb x 2.   ED Course: Initial vital signs showed HR of 150 beats/minute, BP 168/75 mm Hg, the RR 28 breaths/minute, and the oxygen saturation 92 % on 3L and a temperature of 103.27F (39.5C).    Patient's condition deteriorated noted to be in acute hypoxic and hypercapnic respiratory failure secondary to influenza A infection and possible superimposed bacterial infection, subsequently intubated and admitted to the critical care service.  Patient improved clinically, extubated, O2 sats improved and transferred to the hospitalist service.   Assessment and Plan: No notes have been filed under this hospital service. Service: Hospitalist   Acute respiratory failure with hypoxia secondary to influenza A pneumonia, multifocal pneumonia and possible EVALI -Patient noted to have presented and admitted with acute hypoxic and hypercapnic respiratory failure secondary to influenza A infection and possible bacterial infection. -Patient admitted, intubated under the critical care service. -CT angiogram chest done with perihilar predominant bilateral groundglass and consolidative opacities nonspecific but can be  seen in the setting of multifocal pneumonia, ARDS or pulmonary edema. -SpO2: 96 % O2 Flow Rate (L/min): 3 L/min FiO2 (%): 35 % -Chest x-ray done with interval development of perihilar airspace infiltrate in keeping with perihilar alveolar pulmonary edema multifocal infection. -2D echo done with a EF of 60 to 65%,NWMA. -Patient placed on IV Solu-Medrol for treatment of EVALI; was getting IV 40 mg twice daily and will switch to 60 mg po Daily in the AM likely  -ANA/ANCA/anti-GBM ordered per critical care service and Negative . -Continue current steroids taper as recommended by PCCM. -Continue Mucinex, scheduled nebulizers, Tamiflu, PPI, empiric IV azithromycin and IV Rocephin. -Continuous pulse oximetry maintain O2 saturation greater than 90% -Continue supplemental oxygen via nasal cannula and wean O2 as tolerated -C/w Supportive care. -C/w Hycodan and Guaifenesin 1200 mg po BID -Will C/w Xopenex 0.63 mg Neb TID -Repeat chest x-ray in the a.m. -Will get PT/OT to evaluate and treat and will need an ambulatory home O2 screen   Influenza A -Continue Tamiflu, bronchodilators, antitussives, steroids.   -Place on Cepacol lozenges. -Having some mild Hemoptysis    Acute kidney injury Metabolic Acidosis, mild -Patient noted to have presented with mild acute kidney injury per pulmonary. -Patient noted to have had significant proteinuria on presentation and urinalysis, concern for nephrotic syndrome versus vasculitic process versus diabetes induced nephropathy. Recent Labs  Lab 03/31/22 1059 03/31/22 1648 04/01/22 0446 04/01/22 1704 04/02/22 0424 04/03/22 0621 04/04/22 0704  BUN 13 18 31* 36* 42* 41* 42*  CREATININE 0.67 1.08 1.22 0.86 0.82 0.73 0.81  -Patient has a mild Metabolic Acidosis with a CO2 of 21, AG, of 12, and Chloride Level of 105 -Urine studies ordered as well as ANA/ANCA/anti-GBM. -Nephrology consulted with the following. -  Continue empiric steroids. -Further care Per  nephrology.   Acute metabolic encephalopathy, improved  -Likely secondary to problem #1 and #2. -Improved with treatment of problem #1. -Resolved.   History of liver disease/concern for underlying cirrhosis -Patient with thrombocytopenia, liver contour smooth on imaging. -Patient with no overt bleeding. -Continue to Monitor and Trend and repeat LFTs in the AM    Pancytopenia -Likely secondary to underlying cirrhosis. -Has a Macrocytosis  -Patient with no overt bleeding. Recent Labs  Lab 03/31/22 0034 03/31/22 4268 04/01/22 0446 04/02/22 0424 04/03/22 0621 04/04/22 0704  WBC 2.3* 1.6* 4.3 2.7* 2.2* 2.0*  HGB 12.2* 11.0* 11.1* 11.7* 11.3* 12.7*  HCT 36.9* 32.6* 34.4* 34.7* 34.1* 37.4*  PLT 138* 88* 107* 118* 117* 129*  -Continue IV antibiotics. -Repeat labs in the AM.   Obesity -Complicates overall prognosis and care -Estimated body mass index is 30.84 kg/m as calculated from the following:   Height as of this encounter: 5\' 8"  (1.727 m).   Weight as of this encounter: 92 kg.  -Weight Loss and Dietary Counseling given  DVT prophylaxis: heparin injection 5,000 Units Start: 03/31/22 0600    Code Status: Full Code Family Communication: No family present at bedside   Disposition Plan:  Level of care: Telemetry Medical Status is: Inpatient Remains inpatient appropriate because: Needs further clinical improvement in his respiratory status   Consultants:  PCCM transfer Nephrology  Procedures:  As delineated as above  Antimicrobials:  Anti-infectives (From admission, onward)    Start     Dose/Rate Route Frequency Ordered Stop   04/04/22 0200  cefTRIAXone (ROCEPHIN) 2 g in sodium chloride 0.9 % 100 mL IVPB        2 g 200 mL/hr over 30 Minutes Intravenous Every 24 hours 04/03/22 0928     04/01/22 0200  cefTRIAXone (ROCEPHIN) 1 g in sodium chloride 0.9 % 100 mL IVPB  Status:  Discontinued        1 g 200 mL/hr over 30 Minutes Intravenous Every 24 hours 03/31/22 0610  04/03/22 0928   04/01/22 0200  azithromycin (ZITHROMAX) 500 mg in sodium chloride 0.9 % 250 mL IVPB        500 mg 250 mL/hr over 60 Minutes Intravenous Every 24 hours 03/31/22 0610     03/31/22 2200  oseltamivir (TAMIFLU) capsule 75 mg        75 mg Per Tube 2 times daily 03/31/22 1114 04/05/22 2159   03/31/22 0245  oseltamivir (TAMIFLU) capsule 75 mg  Status:  Discontinued        75 mg Oral 2 times daily 03/31/22 0230 03/31/22 1114   03/31/22 0215  oseltamivir (TAMIFLU) capsule 75 mg  Status:  Discontinued        75 mg Oral Daily 03/31/22 0206 03/31/22 0230   03/31/22 0145  cefTRIAXone (ROCEPHIN) 2 g in sodium chloride 0.9 % 100 mL IVPB        2 g 200 mL/hr over 30 Minutes Intravenous  Once 03/31/22 0131 03/31/22 0217   03/31/22 0145  azithromycin (ZITHROMAX) 500 mg in sodium chloride 0.9 % 250 mL IVPB        500 mg 250 mL/hr over 60 Minutes Intravenous  Once 03/31/22 0131 03/31/22 0246       Subjective: Seen and been at bedside and the patient was doing okay and still felt short of breath and states that he is coughing up some bloody sputum now.  Denies any nausea or vomiting.  Feels extremely weak.  No  other concerns or complaints at this time.  Objective: Vitals:   04/03/22 2225 04/04/22 0125 04/04/22 0258 04/04/22 0641  BP:  (!) 160/108  (!) 160/95  Pulse:  (!) 110  (!) 101  Resp:  18    Temp:  97.6 F (36.4 C)  98.5 F (36.9 C)  TempSrc:    Oral  SpO2: 94% 96%  97%  Weight:   92 kg   Height:        Intake/Output Summary (Last 24 hours) at 04/04/2022 0806 Last data filed at 04/03/2022 2245 Gross per 24 hour  Intake 590 ml  Output 750 ml  Net -160 ml   Filed Weights   04/02/22 0157 04/03/22 0342 04/04/22 0258  Weight: 87.4 kg 87.4 kg 92 kg   Examination: Physical Exam:  Constitutional: WN/WD obese Caucasian male currently no acute distress appears fatigued and slightly short of breath Respiratory: Diminished to auscultation bilaterally with coarse breath sounds  and some rhonchi and some slight wheezing.  No appreciable crackles or rales.  Normal respiratory effort and patient is not tachypenic. No accessory muscle use.  Wearing supplemental oxygen nasal cannula Cardiovascular: RRR, no murmurs / rubs / gallops. S1 and S2 auscultated.  Trace extremity edema Abdomen: Soft, non-tender, distended secondary to body habitus. Bowel sounds positive.  GU: Deferred. Musculoskeletal: No clubbing / cyanosis of digits/nails. No joint deformity upper and lower extremities.  Skin: No rashes, lesions, ulcers on limited skin evaluation. No induration; Warm and dry.  Neurologic: CN 2-12 grossly intact with no focal deficits. Romberg sign and cerebellar reflexes not assessed.  Psychiatric: Normal judgment and insight. Alert and oriented x 3. Normal mood and appropriate affect.   Data Reviewed: I have personally reviewed following labs and imaging studies  CBC: Recent Labs  Lab 03/31/22 0034 03/31/22 5732 04/01/22 0446 04/02/22 0424 04/03/22 0621 04/04/22 0704  WBC 2.3* 1.6* 4.3 2.7* 2.2* 2.0*  NEUTROABS 1.0*  --  3.8 2.1  --  1.4*  HGB 12.2* 11.0* 11.1* 11.7* 11.3* 12.7*  HCT 36.9* 32.6* 34.4* 34.7* 34.1* 37.4*  MCV 109.5* 108.3* 112.8* 109.1* 106.9* 104.8*  PLT 138* 88* 107* 118* 117* 129*   Basic Metabolic Panel: Recent Labs  Lab 03/31/22 1648 04/01/22 0446 04/01/22 1704 04/02/22 0424 04/03/22 0621 04/04/22 0704  NA 135 135 137 140 139 138  K 4.3 4.5 3.7 4.9 5.1 5.1  CL 102 102 104 104 105 105  CO2 16* 22 20* 20* 22 21*  GLUCOSE 165* 217* 142* 176* 156* 177*  BUN 18 31* 36* 42* 41* 42*  CREATININE 1.08 1.22 0.86 0.82 0.73 0.81  CALCIUM 8.2* 8.7* 9.2 9.9 9.6 9.6  MG 2.3 2.4 2.5* 2.5* 2.2 2.2  PHOS 6.1* 5.4* 2.6 2.6 2.9  --    GFR: Estimated Creatinine Clearance: 146.7 mL/min (by C-G formula based on SCr of 0.81 mg/dL). Liver Function Tests: Recent Labs  Lab 03/31/22 0034 03/31/22 0941 04/01/22 0440  AST 40 35 35  ALT 19 19 18   ALKPHOS  76 62 50  BILITOT 2.5* 2.2* 1.5*  PROT 7.3 6.8 6.7  ALBUMIN 3.9 3.6 3.4*   No results for input(s): "LIPASE", "AMYLASE" in the last 168 hours. No results for input(s): "AMMONIA" in the last 168 hours. Coagulation Profile: Recent Labs  Lab 03/31/22 0034  INR 1.6*   Cardiac Enzymes: No results for input(s): "CKTOTAL", "CKMB", "CKMBINDEX", "TROPONINI" in the last 168 hours. BNP (last 3 results) No results for input(s): "PROBNP" in the last 8760 hours.  HbA1C: Recent Labs    04/02/22 0424 04/03/22 0621  HGBA1C 5.1 5.2   CBG: Recent Labs  Lab 04/02/22 2309 04/03/22 0331 04/03/22 0722 04/03/22 1612 04/03/22 2130  GLUCAP 149* 153* 172* 197* 210*   Lipid Profile: No results for input(s): "CHOL", "HDL", "LDLCALC", "TRIG", "CHOLHDL", "LDLDIRECT" in the last 72 hours. Thyroid Function Tests: No results for input(s): "TSH", "T4TOTAL", "FREET4", "T3FREE", "THYROIDAB" in the last 72 hours. Anemia Panel: No results for input(s): "VITAMINB12", "FOLATE", "FERRITIN", "TIBC", "IRON", "RETICCTPCT" in the last 72 hours. Sepsis Labs: Recent Labs  Lab 03/31/22 0034 03/31/22 0240  PROCALCITON 0.15  --   LATICACIDVEN 7.1* 4.0*    Recent Results (from the past 240 hour(s))  Blood Culture (routine x 2)     Status: None (Preliminary result)   Collection Time: 03/31/22 12:34 AM   Specimen: BLOOD  Result Value Ref Range Status   Specimen Description BLOOD RIGHT ANTECUBITAL  Final   Special Requests   Final    BOTTLES DRAWN AEROBIC AND ANAEROBIC Blood Culture results may not be optimal due to an excessive volume of blood received in culture bottles   Culture   Final    NO GROWTH 3 DAYS Performed at [facility], [address] [place], [place] [address]    Report Status PENDING  Incomplete  Resp panel by RT-PCR (RSV, Flu A&B, Covid) Anterior Nasal Swab     Status: Abnormal   Collection Time: 03/31/22 12:34 AM   Specimen: Anterior Nasal Swab  Result Value Ref Range Status    SARS Coronavirus 2 by RT PCR NEGATIVE NEGATIVE Final    Comment: (NOTE) SARS-CoV-2 target nucleic acids are NOT DETECTED.  The SARS-CoV-2 RNA is generally detectable in upper respiratory specimens during the acute phase of infection. The lowest concentration of SARS-CoV-2 viral copies this assay can detect is 138 copies/mL. A negative result does not preclude SARS-Cov-2 infection and should not be used as the sole basis for treatment or other patient management decisions. A negative result may occur with  improper specimen collection/handling, submission of specimen other than nasopharyngeal swab, presence of viral mutation(s) within the areas targeted by this assay, and inadequate number of viral copies(<138 copies/mL). A negative result must be combined with clinical observations, patient history, and epidemiological information. The expected result is Negative.  Fact Sheet for Patients:  EntrepreneurPulse.com.au  Fact Sheet for Healthcare Providers:  IncredibleEmployment.be  This test is no t yet approved or cleared by the [place] FDA and  has been authorized for detection and/or diagnosis of SARS-CoV-2 by FDA under an Emergency Use Authorization (EUA). This EUA will remain  in effect (meaning this test can be used) for the duration of the COVID-19 declaration under Section 564(b)(1) of the Act, 21 U.S.C.section 360bbb-3(b)(1), unless the authorization is terminated  or revoked sooner.       Influenza A by PCR POSITIVE (A) NEGATIVE Final   Influenza B by PCR NEGATIVE NEGATIVE Final    Comment: (NOTE) The Xpert Xpress SARS-CoV-2/FLU/RSV plus assay is intended as an aid in the diagnosis of influenza from Nasopharyngeal swab specimens and should not be used as a sole basis for treatment. Nasal washings and aspirates are unacceptable for Xpert Xpress SARS-CoV-2/FLU/RSV testing.  Fact Sheet for  Patients: EntrepreneurPulse.com.au  Fact Sheet for Healthcare Providers: IncredibleEmployment.be  This test is not yet approved or cleared by the [place] FDA and has been authorized for detection and/or diagnosis of SARS-CoV-2 by FDA under an Emergency Use Authorization (EUA).  This EUA will remain in effect (meaning this test can be used) for the duration of the COVID-19 declaration under Section 564(b)(1) of the Act, 21 U.S.C. section 360bbb-3(b)(1), unless the authorization is terminated or revoked.     Resp Syncytial Virus by PCR NEGATIVE NEGATIVE Final    Comment: (NOTE) Fact Sheet for Patients: EntrepreneurPulse.com.au  Fact Sheet for Healthcare Providers: IncredibleEmployment.be  This test is not yet approved or cleared by the [place] FDA and has been authorized for detection and/or diagnosis of SARS-CoV-2 by FDA under an Emergency Use Authorization (EUA). This EUA will remain in effect (meaning this test can be used) for the duration of the COVID-19 declaration under Section 564(b)(1) of the Act, 21 U.S.C. section 360bbb-3(b)(1), unless the authorization is terminated or revoked.  Performed at [facility], [address] [place], [place] [address]   Blood Culture (routine x 2)     Status: None (Preliminary result)   Collection Time: 03/31/22 12:55 AM   Specimen: BLOOD  Result Value Ref Range Status   Specimen Description BLOOD LEFT ANTECUBITAL  Final   Special Requests   Final    BOTTLES DRAWN AEROBIC AND ANAEROBIC Blood Culture results may not be optimal due to an excessive volume of blood received in culture bottles   Culture   Final    NO GROWTH 3 DAYS Performed at [facility], [address], [place], [place] [address]    Report Status PENDING  Incomplete  Urine Culture     Status: None   Collection Time: 03/31/22  2:59 AM   Specimen: Urine, Clean Catch   Result Value Ref Range Status   Specimen Description   Final    URINE, CLEAN CATCH Performed at [facility], [address] [place], [place] [address]    Special Requests   Final    NONE Performed at [facility], [address], [place], [place] [address]    Culture   Final    NO GROWTH Performed at [facility], [address], [place], [place] [address]    Report Status 04/01/2022 FINAL  Final  Respiratory (~20 pathogens) panel by PCR     Status: Abnormal   Collection Time: 03/31/22  2:59 AM   Specimen: Urine, Clean Catch; Respiratory  Result Value Ref Range Status   Adenovirus NOT DETECTED NOT DETECTED Final   Coronavirus 229E NOT DETECTED NOT DETECTED Final    Comment: (NOTE) The Coronavirus on the Respiratory Panel, DOES NOT test for the novel  Coronavirus (2019 nCoV)    Coronavirus HKU1 NOT DETECTED NOT DETECTED Final   Coronavirus NL63 NOT DETECTED NOT DETECTED Final   Coronavirus OC43 NOT DETECTED NOT DETECTED Final   Metapneumovirus NOT DETECTED NOT DETECTED Final   Rhinovirus / Enterovirus NOT DETECTED NOT DETECTED Final   Influenza A H1 2009 DETECTED (A) NOT DETECTED Final   Influenza B NOT DETECTED NOT DETECTED Final   Parainfluenza Virus 1 NOT DETECTED NOT DETECTED Final   Parainfluenza Virus 2 NOT DETECTED NOT DETECTED Final   Parainfluenza Virus 3 NOT DETECTED NOT DETECTED Final   Parainfluenza Virus 4 NOT DETECTED NOT DETECTED Final   Respiratory Syncytial Virus NOT DETECTED NOT DETECTED Final   Bordetella pertussis NOT DETECTED NOT DETECTED Final   Bordetella Parapertussis NOT DETECTED NOT DETECTED Final   Chlamydophila pneumoniae NOT DETECTED NOT DETECTED Final   Mycoplasma pneumoniae NOT DETECTED NOT DETECTED Final    Comment: Performed at [facility],  [address], [place], [place] [address]  MRSA Next Gen by PCR, Nasal     Status: None   Collection Time: 03/31/22  4:45 AM   Specimen: Nasal Mucosa; Nasal Swab   Result Value Ref Range Status   MRSA by PCR Next Gen NOT DETECTED NOT DETECTED Final    Comment: (NOTE) The GeneXpert MRSA Assay (FDA approved for NASAL specimens only), is one component of a comprehensive MRSA colonization surveillance program. It is not intended to diagnose MRSA infection nor to guide or monitor treatment for MRSA infections. Test performance is not FDA approved in patients less than 57 years old. Performed at [facility], [address] [place], [place] [address]   Culture, Respiratory w Gram Stain     Status: None   Collection Time: 04/01/22 11:23 AM   Specimen: Tracheal Aspirate; Respiratory  Result Value Ref Range Status   Specimen Description   Final    TRACHEAL ASPIRATE Performed at [facility], [address] [place], [place] [address]    Special Requests   Final    NONE Performed at [facility], [address] [place], [place] [address]    Gram Stain   Final    MODERATE WBC PRESENT, PREDOMINANTLY PMN NO ORGANISMS SEEN    Culture   Final    NO GROWTH 2 DAYS Performed at [facility], [address] [place], [place] [address]    Report Status 04/03/2022 FINAL  Final     Radiology Studies: No results found.  Scheduled Meds:  Chlorhexidine Gluconate Cloth  6 each Topical Daily   dextromethorphan-guaiFENesin  1 tablet Oral BID   heparin  5,000 Units Subcutaneous Q8H   insulin aspart  0-9 Units Subcutaneous TID WC   levalbuterol  0.63 mg Nebulization TID   menthol-cetylpyridinium  1 lozenge Oral TID   methylPREDNISolone (SOLU-MEDROL) injection  40 mg Intravenous BID   oseltamivir  75 mg Per Tube BID   pantoprazole (PROTONIX) IV  40 mg Intravenous Q24H   Continuous Infusions:  azithromycin 500 mg (04/04/22 0254)   cefTRIAXone (ROCEPHIN)  IV 2 g (04/04/22 0301)    LOS: 4 days   Marguerita Merles, DO [facility] Available via Epic secure chat 7am-7pm After these hours, please refer to coverage  provider listed on [email or web address] 04/04/2022, 8:06 AM

## 2022-04-05 ENCOUNTER — Inpatient Hospital Stay: Payer: Self-pay

## 2022-04-05 DIAGNOSIS — E872 Acidosis, unspecified: Secondary | ICD-10-CM

## 2022-04-05 LAB — PHOSPHORUS: Phosphorus: 4.2 mg/dL (ref 2.5–4.6)

## 2022-04-05 LAB — CBC WITH DIFFERENTIAL/PLATELET
Abs Immature Granulocytes: 0.02 10*3/uL (ref 0.00–0.07)
Basophils Absolute: 0 10*3/uL (ref 0.0–0.1)
Basophils Relative: 0 %
Eosinophils Absolute: 0 10*3/uL (ref 0.0–0.5)
Eosinophils Relative: 0 %
HCT: 35.1 % — ABNORMAL LOW (ref 39.0–52.0)
Hemoglobin: 11.9 g/dL — ABNORMAL LOW (ref 13.0–17.0)
Immature Granulocytes: 2 %
Lymphocytes Relative: 36 %
Lymphs Abs: 0.5 10*3/uL — ABNORMAL LOW (ref 0.7–4.0)
MCH: 35.3 pg — ABNORMAL HIGH (ref 26.0–34.0)
MCHC: 33.9 g/dL (ref 30.0–36.0)
MCV: 104.2 fL — ABNORMAL HIGH (ref 80.0–100.0)
Monocytes Absolute: 0.1 10*3/uL (ref 0.1–1.0)
Monocytes Relative: 7 %
Neutro Abs: 0.8 10*3/uL — ABNORMAL LOW (ref 1.7–7.7)
Neutrophils Relative %: 55 %
Platelets: 110 10*3/uL — ABNORMAL LOW (ref 150–400)
RBC: 3.37 MIL/uL — ABNORMAL LOW (ref 4.22–5.81)
RDW: 14.4 % (ref 11.5–15.5)
Smear Review: NORMAL
WBC: 1.4 10*3/uL — CL (ref 4.0–10.5)
nRBC: 2.9 % — ABNORMAL HIGH (ref 0.0–0.2)

## 2022-04-05 LAB — COMPREHENSIVE METABOLIC PANEL
ALT: 64 U/L — ABNORMAL HIGH (ref 0–44)
AST: 50 U/L — ABNORMAL HIGH (ref 15–41)
Albumin: 3.3 g/dL — ABNORMAL LOW (ref 3.5–5.0)
Alkaline Phosphatase: 81 U/L (ref 38–126)
Anion gap: 11 (ref 5–15)
BUN: 37 mg/dL — ABNORMAL HIGH (ref 6–20)
CO2: 22 mmol/L (ref 22–32)
Calcium: 9.2 mg/dL (ref 8.9–10.3)
Chloride: 104 mmol/L (ref 98–111)
Creatinine, Ser: 0.67 mg/dL (ref 0.61–1.24)
GFR, Estimated: >60 mL/min (ref >60)
Glucose, Bld: 160 mg/dL — ABNORMAL HIGH (ref 70–99)
Potassium: 4.9 mmol/L (ref 3.5–5.1)
Sodium: 137 mmol/L (ref 135–145)
Total Bilirubin: 1.2 mg/dL (ref 0.3–1.2)
Total Protein: 6.3 g/dL — ABNORMAL LOW (ref 6.5–8.1)

## 2022-04-05 LAB — MAGNESIUM: Magnesium: 2.1 mg/dL (ref 1.7–2.4)

## 2022-04-05 LAB — LACTIC ACID, PLASMA
Lactic Acid, Venous: 3.4 mmol/L (ref 0.5–1.9)
Lactic Acid, Venous: 4.6 mmol/L (ref 0.5–1.9)
Lactic Acid, Venous: 5.1 mmol/L (ref 0.5–1.9)
Lactic Acid, Venous: 4.8 mmol/L (ref 0.5–1.9)
Lactic Acid, Venous: 4.5 mmol/L (ref 0.5–1.9)

## 2022-04-05 LAB — GLUCOSE, CAPILLARY
Glucose-Capillary: 153 mg/dL — ABNORMAL HIGH (ref 70–99)
Glucose-Capillary: 138 mg/dL — ABNORMAL HIGH (ref 70–99)
Glucose-Capillary: 140 mg/dL — ABNORMAL HIGH (ref 70–99)
Glucose-Capillary: 196 mg/dL — ABNORMAL HIGH (ref 70–99)

## 2022-04-05 LAB — CULTURE, BLOOD (ROUTINE X 2)
Culture: NO GROWTH
Culture: NO GROWTH

## 2022-04-05 MED ORDER — SODIUM CHLORIDE 0.9 % IV BOLUS
1000.0000 mL | Freq: Once | INTRAVENOUS | Status: AC
  Administered 2022-04-05: 1000 mL via INTRAVENOUS

## 2022-04-05 MED ORDER — SODIUM CHLORIDE 0.9 % IV BOLUS
500.0000 mL | Freq: Once | INTRAVENOUS | Status: AC
  Administered 2022-04-05: 500 mL via INTRAVENOUS

## 2022-04-05 NOTE — Evaluation (Signed)
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 EMS was called due to respiratory distress. Patient's girlfriend reported that patient been complaining of bilateral lower extremity edema, flu-like symptoms with worsening shortness of breath.  On EMS arrival patient was found with RR40-50's, sats 90% on room air. He was treated with 125 mg of Solu-Medrol, LR 500 mL and DuoNeb x 2 and transported to [facility]. Initial vital signs showed HR of 150 beats/minute, BP 168/75 mm Hg, the RR 28 breaths/minute, and the oxygen saturation 92 % on 3L and a temperature of 103.25F (39.5C). Patient's condition deteriorated; he was noted to be in acute hypoxic and hypercapnic respiratory failure secondary to influenza A infection and possible superimposed bacterial infection, subsequently intubated and admitted to the critical care service. Patient improved clinically, extubated, O2 sats improved and transferred to the hospitalist service.   Clinical Impression   Pt presents with SOB, impaired balance, limited endurance. Nursing staff had recently begun a trial of removing supplemental O2, and pt's O2 sats were in the mid 80s, with HR 102-106. RN and MD notified, O2  returned at 3L. Pt has limited OOB endurance; slow, staggered gait, requires increased time and effort for mobility, and coughs heavily throughout session. Pt uses furniture/walls to stabilize self during ambulation. He reports he has a rollator at home (from a previous hospitalization) but does not use it; he does state that  he frequently holds on to furniture when walking at home. Discussed possibility of using SPC, with pt stating he would consider. Pt reports he had been vaping frequently prior to this hospitalization. Provide educ re: discontinuing any forms of vaping/smoking, given severity of current respiratory distress. Pt lives with girlfriend in [place] home, [address], and states that he completes B/IADL INDly. 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 Given pt's limited endurance and impaired balance at present, therapist suggested outpatient OT post DC, but pt declined, saying he does not have health insurance and cannot afford therapy. He states that his girlfriend is an Charity fundraiser and can assist him at home PRN.   Recommendations for follow up therapy are one component of a multi-disciplinary discharge planning process, led by the attending physician.  Recommendations may be updated based on patient status, additional functional criteria and insurance authorization.   Follow Up Recommendations  No OT follow up     Assistance Recommended at Discharge PRN  Patient can return home with the following      Functional Status Assessment  Patient has had a recent decline in their functional status and demonstrates the ability to make significant improvements in function in a reasonable and predictable amount of time.  Equipment Recommendations  Other (comment) [facility])    Recommendations for Other Services       Precautions / Restrictions Precautions Precaution Comments: droplet precautions Restrictions Weight Bearing Restrictions: No      Mobility Bed Mobility Overal bed mobility: Needs Assistance Bed Mobility: Supine to Sit     Supine to sit: Modified independent (Device/Increase time)          Transfers Overall transfer level: Needs assistance Equipment used: None Transfers: Sit to/from Stand,  Bed to chair/wheelchair/BSC Sit to Stand: Supervision     Step pivot transfers: Min assist      General transfer comment: stumbling, holding on to furniture during transfers      Balance Overall balance assessment: Needs assistance Sitting-balance support: Feet supported, Bilateral upper extremity supported Sitting balance-Leahy Scale: Good     Standing balance support: Single extremity supported Standing balance-Leahy Scale: Fair                             ADL either performed or assessed with clinical judgement   ADL Overall ADL's : Needs assistance/impaired     Grooming: Wash/dry hands;Wash/dry face;Oral care;Minimal assistance Grooming Details (indicate cue type and reason): requires assistance for balancing while standing at sink                             Functional mobility during ADLs: Min guard General ADL Comments: unsteady in OOB fxl mobility, uses furniture/walls while ambulating     Vision         Perception     Praxis      Pertinent Vitals/Pain Pain Assessment Pain Assessment: Faces Faces Pain Scale: Hurts a little bit Pain Location: b/l hands, w/ edema Pain Descriptors / Indicators: Tightness, Discomfort Pain Intervention(s): Repositioned     Hand Dominance Right   Extremity/Trunk Assessment Upper Extremity Assessment Upper Extremity Assessment: Overall WFL for tasks assessed   Lower Extremity Assessment Lower Extremity Assessment: Overall WFL for tasks assessed       Communication Communication Communication: No difficulties   Cognition Arousal/Alertness: Awake/alert Behavior During Therapy: WFL for tasks assessed/performed Overall Cognitive Status: Within Functional Limits for tasks assessed                                       General Comments  b/l UE edema; O2 sats at mid-low 80s throughout session, HR 102-106.    Exercises Other Exercises Other Exercises: Educ re: falls prevention, DME use, DC recs   Shoulder Instructions      Home Living Family/patient expects to be  discharged to:: Private residence Living Arrangements: Spouse/significant other Available Help at Discharge: Family;Available PRN/intermittently Type of Home: House Home Access: Stairs to enter [employer] of Steps: 4   Home Layout: One level     Bathroom Shower/Tub: Chief Strategy Officer: Standard     Home Equipment: Rollator (4 wheels)          Prior Functioning/Environment Prior Level of Function : Working/employed;Independent/Modified Independent             Mobility Comments: no falls, no AD ADLs Comments: IND        OT Problem List: Decreased strength;Decreased activity tolerance;Impaired balance (sitting and/or standing);Decreased safety awareness;Decreased knowledge of use of DME or AE      OT Treatment/Interventions: Self-care/ADL training;Therapeutic exercise;Patient/family education;Balance training;Energy conservation;Therapeutic activities;DME and/or AE instruction    OT Goals(Current goals can be found in the care plan section) Acute Rehab OT Goals Patient Stated Goal: to get out of hospital OT Goal Formulation: With patient Time For Goal Achievement: 04/19/22 Potential to Achieve Goals: Good ADL Goals Pt Will Perform Grooming: Independently;standing Pt Will Perform Lower Body Dressing: with modified independence;sitting/lateral leans Pt Will Transfer to Toilet: with modified independence;regular height toilet;ambulating (using LRAD)  OT Frequency:  Min 2X/week    Co-evaluation              AM-PAC OT "6 Clicks" Daily Activity     Outcome Measure Help from another person eating meals?: None Help from another person taking care of personal grooming?: A Little Help from another person toileting, which includes using toliet, bedpan, or urinal?: A Little Help from another person bathing (including washing, rinsing, drying)?: A Little Help from another person to put on and taking off regular upper body clothing?: None Help  from another person to put on and taking off regular lower body clothing?: None 6 Click Score: 21   End of Session    Activity Tolerance: Patient tolerated treatment well Patient left: in chair;with call bell/phone within reach;with nursing/sitter in room  OT Visit Diagnosis: Unsteadiness on feet (R26.81);Muscle weakness (generalized) (M62.81)                Time: 6294-7654 OT Time Calculation (min): 24 min Charges:  OT General Charges $OT Visit: 1 Visit OT Evaluation $OT Eval Low Complexity: 1 Low OT Treatments $Self Care/Home Management : 23-37 mins Latina Craver, PhD, MS, OTR/L 04/05/22, 3:51 PM

## 2022-04-05 NOTE — Progress Notes (Signed)
Patient voiced concerns about blood pressure being elevated. He would like to discuss possible causes and interventions with his medical team. Patient asymptomatic and without cardiovascular distress. Will continue to monitor.

## 2022-04-05 NOTE — Progress Notes (Signed)
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   Per ED reports, EMS was called due to respiratory distress.  Patient's girlfriend reported that patient been complaining of bilateral lower extremity edema, flu-like symptoms with worsening shortness of breath.  On EMS arrival patient was found with RR40-50's, sats 90% on room air.  He was treated with 125 mg of Solu-Medrol, LR 500 mL and DuoNeb x 2.   ED Course: Initial vital signs showed HR of 150 beats/minute, BP 168/75 mm Hg, the RR 28 breaths/minute, and the oxygen saturation 92 % on 3L and a temperature of 103.29F (39.5C).    Patient's condition deteriorated noted to be in acute hypoxic and hypercapnic respiratory failure secondary to influenza A infection and possible superimposed bacterial infection, subsequently intubated and admitted to the critical care service.  Patient improved clinically, extubated, O2 sats improved and transferred to the hospitalist service.  **The patient's lactic acid started trending back up again so able given IV fluid hydration.  He continues to desaturate as well on room air at rest and use at least 3 L of supplemental oxygen will need continued treatment and will give another dose of IV Solu-Medrol today.  May need to go home on oxygen short-term.  PT OT recommending outpatient PT  Assessment and Plan:  Acute respiratory failure with hypoxia secondary to influenza A pneumonia, multifocal pneumonia and possible EVALI -Patient noted to have presented and admitted with acute hypoxic and hypercapnic  respiratory failure secondary to influenza A infection and possible bacterial infection. -Patient admitted, intubated under the critical care service. -CT angiogram chest done with perihilar predominant bilateral groundglass and consolidative opacities nonspecific but can be seen in the setting of multifocal pneumonia, ARDS or pulmonary edema. -SpO2: 98 % O2 Flow Rate (L/min): 3 L/min FiO2 (%): 35 % -Chest x-ray done with interval development of perihilar airspace infiltrate in keeping with perihilar alveolar pulmonary edema multifocal infection. -2D echo done with a EF of 60 to 65%,NWMA. -Patient placed on IV Solu-Medrol for treatment of EVALI; was getting IV 40 mg twice daily and will switch to 60 mg po Daily in the AM likely  -ANA/ANCA/anti-GBM ordered per critical care service and Negative . -Continue current steroids taper as recommended by PCCM. -Continue Mucinex, scheduled nebulizers, Tamiflu, PPI, empiric IV azithromycin and IV Rocephin. -Continuous pulse oximetry maintain O2 saturation greater than 90% -Continue supplemental oxygen via nasal cannula and wean O2 as tolerated -C/w Supportive care. -C/w Hycodan and Guaifenesin 1200 mg po BID -Will C/w Xopenex 0.63 mg Neb TID -Repeat chest x-ray in the a.m. as today's CXR showed "Interval extubation and removal of gastric tube. Slight improved lung volumes. Persistent right greater than left interstitial and airspace opacities with continued slight improvement from previous studies." -Will get PT/OT to evaluate and treat and will need an ambulatory home O2 screen; OT recommending outpatient physical therapy and he did desaturate today   Influenza A -Continue Tamiflu, bronchodilators, antitussives, steroids.   -Place on Cepacol lozenges. -Having some mild Hemoptysis    Acute kidney injury Metabolic Acidosis, mild -Patient noted to have presented with mild acute kidney  injury per pulmonary. -Patient noted to have had significant  proteinuria on presentation and urinalysis, concern for nephrotic syndrome versus vasculitic process versus diabetes induced nephropathy. Recent Labs  Lab 03/31/22 1648 04/01/22 0446 04/01/22 1704 04/02/22 0424 04/03/22 0621 04/04/22 0704 04/05/22 0317  BUN 18 31* 36* 42* 41* 42* 37*  CREATININE 1.08 1.22 0.86 0.82 0.73 0.81 0.67  -Patient had a mild Metabolic Acidosis with a CO2 of 21, AG, of 12, and Chloride Level of 105, this is now improved and he has a CO2 of 22, anion gap of 11, chloride level 140 -Urine studies ordered as well as ANA/ANCA/anti-GBM and the autoimmune studies were negative. -Nephrology consulted with the following. -Continue empiric steroids. -Further care Per nephrology they have signed off the case  Lactic Acidosis -Unclear etiology and is trending up so we will given IV fluid boluses and given 1.5 L boluses today.   -Current Lactic Acidosis Trend 3.4 -> 4.6 -> 5.1 -Continue to Monitor and Trend -Repeat CMP in the AM    Acute metabolic encephalopathy, improved  -Likely secondary to problem #1 and #2. -Improved with treatment of problem #1. -Resolved.   History of liver disease/concern for underlying cirrhosis -Patient with thrombocytopenia, liver contour smooth on imaging. -Patient with no overt bleeding. -Continue to Monitor and Trend and repeat LFTs in the AM    Pancytopenia -Likely secondary to underlying cirrhosis. -Has a Macrocytosis  -Patient with no overt bleeding. Recent Labs  Lab 03/31/22 0034 03/31/22 4193 04/01/22 0446 04/02/22 0424 04/03/22 0621 04/04/22 0704 04/05/22 0317  WBC 2.3* 1.6* 4.3 2.7* 2.2* 2.0* 1.4*  HGB 12.2* 11.0* 11.1* 11.7* 11.3* 12.7* 11.9*  HCT 36.9* 32.6* 34.4* 34.7* 34.1* 37.4* 35.1*  PLT 138* 88* 107* 118* 117* 129* 110*  -Continue IV antibiotics. -Repeat labs in the AM.  Obesity -Complicates overall prognosis and care -Estimated body mass index is 27.89 kg/m as calculated from the following:   Height  as of this encounter: 5\' 8"  (1.727 m).   Weight as of this encounter: 83.2 kg.  -Weight Loss and Dietary Counseling given   DVT prophylaxis: heparin injection 5,000 Units Start: 03/31/22 0600    Code Status: Full Code Family Communication: No family present at bedside   Disposition Plan:  Level of care: Telemetry Medical Status is: Inpatient Remains inpatient appropriate because: Has a persistent Lactic Acidosis and is requiring supplemental oxygen via nasal cannula   Consultants:  PCCM transfer Nephrology  Procedures:  As delineated as above  Antimicrobials:  Anti-infectives (From admission, onward)    Start     Dose/Rate Route Frequency Ordered Stop   04/04/22 0200  cefTRIAXone (ROCEPHIN) 2 g in sodium chloride 0.9 % 100 mL IVPB        2 g 200 mL/hr over 30 Minutes Intravenous Every 24 hours 04/03/22 0928     04/01/22 0200  cefTRIAXone (ROCEPHIN) 1 g in sodium chloride 0.9 % 100 mL IVPB  Status:  Discontinued        1 g 200 mL/hr over 30 Minutes Intravenous Every 24 hours 03/31/22 0610 04/03/22 0928   04/01/22 0200  azithromycin (ZITHROMAX) 500 mg in sodium chloride 0.9 % 250 mL IVPB        500 mg 250 mL/hr over 60 Minutes Intravenous Every 24 hours 03/31/22 0610     03/31/22 2200  oseltamivir (TAMIFLU) capsule 75 mg        75 mg Per Tube 2 times daily 03/31/22 1114 04/05/22 0948   03/31/22 0245  oseltamivir (TAMIFLU) capsule 75 mg  Status:  Discontinued        75 mg Oral 2 times daily 03/31/22 0230 03/31/22 1114   03/31/22 0215  oseltamivir (TAMIFLU) capsule 75 mg  Status:  Discontinued        75 mg Oral Daily 03/31/22 0206 03/31/22 0230   03/31/22 0145  cefTRIAXone (ROCEPHIN) 2 g in sodium chloride 0.9 % 100 mL IVPB        2 g 200 mL/hr over 30 Minutes Intravenous  Once 03/31/22 0131 03/31/22 0217   03/31/22 0145  azithromycin (ZITHROMAX) 500 mg in sodium chloride 0.9 % 250 mL IVPB        500 mg 250 mL/hr over 60 Minutes Intravenous  Once 03/31/22 0131 03/31/22 0246        Subjective: Seen and examined at bedside and he is still requiring supplemental oxygen.  Lactic acid level continues ago.  Patient states that he is feeling much better today on getting closer to baseline.  States that his cough is nonproductive is just a dry hacking cough now.  No other concerns or complaints at this time.  Objective: Vitals:   04/05/22 1111 04/05/22 1113 04/05/22 1519 04/05/22 1614  BP:  (!) 156/103  (!) 172/96  Pulse:  81  (!) 104  Resp:  20  16  Temp:  98.1 F (36.7 C)  97.6 F (36.4 C)  TempSrc:  Oral    SpO2: 94% 95% 97% 98%  Weight:      Height:        Intake/Output Summary (Last 24 hours) at 04/05/2022 1858 Last data filed at 04/05/2022 1800 Gross per 24 hour  Intake 1849.09 ml  Output 1425 ml  Net 424.09 ml   Filed Weights   04/03/22 0342 04/04/22 0258 04/05/22 0206  Weight: 87.4 kg 92 kg 83.2 kg   Examination: Physical Exam:  Constitutional: WN/WD obese Caucasian male currently no acute distress sitting in the chair Respiratory: Diminished to auscultation bilaterally with coarse breath sounds and has Some rhonchi and mild wheezing.  No appreciable rales or crackles.  Normal respiratory effort and patient is not tachypenic. No accessory muscle use.  Wearing supplemental oxygen via nasal cannula Cardiovascular: RRR, no murmurs / rubs / gallops. S1 and S2 auscultated.  Trace extremity edema Abdomen: Soft, non-tender, distended secondary to body habitus. Bowel sounds positive.  GU: Deferred. Musculoskeletal: No clubbing / cyanosis of digits/nails. No joint deformity upper and lower extremities.  Skin: No rashes, lesions, ulcers on limited skin evaluation. No induration; Warm and dry.  Neurologic: CN 2-12 grossly intact with no focal deficits. Romberg sign and cerebellar reflexes not assessed.  Psychiatric: Normal judgment and insight. Alert and oriented x 3. Normal mood and appropriate affect.   Data Reviewed: I have personally reviewed  following labs and imaging studies  CBC: Recent Labs  Lab 03/31/22 0034 03/31/22 0642 04/01/22 0446 04/02/22 0424 04/03/22 0621 04/04/22 0704 04/05/22 0317  WBC 2.3*   < > 4.3 2.7* 2.2* 2.0* 1.4*  NEUTROABS 1.0*  --  3.8 2.1  --  1.4* 0.8*  HGB 12.2*   < > 11.1* 11.7* 11.3* 12.7* 11.9*  HCT 36.9*   < > 34.4* 34.7* 34.1* 37.4* 35.1*  MCV 109.5*   < > 112.8* 109.1* 106.9* 104.8* 104.2*  PLT 138*   < > 107* 118* 117* 129* 110*   < > = values in this interval not displayed.   Basic Metabolic Panel: Recent Labs  Lab 04/01/22 0446  04/01/22 1704 04/02/22 0424 04/03/22 0621 04/04/22 0704 04/05/22 0317  NA 135 137 140 139 138 137  K 4.5 3.7 4.9 5.1 5.1 4.9  CL 102 104 104 105 105 104  CO2 22 20* 20* 22 21* 22  GLUCOSE 217* 142* 176* 156* 177* 160*  BUN 31* 36* 42* 41* 42* 37*  CREATININE 1.22 0.86 0.82 0.73 0.81 0.67  CALCIUM 8.7* 9.2 9.9 9.6 9.6 9.2  MG 2.4 2.5* 2.5* 2.2 2.2 2.1  PHOS 5.4* 2.6 2.6 2.9  --  4.2   GFR: Estimated Creatinine Clearance: 141.9 mL/min (by C-G formula based on SCr of 0.67 mg/dL). Liver Function Tests: Recent Labs  Lab 03/31/22 0034 03/31/22 0941 04/01/22 0440 04/05/22 0317  AST 40 35 35 50*  ALT 19 19 18  64*  ALKPHOS 76 62 50 81  BILITOT 2.5* 2.2* 1.5* 1.2  PROT 7.3 6.8 6.7 6.3*  ALBUMIN 3.9 3.6 3.4* 3.3*   No results for input(s): "LIPASE", "AMYLASE" in the last 168 hours. No results for input(s): "AMMONIA" in the last 168 hours. Coagulation Profile: Recent Labs  Lab 03/31/22 0034  INR 1.6*   Cardiac Enzymes: No results for input(s): "CKTOTAL", "CKMB", "CKMBINDEX", "TROPONINI" in the last 168 hours. BNP (last 3 results) No results for input(s): "PROBNP" in the last 8760 hours. HbA1C: Recent Labs    04/03/22 0621  HGBA1C 5.2   CBG: Recent Labs  Lab 04/04/22 1629 04/04/22 2029 04/05/22 0741 04/05/22 1114 04/05/22 1537  GLUCAP 222* 206* 153* 138* 140*   Lipid Profile: No results for input(s): "CHOL", "HDL",  "LDLCALC", "TRIG", "CHOLHDL", "LDLDIRECT" in the last 72 hours. Thyroid Function Tests: No results for input(s): "TSH", "T4TOTAL", "FREET4", "T3FREE", "THYROIDAB" in the last 72 hours. Anemia Panel: No results for input(s): "VITAMINB12", "FOLATE", "FERRITIN", "TIBC", "IRON", "RETICCTPCT" in the last 72 hours. Sepsis Labs: Recent Labs  Lab 03/31/22 0034 03/31/22 0240 04/05/22 0317 04/05/22 1314 04/05/22 1444  PROCALCITON 0.15  --   --   --   --   LATICACIDVEN 7.1* 4.0* 3.4* 4.6* 5.1*    Recent Results (from the past 240 hour(s))  Blood Culture (routine x 2)     Status: None   Collection Time: 03/31/22 12:34 AM   Specimen: BLOOD  Result Value Ref Range Status   Specimen Description BLOOD RIGHT ANTECUBITAL  Final   Special Requests   Final    BOTTLES DRAWN AEROBIC AND ANAEROBIC Blood Culture results may not be optimal due to an excessive volume of blood received in culture bottles   Culture   Final    NO GROWTH 5 DAYS Performed at [facility], [address] [place], [place] [address]    Report Status 04/05/2022 FINAL  Final  Resp panel by RT-PCR (RSV, Flu A&B, Covid) Anterior Nasal Swab     Status: Abnormal   Collection Time: 03/31/22 12:34 AM   Specimen: Anterior Nasal Swab  Result Value Ref Range Status   SARS Coronavirus 2 by RT PCR NEGATIVE NEGATIVE Final    Comment: (NOTE) SARS-CoV-2 target nucleic acids are NOT DETECTED.  The SARS-CoV-2 RNA is generally detectable in upper respiratory specimens during the acute phase of infection. The lowest concentration of SARS-CoV-2 viral copies this assay can detect is 138 copies/mL. A negative result does not preclude SARS-Cov-2 infection and should not be used as the sole basis for treatment or other patient management decisions. A negative result may occur with  improper specimen collection/handling, submission of specimen other than nasopharyngeal swab, presence  of viral mutation(s) within the areas targeted by  this assay, and inadequate number of viral copies(<138 copies/mL). A negative result must be combined with clinical observations, patient history, and epidemiological information. The expected result is Negative.  Fact Sheet for Patients:  EntrepreneurPulse.com.au  Fact Sheet for Healthcare Providers:  IncredibleEmployment.be  This test is no t yet approved or cleared by the [place] FDA and  has been authorized for detection and/or diagnosis of SARS-CoV-2 by FDA under an Emergency Use Authorization (EUA). This EUA will remain  in effect (meaning this test can be used) for the duration of the COVID-19 declaration under Section 564(b)(1) of the Act, 21 U.S.C.section 360bbb-3(b)(1), unless the authorization is terminated  or revoked sooner.       Influenza A by PCR POSITIVE (A) NEGATIVE Final   Influenza B by PCR NEGATIVE NEGATIVE Final    Comment: (NOTE) The Xpert Xpress SARS-CoV-2/FLU/RSV plus assay is intended as an aid in the diagnosis of influenza from Nasopharyngeal swab specimens and should not be used as a sole basis for treatment. Nasal washings and aspirates are unacceptable for Xpert Xpress SARS-CoV-2/FLU/RSV testing.  Fact Sheet for Patients: EntrepreneurPulse.com.au  Fact Sheet for Healthcare Providers: IncredibleEmployment.be  This test is not yet approved or cleared by the [place] FDA and has been authorized for detection and/or diagnosis of SARS-CoV-2 by FDA under an Emergency Use Authorization (EUA). This EUA will remain in effect (meaning this test can be used) for the duration of the COVID-19 declaration under Section 564(b)(1) of the Act, 21 U.S.C. section 360bbb-3(b)(1), unless the authorization is terminated or revoked.     Resp Syncytial Virus by PCR NEGATIVE NEGATIVE Final    Comment: (NOTE) Fact Sheet for Patients: EntrepreneurPulse.com.au  Fact  Sheet for Healthcare Providers: IncredibleEmployment.be  This test is not yet approved or cleared by the [place] FDA and has been authorized for detection and/or diagnosis of SARS-CoV-2 by FDA under an Emergency Use Authorization (EUA). This EUA will remain in effect (meaning this test can be used) for the duration of the COVID-19 declaration under Section 564(b)(1) of the Act, 21 U.S.C. section 360bbb-3(b)(1), unless the authorization is terminated or revoked.  Performed at [facility], [address] [place], [place] [address]   Blood Culture (routine x 2)     Status: None   Collection Time: 03/31/22 12:55 AM   Specimen: BLOOD  Result Value Ref Range Status   Specimen Description BLOOD LEFT ANTECUBITAL  Final   Special Requests   Final    BOTTLES DRAWN AEROBIC AND ANAEROBIC Blood Culture results may not be optimal due to an excessive volume of blood received in culture bottles   Culture   Final    NO GROWTH 5 DAYS Performed at [facility], [address] [place], [place] [address]    Report Status 04/05/2022 FINAL  Final  Urine Culture     Status: None   Collection Time: 03/31/22  2:59 AM   Specimen: Urine, Clean Catch  Result Value Ref Range Status   Specimen Description   Final    URINE, CLEAN CATCH Performed at [facility], [address] [place], [place] [address]    Special Requests   Final    NONE Performed at [facility], [address], [place], [place] [address]    Culture   Final    NO GROWTH Performed at [facility], [place] [address], [place], [place] [address]    Report Status  04/01/2022 FINAL  Final  Respiratory (~20 pathogens) panel by PCR     Status: Abnormal   Collection Time: 03/31/22  2:59 AM   Specimen: Urine, Clean Catch; Respiratory  Result Value Ref Range Status   Adenovirus NOT DETECTED NOT DETECTED Final   Coronavirus 229E NOT DETECTED NOT DETECTED Final    Comment:  (NOTE) The Coronavirus on the Respiratory Panel, DOES NOT test for the novel  Coronavirus (2019 nCoV)    Coronavirus HKU1 NOT DETECTED NOT DETECTED Final   Coronavirus NL63 NOT DETECTED NOT DETECTED Final   Coronavirus OC43 NOT DETECTED NOT DETECTED Final   Metapneumovirus NOT DETECTED NOT DETECTED Final   Rhinovirus / Enterovirus NOT DETECTED NOT DETECTED Final   Influenza A H1 2009 DETECTED (A) NOT DETECTED Final   Influenza B NOT DETECTED NOT DETECTED Final   Parainfluenza Virus 1 NOT DETECTED NOT DETECTED Final   Parainfluenza Virus 2 NOT DETECTED NOT DETECTED Final   Parainfluenza Virus 3 NOT DETECTED NOT DETECTED Final   Parainfluenza Virus 4 NOT DETECTED NOT DETECTED Final   Respiratory Syncytial Virus NOT DETECTED NOT DETECTED Final   Bordetella pertussis NOT DETECTED NOT DETECTED Final   Bordetella Parapertussis NOT DETECTED NOT DETECTED Final   Chlamydophila pneumoniae NOT DETECTED NOT DETECTED Final   Mycoplasma pneumoniae NOT DETECTED NOT DETECTED Final    Comment: Performed at [facility], [place]. [address] [place], [place] [address]  MRSA Next Gen by PCR, Nasal     Status: None   Collection Time: 03/31/22  4:45 AM   Specimen: Nasal Mucosa; Nasal Swab  Result Value Ref Range Status   MRSA by PCR Next Gen NOT DETECTED NOT DETECTED Final    Comment: (NOTE) The GeneXpert MRSA Assay (FDA approved for NASAL specimens only), is one component of a comprehensive MRSA colonization surveillance program. It is not intended to diagnose MRSA infection nor to guide or monitor treatment for MRSA infections. Test performance is not FDA approved in patients less than 56 years old. Performed at [facility], [address] [place], [place] [address]   Culture, Respiratory w Gram Stain     Status: None   Collection Time: 04/01/22 11:23 AM   Specimen: Tracheal Aspirate; Respiratory  Result Value Ref Range Status   Specimen Description   Final    TRACHEAL  ASPIRATE Performed at [facility], [address] [place], [place] [address]    Special Requests   Final    NONE Performed at [facility], [address] [place], [place] [address]    Gram Stain   Final    MODERATE WBC PRESENT, PREDOMINANTLY PMN NO ORGANISMS SEEN    Culture   Final    NO GROWTH 2 DAYS Performed at [facility] [address], [place], [place] [address]    Report Status 04/03/2022 FINAL  Final    Radiology Studies: DG Chest Port 1 View  Result Date: 04/05/2022 CLINICAL DATA:  Shortness of breath. EXAM: PORTABLE CHEST 1 VIEW COMPARISON:  One-view chest x-ray 04/01/2022 FINDINGS: The patient has been extubated. Gastric tube was removed. Cardiac silhouette remains enlarged. Overall lung volumes are slightly improved. Right greater than left interstitial and airspace opacities remain but are slightly improved. No significant effusions are present. IMPRESSION: 1. Interval extubation and removal of gastric tube. 2. Slight improved lung volumes. 3. Persistent right greater than left interstitial and airspace opacities with continued slight improvement from previous studies. Electronically Signed   By:  San Morelle M.D.   On: 04/05/2022 07:43    Scheduled Meds:  feeding supplement  237 mL Oral BID BM   guaiFENesin  1,200 mg Oral BID   heparin  5,000 Units Subcutaneous Q8H   insulin aspart  0-9 Units Subcutaneous TID WC   ipratropium  0.5 mg Nebulization TID   levalbuterol  0.63 mg Nebulization TID   menthol-cetylpyridinium  1 lozenge Oral TID   methylPREDNISolone (SOLU-MEDROL) injection  40 mg Intravenous BID   multivitamin with minerals  1 tablet Oral Daily   pantoprazole (PROTONIX) IV  40 mg Intravenous Q24H   Continuous Infusions:  azithromycin 500 mg (04/05/22 0319)   cefTRIAXone (ROCEPHIN)  IV 200 mL/hr at 04/05/22 1112    LOS: 5 days   Raiford Noble, DO [facility] Available via Epic secure chat 7am-7pm After these  hours, please refer to coverage provider listed on [email or web address] 04/05/2022, 6:58 PM

## 2022-04-05 NOTE — Evaluation (Signed)
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 EMS was called due to respiratory distress. Patient's girlfriend reported that patient been complaining of bilateral lower extremity edema, flu-like symptoms with worsening shortness of breath.  On EMS arrival patient was found with RR40-50's, sats 90% on room air. He was treated with 125 mg of Solu-Medrol, LR 500 mL and DuoNeb x 2 and transported to [facility]. Initial vital signs showed HR of 150 beats/minute, BP 168/75 mm Hg, the RR 28 breaths/minute, and the oxygen saturation 92 % on 3L and a temperature of 103.8F (39.5C). Patient's condition deteriorated; he was noted to be in acute hypoxic and hypercapnic respiratory failure secondary to influenza A infection and possible superimposed bacterial infection, subsequently intubated and admitted to the critical care service. Patient improved clinically, extubated, O2 sats improved and transferred to the hospitalist service.  Clinical Impression  The pt presents this session with continued need for supplemental oxygen (2L) during mobility in order to maintain oxygen saturation >90%. The pt was able to ambulate 50'x2 with seated rest break between bouts. He demonstrates LE endurance deficits and higher level balance deficits in standing. The pt would be a good candidate for a SPC, however refused education. Pt is expected to d/c home with family care and outpatient PT once medically stable.        Recommendations for follow up therapy are one component of a multi-disciplinary  discharge planning process, led by the attending physician.  Recommendations may be updated based on patient status, additional functional criteria and insurance authorization.  Follow Up Recommendations Outpatient PT      Assistance Recommended at Discharge PRN  Patient can return home with the following  A little help with walking and/or transfers    Equipment Recommendations    Recommendations for Other Services       Functional Status Assessment Patient has had a recent decline in their functional status and demonstrates the ability to make significant improvements in function in a reasonable and predictable amount of time.     Precautions / Restrictions Precautions Precautions: Fall Precaution Comments: droplet precautions      Mobility  Bed Mobility Overal bed mobility:  (Pt upright in bedside chair upon arrival.)                  Transfers Overall transfer level: Needs assistance Equipment used: None Transfers: Sit to/from Stand Sit to Stand: Supervision                Ambulation/Gait Ambulation/Gait assistance: Min guard Gait Distance (Feet): 100 Feet Assistive device:  (use of unilateral handrail in the hallway.) Gait Pattern/deviations: Knee flexed in stance - left, Knee flexed in stance - right          Stairs            Wheelchair Mobility    Modified Rankin (Stroke Patients Only)       Balance Overall balance assessment: Needs assistance   Sitting balance-Leahy Scale: Good     Standing balance support: During functional activity, Single extremity supported Standing balance-Leahy Scale: Fair  Pertinent Vitals/Pain Pain Assessment Pain Assessment: No/denies pain    Home Living Family/patient expects to be discharged to:: Private residence Living Arrangements: Spouse/significant other Available Help at Discharge: Family;Available PRN/intermittently Type of Home: House Home  Access: Stairs to enter   Entrance Stairs-Number of Steps: 4     Home Equipment: Rollator (4 wheels)      Prior Function Prior Level of Function : Working/employed;Independent/Modified Independent             Mobility Comments: no falls       Hand Dominance        Extremity/Trunk Assessment   Upper Extremity Assessment Upper Extremity Assessment: Overall WFL for tasks assessed    Lower Extremity Assessment Lower Extremity Assessment: Overall WFL for tasks assessed;RLE deficits/detail;LLE deficits/detail RLE Deficits / Details: hip flexor 4/5; all else 5/5; poor endurance. LLE Deficits / Details: hip flexor 4/5; all else 5/5; poor endurance.       Communication   Communication: No difficulties  Cognition Arousal/Alertness: Awake/alert Behavior During Therapy: WFL for tasks assessed/performed Overall Cognitive Status: Within Functional Limits for tasks assessed                                          General Comments      Exercises Other Exercises Other Exercises: Pt educated on benefits of using a cane; refused DME cane training; encouraged pt to use rollator at d/c.   Assessment/Plan    PT Assessment Patient needs continued PT services  PT Problem List Decreased mobility;Decreased activity tolerance;Decreased balance       PT Treatment Interventions Gait training;Balance training;Therapeutic exercise;Functional mobility training;Therapeutic activities;Stair training    PT Goals (Current goals can be found in the Care Plan section)  Acute Rehab PT Goals Patient Stated Goal: To return home to get well PT Goal Formulation: With patient Time For Goal Achievement: 04/19/22 Potential to Achieve Goals: Good    Frequency Min 2X/week     Co-evaluation               AM-PAC PT "6 Clicks" Mobility  Outcome Measure Help needed turning from your back to your side while in a flat bed without using bedrails?: A Little Help needed  moving from lying on your back to sitting on the side of a flat bed without using bedrails?: A Little Help needed moving to and from a bed to a chair (including a wheelchair)?: A Little Help needed standing up from a chair using your arms (e.g., wheelchair or bedside chair)?: A Little Help needed to walk in hospital room?: A Little Help needed climbing 3-5 steps with a railing? : A Little 6 Click Score: 18    End of Session Equipment Utilized During Treatment: Oxygen Activity Tolerance: Patient tolerated treatment well;Patient limited by fatigue Patient left: in chair;with call bell/phone within reach Nurse Communication: Mobility status PT Visit Diagnosis: Unsteadiness on feet (R26.81)    Time: 3570-1779 PT Time Calculation (min) (ACUTE ONLY): 22 min   Charges:   PT Evaluation $PT Eval Low Complexity: 1 Low PT Treatments $Gait Training: 8-22 mins        3:35 PM, 04/05/22 Minahil Quinlivan A. Mordecai Maes PT, DPT Physical Therapist - [facility] A Zekiah Coen 04/05/2022, 3:33 PM

## 2022-04-06 ENCOUNTER — Inpatient Hospital Stay: Payer: Self-pay

## 2022-04-06 LAB — GLUCOSE, CAPILLARY
Glucose-Capillary: 133 mg/dL — ABNORMAL HIGH (ref 70–99)
Glucose-Capillary: 118 mg/dL — ABNORMAL HIGH (ref 70–99)
Glucose-Capillary: 154 mg/dL — ABNORMAL HIGH (ref 70–99)
Glucose-Capillary: 152 mg/dL — ABNORMAL HIGH (ref 70–99)

## 2022-04-06 LAB — CBC WITH DIFFERENTIAL/PLATELET
Abs Immature Granulocytes: 0.09 10*3/uL — ABNORMAL HIGH (ref 0.00–0.07)
Basophils Absolute: 0 10*3/uL (ref 0.0–0.1)
Basophils Relative: 0 %
Eosinophils Absolute: 0 10*3/uL (ref 0.0–0.5)
Eosinophils Relative: 0 %
HCT: 33 % — ABNORMAL LOW (ref 39.0–52.0)
Hemoglobin: 11.3 g/dL — ABNORMAL LOW (ref 13.0–17.0)
Immature Granulocytes: 5 %
Lymphocytes Relative: 21 %
Lymphs Abs: 0.4 10*3/uL — ABNORMAL LOW (ref 0.7–4.0)
MCH: 35.5 pg — ABNORMAL HIGH (ref 26.0–34.0)
MCHC: 34.2 g/dL (ref 30.0–36.0)
MCV: 103.8 fL — ABNORMAL HIGH (ref 80.0–100.0)
Monocytes Absolute: 0.1 10*3/uL (ref 0.1–1.0)
Monocytes Relative: 8 %
Neutro Abs: 1.1 10*3/uL — ABNORMAL LOW (ref 1.7–7.7)
Neutrophils Relative %: 66 %
Platelets: 113 10*3/uL — ABNORMAL LOW (ref 150–400)
RBC: 3.18 MIL/uL — ABNORMAL LOW (ref 4.22–5.81)
RDW: 14.6 % (ref 11.5–15.5)
Smear Review: NORMAL
WBC: 1.7 10*3/uL — ABNORMAL LOW (ref 4.0–10.5)
nRBC: 5.9 % — ABNORMAL HIGH (ref 0.0–0.2)

## 2022-04-06 LAB — COMPREHENSIVE METABOLIC PANEL
ALT: 88 U/L — ABNORMAL HIGH (ref 0–44)
AST: 68 U/L — ABNORMAL HIGH (ref 15–41)
Albumin: 3.5 g/dL (ref 3.5–5.0)
Alkaline Phosphatase: 101 U/L (ref 38–126)
Anion gap: 13 (ref 5–15)
BUN: 28 mg/dL — ABNORMAL HIGH (ref 6–20)
CO2: 19 mmol/L — ABNORMAL LOW (ref 22–32)
Calcium: 9 mg/dL (ref 8.9–10.3)
Chloride: 106 mmol/L (ref 98–111)
Creatinine, Ser: 0.66 mg/dL (ref 0.61–1.24)
GFR, Estimated: >60 mL/min (ref >60)
Glucose, Bld: 153 mg/dL — ABNORMAL HIGH (ref 70–99)
Potassium: 4.4 mmol/L (ref 3.5–5.1)
Sodium: 138 mmol/L (ref 135–145)
Total Bilirubin: 1.1 mg/dL (ref 0.3–1.2)
Total Protein: 6.1 g/dL — ABNORMAL LOW (ref 6.5–8.1)

## 2022-04-06 LAB — LACTIC ACID, PLASMA
Lactic Acid, Venous: 3.8 mmol/L (ref 0.5–1.9)
Lactic Acid, Venous: 2.7 mmol/L (ref 0.5–1.9)

## 2022-04-06 LAB — LEGIONELLA PNEUMOPHILA SEROGP 1 UR AG: L. pneumophila Serogp 1 Ur Ag: NEGATIVE

## 2022-04-06 LAB — MISC LABCORP TEST (SEND OUT): Labcorp test code: 141330

## 2022-04-06 LAB — PHOSPHORUS: Phosphorus: 3.9 mg/dL (ref 2.5–4.6)

## 2022-04-06 LAB — MAGNESIUM: Magnesium: 2 mg/dL (ref 1.7–2.4)

## 2022-04-06 MED ORDER — HYDRALAZINE HCL 20 MG/ML IJ SOLN
10.0000 mg | Freq: Four times a day (QID) | INTRAMUSCULAR | Status: DC | PRN
  Administered 2022-04-06 – 2022-04-07 (×4): 10 mg via INTRAVENOUS
  Filled 2022-04-06 (×4): qty 1

## 2022-04-06 MED ORDER — PIPERACILLIN-TAZOBACTAM 3.375 G IVPB
3.3750 g | Freq: Three times a day (TID) | INTRAVENOUS | Status: DC
  Administered 2022-04-06 – 2022-04-07 (×3): 3.375 g via INTRAVENOUS
  Filled 2022-04-06 (×3): qty 50

## 2022-04-06 MED ORDER — PREDNISONE 50 MG PO TABS
60.0000 mg | ORAL_TABLET | Freq: Every day | ORAL | Status: DC
  Administered 2022-04-07: 60 mg via ORAL
  Filled 2022-04-06: qty 1

## 2022-04-06 MED ORDER — SODIUM BICARBONATE 650 MG PO TABS
650.0000 mg | ORAL_TABLET | Freq: Two times a day (BID) | ORAL | Status: DC
  Administered 2022-04-06 – 2022-04-07 (×3): 650 mg via ORAL
  Filled 2022-04-06 (×3): qty 1

## 2022-04-06 NOTE — Progress Notes (Signed)
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   Per ED reports, EMS was called due to respiratory distress.  Patient's girlfriend reported that patient been complaining of bilateral lower extremity edema, flu-like symptoms with worsening shortness of breath.  On EMS arrival patient was found with RR40-50's, sats 90% on room air.  He was treated with 125 mg of Solu-Medrol, LR 500 mL and DuoNeb x 2.   ED Course: Initial vital signs showed HR of 150 beats/minute, BP 168/75 mm Hg, the RR 28 breaths/minute, and the oxygen saturation 92 % on 3L and a temperature of 103.23F (39.5C).    Patient's condition deteriorated noted to be in acute hypoxic and hypercapnic respiratory failure secondary to influenza A infection and possible superimposed bacterial infection, subsequently intubated and admitted to the critical care service.  Patient improved clinically, extubated, O2 sats improved and transferred to the hospitalist service.   **The patient's lactic acid started trending back up again so able given IV fluid hydration.  He continues to desaturate as well on room air at rest and use at least 3 L of supplemental oxygen will need continued treatment and will give another dose of IV Solu-Medrol today.  May need to go home on oxygen short-term.  PT OT recommending outpatient PT  Given that he continues to have pneumonia on chest x-ray and continues to be symptomatic we will change antibiotics to IV Zosyn.  Lactic acid level is trending down and will evaluate his liver function  further.  Assessment and Plan:  Acute respiratory failure with hypoxia secondary to influenza A pneumonia, multifocal pneumonia and possible EVALI -Patient noted to have presented and admitted with acute hypoxic and hypercapnic respiratory failure secondary to influenza A infection and possible bacterial infection. -Patient admitted, intubated under the critical care service. -CT angiogram chest done with perihilar predominant bilateral groundglass and consolidative opacities nonspecific but can be seen in the setting of multifocal pneumonia, ARDS or pulmonary edema. -SpO2: 92 % O2 Flow Rate (L/min): 3 L/min FiO2 (%): 35 % -Chest x-ray done with interval development of perihilar airspace infiltrate in keeping with perihilar alveolar pulmonary edema multifocal infection. -2D echo done with a EF of 60 to 65%,NWMA. -Patient placed on IV Solu-Medrol for treatment of EVALI; was getting IV 40 mg twice daily and will switch to 60 mg po Daily in the AM likely -ANA/ANCA/anti-GBM ordered per critical care service and Negative . -Continue current steroids taper as recommended by PCCM. -Continue Mucinex, scheduled nebulizers, Tamiflu, PPI, empiric IV azithromycin and IV Rocephin now changed to IV Zosyn. -Continuous pulse oximetry maintain O2 saturation greater than 90% -Continue supplemental oxygen via nasal cannula and wean O2 as tolerated -C/w Supportive care. -C/w Hycodan and Guaifenesin 1200 mg po BID -Will C/w Xopenex 0.63 mg Neb TID -Repeat chest x-ray today done and showed "BILATERAL airspace infiltrates slightly increased on LEFT versus previous exam."  -Will get PT/OT to evaluate and treat and will need an ambulatory home O2 screen; OT recommending outpatient physical therapy and he did desaturate yesterday and will need to repeat   Influenza A -Continue Tamiflu, bronchodilators, antitussives, steroids.   -  Place on Cepacol lozenges. -Having some mild Hemoptysis    Acute kidney  injury Metabolic Acidosis, mild -Patient noted to have presented with mild acute kidney injury per pulmonary. -Patient noted to have had significant proteinuria on presentation and urinalysis, concern for nephrotic syndrome versus vasculitic process versus diabetes induced nephropathy. Recent Labs  Lab 04/01/22 0446 04/01/22 1704 04/02/22 0424 04/03/22 0621 04/04/22 0704 04/05/22 0317 04/06/22 0319  BUN 31* 36* 42* 41* 42* 37* 28*  CREATININE 1.22 0.86 0.82 0.73 0.81 0.67 0.66  -Patient has a metabolic acidosis with a CO2 of 19, anion gap of 13, chloride level of 106 -ANA/ANCA/anti-GBM and the autoimmune studies were negative. -Nephrology consulted with the following. -Continue empiric steroids. -Further care Per nephrology they have signed off the case   Lactic Acidosis -Unclear etiology and is trending up so we will given IV fluid boluses and given 1.5 L boluses today.   -Current Lactic Acidosis Trend 3.4 -> 4.6 -> 5.1 -> 4.8 -> 4.5 -> 3.8 -> 2.7 -Continue to Monitor and Trend -Repeat CMP in the AM    Acute metabolic encephalopathy, improved  -Likely secondary to problem #1 and #2. -Improved with treatment of problem #1. -Resolved.   History of liver disease/concern for underlying cirrhosis -Patient with thrombocytopenia, liver contour smooth on imaging. -Patient with no overt bleeding. -Check RUQ U/S given Abnormal LFTs Recent Labs  Lab 03/31/22 0034 03/31/22 0941 04/01/22 0440 04/05/22 0317 04/06/22 0319  AST 40 35 35 50* 68*  ALT 19 19 18  64* 88*  -Continue to Monitor and Trend and repeat LFTs in the AM    Pancytopenia -Likely secondary to underlying cirrhosis. -Has a Macrocytosis  -Patient with no overt bleeding. Recent Labs  Lab 03/31/22 0642 04/01/22 0446 04/02/22 0424 04/03/22 0621 04/04/22 0704 04/05/22 0317 04/06/22 0319  WBC 1.6* 4.3 2.7* 2.2* 2.0* 1.4* 1.7*  HGB 11.0* 11.1* 11.7* 11.3* 12.7* 11.9* 11.3*  HCT 32.6* 34.4* 34.7* 34.1* 37.4*  35.1* 33.0*  PLT 88* 107* 118* 117* 129* 110* 113*  -Continue IV antibiotics and escalated to IV Zosyn -Repeat labs in the AM.   Obesity -Complicates overall prognosis and care -Estimated body mass index is 30.5 kg/m as calculated from the following:   Height as of this encounter: 5\' 8"  (1.727 m).   Weight as of this encounter: 91 kg.  -Weight Loss and Dietary Counseling given  DVT prophylaxis: heparin injection 5,000 Units Start: 03/31/22 0600    Code Status: Full Code Family Communication: Discussed with fianc at bedside  Disposition Plan:  Level of care: Telemetry Medical Status is: Inpatient Remains inpatient appropriate because: LFTs are abnormal and he continues to have an oxygen requirement.  Will repeat his amatory home O2 screen in the a.m.   Consultants:  PCCM. transfer Nephrology  Procedures:  As Delineated as above  Antimicrobials:  Anti-infectives (From admission, onward)    Start     Dose/Rate Route Frequency Ordered Stop   04/06/22 2200  piperacillin-tazobactam (ZOSYN) IVPB 3.375 g        3.375 g 12.5 mL/hr over 240 Minutes Intravenous Every 8 hours 04/06/22 1013     04/04/22 0200  cefTRIAXone (ROCEPHIN) 2 g in sodium chloride 0.9 % 100 mL IVPB  Status:  Discontinued        2 g 200 mL/hr over 30 Minutes Intravenous Every 24 hours 04/03/22 0928 04/06/22 1013   04/01/22 0200  cefTRIAXone (ROCEPHIN) 1 g in sodium chloride 0.9 % 100 mL IVPB  Status:  Discontinued  1 g 200 mL/hr over 30 Minutes Intravenous Every 24 hours 03/31/22 0610 04/03/22 0928   04/01/22 0200  azithromycin (ZITHROMAX) 500 mg in sodium chloride 0.9 % 250 mL IVPB  Status:  Discontinued        500 mg 250 mL/hr over 60 Minutes Intravenous Every 24 hours 03/31/22 0610 04/06/22 1013   03/31/22 2200  oseltamivir (TAMIFLU) capsule 75 mg        75 mg Per Tube 2 times daily 03/31/22 1114 04/05/22 0948   03/31/22 0245  oseltamivir (TAMIFLU) capsule 75 mg  Status:  Discontinued        75 mg  Oral 2 times daily 03/31/22 0230 03/31/22 1114   03/31/22 0215  oseltamivir (TAMIFLU) capsule 75 mg  Status:  Discontinued        75 mg Oral Daily 03/31/22 0206 03/31/22 0230   03/31/22 0145  cefTRIAXone (ROCEPHIN) 2 g in sodium chloride 0.9 % 100 mL IVPB        2 g 200 mL/hr over 30 Minutes Intravenous  Once 03/31/22 0131 03/31/22 0217   03/31/22 0145  azithromycin (ZITHROMAX) 500 mg in sodium chloride 0.9 % 250 mL IVPB        500 mg 250 mL/hr over 60 Minutes Intravenous  Once 03/31/22 0131 03/31/22 0246        Subjective: Seen and examined at bedside he still requiring oxygen.  Not really using his incentive or flutter valve.  Lactic acid level is trending down.  He continues to feel good does have a dry hacking cough.  Will need repeat amatory home O2 screen and will do this in the morning.  Chest x-ray showed worsening of his pneumonia so we will broaden his antibiotics to Zosyn  Objective: Vitals:   04/06/22 1535 04/06/22 1815 04/06/22 1849 04/06/22 2020  BP: (!) 177/104 (!) 176/95 (!) 171/90 (!) 169/107  Pulse: 94 83 98 (!) 101  Resp: 20   17  Temp: 98.9 F (37.2 C)   98.5 F (36.9 C)  TempSrc: Oral     SpO2: 95%   92%  Weight:      Height:        Intake/Output Summary (Last 24 hours) at 04/06/2022 2129 Last data filed at 04/06/2022 1821 Gross per 24 hour  Intake --  Output 1600 ml  Net -1600 ml   Filed Weights   04/04/22 0258 04/05/22 0206 04/06/22 0345  Weight: 92 kg 83.2 kg 91 kg   Examination: Physical Exam:  Constitutional: WN/WD obese Caucasian male currently no acute distress Respiratory: Diminished to auscultation bilaterally with some coarse breath sounds and some rhonchi, no wheezing, rales, or crackles. Normal respiratory effort and patient is not tachypenic. No accessory muscle use.  Wearing supplemental oxygen via nasal cannula Cardiovascular: RRR, no murmurs / rubs / gallops. S1 and S2 auscultated.  Trace extremity edema Abdomen: Soft, non-tender,  distended secondary to body habitus. Bowel sounds positive.  GU: Deferred. Musculoskeletal: No clubbing / cyanosis of digits/nails. No joint deformity upper and lower extremities. Skin: No rashes, lesions, ulcers on limited skin evaluation. No induration; Warm and dry.  Neurologic: CN 2-12 grossly intact with no focal deficits. Romberg sign and cerebellar reflexes not assessed.  Psychiatric: Normal judgment and insight. Alert and oriented x 3. Normal mood and appropriate affect.   Data Reviewed: I have personally reviewed following labs and imaging studies  CBC: Recent Labs  Lab 04/01/22 0446 04/02/22 0424 04/03/22 0621 04/04/22 0704 04/05/22 0317 04/06/22 0319  WBC  4.3 2.7* 2.2* 2.0* 1.4* 1.7*  NEUTROABS 3.8 2.1  --  1.4* 0.8* 1.1*  HGB 11.1* 11.7* 11.3* 12.7* 11.9* 11.3*  HCT 34.4* 34.7* 34.1* 37.4* 35.1* 33.0*  MCV 112.8* 109.1* 106.9* 104.8* 104.2* 103.8*  PLT 107* 118* 117* 129* 110* 113*   Basic Metabolic Panel: Recent Labs  Lab 04/01/22 1704 04/02/22 0424 04/03/22 0621 04/04/22 0704 04/05/22 0317 04/06/22 0319  NA 137 140 139 138 137 138  K 3.7 4.9 5.1 5.1 4.9 4.4  CL 104 104 105 105 104 106  CO2 20* 20* 22 21* 22 19*  GLUCOSE 142* 176* 156* 177* 160* 153*  BUN 36* 42* 41* 42* 37* 28*  CREATININE 0.86 0.82 0.73 0.81 0.67 0.66  CALCIUM 9.2 9.9 9.6 9.6 9.2 9.0  MG 2.5* 2.5* 2.2 2.2 2.1 2.0  PHOS 2.6 2.6 2.9  --  4.2 3.9   GFR: Estimated Creatinine Clearance: 147.8 mL/min (by C-G formula based on SCr of 0.66 mg/dL). Liver Function Tests: Recent Labs  Lab 03/31/22 0034 03/31/22 0941 04/01/22 0440 04/05/22 0317 04/06/22 0319  AST 40 35 35 50* 68*  ALT 19 19 18  64* 88*  ALKPHOS 76 62 50 81 101  BILITOT 2.5* 2.2* 1.5* 1.2 1.1  PROT 7.3 6.8 6.7 6.3* 6.1*  ALBUMIN 3.9 3.6 3.4* 3.3* 3.5   No results for input(s): "LIPASE", "AMYLASE" in the last 168 hours. No results for input(s): "AMMONIA" in the last 168 hours. Coagulation Profile: Recent Labs  Lab  03/31/22 0034  INR 1.6*   Cardiac Enzymes: No results for input(s): "CKTOTAL", "CKMB", "CKMBINDEX", "TROPONINI" in the last 168 hours. BNP (last 3 results) No results for input(s): "PROBNP" in the last 8760 hours. HbA1C: No results for input(s): "HGBA1C" in the last 72 hours. CBG: Recent Labs  Lab 04/05/22 1537 04/05/22 2007 04/06/22 0742 04/06/22 1207 04/06/22 1557  GLUCAP 140* 196* 133* 118* 154*   Lipid Profile: No results for input(s): "CHOL", "HDL", "LDLCALC", "TRIG", "CHOLHDL", "LDLDIRECT" in the last 72 hours. Thyroid Function Tests: No results for input(s): "TSH", "T4TOTAL", "FREET4", "T3FREE", "THYROIDAB" in the last 72 hours. Anemia Panel: No results for input(s): "VITAMINB12", "FOLATE", "FERRITIN", "TIBC", "IRON", "RETICCTPCT" in the last 72 hours. Sepsis Labs: Recent Labs  Lab 03/31/22 0034 03/31/22 0240 04/05/22 1847 04/05/22 2159 04/06/22 0939 04/06/22 1220  PROCALCITON 0.15  --   --   --   --   --   LATICACIDVEN 7.1*   < > 4.8* 4.5* 3.8* 2.7*   < > = values in this interval not displayed.    Recent Results (from the past 240 hour(s))  Blood Culture (routine x 2)     Status: None   Collection Time: 03/31/22 12:34 AM   Specimen: BLOOD  Result Value Ref Range Status   Specimen Description BLOOD RIGHT ANTECUBITAL  Final   Special Requests   Final    BOTTLES DRAWN AEROBIC AND ANAEROBIC Blood Culture results may not be optimal due to an excessive volume of blood received in culture bottles   Culture   Final    NO GROWTH 5 DAYS Performed at [facility], [address] [place], [place]    Report Status 04/05/2022 FINAL  Final  Resp panel by RT-PCR (RSV, Flu A&B, Covid) Anterior Nasal Swab     Status: Abnormal   Collection Time: 03/31/22 12:34 AM   Specimen: Anterior Nasal Swab  Result Value Ref Range Status   SARS Coronavirus 2 by RT PCR NEGATIVE NEGATIVE Final  Comment: (NOTE) SARS-CoV-2 target nucleic acids are NOT  DETECTED.  The SARS-CoV-2 RNA is generally detectable in upper respiratory specimens during the acute phase of infection. The lowest concentration of SARS-CoV-2 viral copies this assay can detect is 138 copies/mL. A negative result does not preclude SARS-Cov-2 infection and should not be used as the sole basis for treatment or other patient management decisions. A negative result may occur with  improper specimen collection/handling, submission of specimen other than nasopharyngeal swab, presence of viral mutation(s) within the areas targeted by this assay, and inadequate number of viral copies(<138 copies/mL). A negative result must be combined with clinical observations, patient history, and epidemiological information. The expected result is Negative.  Fact Sheet for Patients:  EntrepreneurPulse.com.au  Fact Sheet for Healthcare Providers:  IncredibleEmployment.be  This test is no t yet approved or cleared by the [place] FDA and  has been authorized for detection and/or diagnosis of SARS-CoV-2 by FDA under an Emergency Use Authorization (EUA). This EUA will remain  in effect (meaning this test can be used) for the duration of the COVID-19 declaration under Section 564(b)(1) of the Act, 21 U.S.C.section 360bbb-3(b)(1), unless the authorization is terminated  or revoked sooner.       Influenza A by PCR POSITIVE (A) NEGATIVE Final   Influenza B by PCR NEGATIVE NEGATIVE Final    Comment: (NOTE) The Xpert Xpress SARS-CoV-2/FLU/RSV plus assay is intended as an aid in the diagnosis of influenza from Nasopharyngeal swab specimens and should not be used as a sole basis for treatment. Nasal washings and aspirates are unacceptable for Xpert Xpress SARS-CoV-2/FLU/RSV testing.  Fact Sheet for Patients: EntrepreneurPulse.com.au  Fact Sheet for Healthcare Providers: IncredibleEmployment.be  This test is not  yet approved or cleared by the [place] FDA and has been authorized for detection and/or diagnosis of SARS-CoV-2 by FDA under an Emergency Use Authorization (EUA). This EUA will remain in effect (meaning this test can be used) for the duration of the COVID-19 declaration under Section 564(b)(1) of the Act, 21 U.S.C. section 360bbb-3(b)(1), unless the authorization is terminated or revoked.     Resp Syncytial Virus by PCR NEGATIVE NEGATIVE Final    Comment: (NOTE) Fact Sheet for Patients: EntrepreneurPulse.com.au  Fact Sheet for Healthcare Providers: IncredibleEmployment.be  This test is not yet approved or cleared by the [place] FDA and has been authorized for detection and/or diagnosis of SARS-CoV-2 by FDA under an Emergency Use Authorization (EUA). This EUA will remain in effect (meaning this test can be used) for the duration of the COVID-19 declaration under Section 564(b)(1) of the Act, 21 U.S.C. section 360bbb-3(b)(1), unless the authorization is terminated or revoked.  Performed at [facility], [address] [place], [place] [address]   Blood Culture (routine x 2)     Status: None   Collection Time: 03/31/22 12:55 AM   Specimen: BLOOD  Result Value Ref Range Status   Specimen Description BLOOD LEFT ANTECUBITAL  Final   Special Requests   Final    BOTTLES DRAWN AEROBIC AND ANAEROBIC Blood Culture results may not be optimal due to an excessive volume of blood received in culture bottles   Culture   Final    NO GROWTH 5 DAYS Performed at [facility], [address], [place], [place] [address]    Report Status 04/05/2022 FINAL  Final  Urine Culture     Status: None   Collection Time: 03/31/22  2:59 AM   Specimen: Urine, Clean Catch  Result Value  Ref Range Status   Specimen Description   Final    URINE, CLEAN CATCH Performed at [facility], [address] [place], [place] [address]     Special Requests   Final    NONE Performed at [facility], [address] [place], [place] [address]    Culture   Final    NO GROWTH Performed at [facility], [address] [place], [place] [address]    Report Status 04/01/2022 FINAL  Final  Respiratory (~20 pathogens) panel by PCR     Status: Abnormal   Collection Time: 03/31/22  2:59 AM   Specimen: Urine, Clean Catch; Respiratory  Result Value Ref Range Status   Adenovirus NOT DETECTED NOT DETECTED Final   Coronavirus 229E NOT DETECTED NOT DETECTED Final    Comment: (NOTE) The Coronavirus on the Respiratory Panel, DOES NOT test for the novel  Coronavirus (2019 nCoV)    Coronavirus HKU1 NOT DETECTED NOT DETECTED Final   Coronavirus NL63 NOT DETECTED NOT DETECTED Final   Coronavirus OC43 NOT DETECTED NOT DETECTED Final   Metapneumovirus NOT DETECTED NOT DETECTED Final   Rhinovirus / Enterovirus NOT DETECTED NOT DETECTED Final   Influenza A H1 2009 DETECTED (A) NOT DETECTED Final   Influenza B NOT DETECTED NOT DETECTED Final   Parainfluenza Virus 1 NOT DETECTED NOT DETECTED Final   Parainfluenza Virus 2 NOT DETECTED NOT DETECTED Final   Parainfluenza Virus 3 NOT DETECTED NOT DETECTED Final   Parainfluenza Virus 4 NOT DETECTED NOT DETECTED Final   Respiratory Syncytial Virus NOT DETECTED NOT DETECTED Final   Bordetella pertussis NOT DETECTED NOT DETECTED Final   Bordetella Parapertussis NOT DETECTED NOT DETECTED Final   Chlamydophila pneumoniae NOT DETECTED NOT DETECTED Final   Mycoplasma pneumoniae NOT DETECTED NOT DETECTED Final    Comment: Performed at [facility], [address] [place], [place] [address]  MRSA Next Gen by PCR, Nasal     Status: None   Collection Time: 03/31/22  4:45 AM   Specimen: Nasal Mucosa; Nasal Swab  Result Value Ref Range Status   MRSA by PCR Next Gen NOT DETECTED NOT DETECTED Final    Comment: (NOTE) The GeneXpert MRSA Assay (FDA approved for NASAL specimens only), is  one component of a comprehensive MRSA colonization surveillance program. It is not intended to diagnose MRSA infection nor to guide or monitor treatment for MRSA infections. Test performance is not FDA approved in patients less than 30 years old. Performed at [facility], [address] [place], [place] [address]   Culture, Respiratory w Gram Stain     Status: None   Collection Time: 04/01/22 11:23 AM   Specimen: Tracheal Aspirate; Respiratory  Result Value Ref Range Status   Specimen Description   Final    TRACHEAL ASPIRATE Performed at [facility], [address] [place], [place] [address]    Special Requests   Final    NONE Performed at [facility], [address] [place], [place] [address]    Gram Stain   Final    MODERATE WBC PRESENT, PREDOMINANTLY PMN NO ORGANISMS SEEN    Culture   Final    NO GROWTH 2 DAYS Performed at [facility], [address] [place], [place] [address]    Report Status 04/03/2022 FINAL  Final   Radiology Studies: DG Chest Port 1 View  Result Date: 04/06/2022 CLINICAL DATA:  Shortness of breath EXAM: PORTABLE CHEST 1  VIEW COMPARISON:  Portable exam 0412 hours compared to 04/05/2022 FINDINGS: Borderline enlargement of cardiac silhouette. Stable mediastinal contours. Diffuse BILATERAL airspace infiltrates, slightly increased on LEFT since previous exam. No pleural effusion or pneumothorax. No osseous abnormalities. IMPRESSION: BILATERAL airspace infiltrates slightly increased on LEFT versus previous exam. Electronically Signed   By: Lavonia Dana M.D.   On: 04/06/2022 08:16   DG Chest Port 1 View  Result Date: 04/05/2022 CLINICAL DATA:  Shortness of breath. EXAM: PORTABLE CHEST 1 VIEW COMPARISON:  One-view chest x-ray 04/01/2022 FINDINGS: The patient has been extubated. Gastric tube was removed. Cardiac silhouette remains enlarged. Overall lung volumes are slightly improved. Right greater than left interstitial  and airspace opacities remain but are slightly improved. No significant effusions are present. IMPRESSION: 1. Interval extubation and removal of gastric tube. 2. Slight improved lung volumes. 3. Persistent right greater than left interstitial and airspace opacities with continued slight improvement from previous studies. Electronically Signed   By: San Morelle M.D.   On: 04/05/2022 07:43    Scheduled Meds:  feeding supplement  237 mL Oral BID BM   guaiFENesin  1,200 mg Oral BID   heparin  5,000 Units Subcutaneous Q8H   insulin aspart  0-9 Units Subcutaneous TID WC   ipratropium  0.5 mg Nebulization TID   levalbuterol  0.63 mg Nebulization TID   menthol-cetylpyridinium  1 lozenge Oral TID   methylPREDNISolone (SOLU-MEDROL) injection  40 mg Intravenous BID   multivitamin with minerals  1 tablet Oral Daily   pantoprazole (PROTONIX) IV  40 mg Intravenous Q24H   sodium bicarbonate  650 mg Oral BID   Continuous Infusions:  piperacillin-tazobactam (ZOSYN)  IV 3.375 g (04/06/22 2024)    LOS: 6 days   Raiford Noble, DO [facility] Available via Epic secure chat 7am-7pm After these hours, please refer to coverage provider listed on [email or web address] 04/06/2022, 9:29 PM

## 2022-04-07 ENCOUNTER — Inpatient Hospital Stay: Payer: Self-pay

## 2022-04-07 LAB — COMPREHENSIVE METABOLIC PANEL
ALT: 88 U/L — ABNORMAL HIGH (ref 0–44)
AST: 57 U/L — ABNORMAL HIGH (ref 15–41)
Albumin: 3.2 g/dL — ABNORMAL LOW (ref 3.5–5.0)
Alkaline Phosphatase: 87 U/L (ref 38–126)
Anion gap: 11 (ref 5–15)
BUN: 25 mg/dL — ABNORMAL HIGH (ref 6–20)
CO2: 25 mmol/L (ref 22–32)
Calcium: 8.8 mg/dL — ABNORMAL LOW (ref 8.9–10.3)
Chloride: 101 mmol/L (ref 98–111)
Creatinine, Ser: 0.67 mg/dL (ref 0.61–1.24)
GFR, Estimated: >60 mL/min (ref >60)
Glucose, Bld: 129 mg/dL — ABNORMAL HIGH (ref 70–99)
Potassium: 4.4 mmol/L (ref 3.5–5.1)
Sodium: 137 mmol/L (ref 135–145)
Total Bilirubin: 1.4 mg/dL — ABNORMAL HIGH (ref 0.3–1.2)
Total Protein: 5.7 g/dL — ABNORMAL LOW (ref 6.5–8.1)

## 2022-04-07 LAB — CBC WITH DIFFERENTIAL/PLATELET
Abs Immature Granulocytes: 0.14 10*3/uL — ABNORMAL HIGH (ref 0.00–0.07)
Basophils Absolute: 0 10*3/uL (ref 0.0–0.1)
Basophils Relative: 1 %
Eosinophils Absolute: 0 10*3/uL (ref 0.0–0.5)
Eosinophils Relative: 0 %
HCT: 32.7 % — ABNORMAL LOW (ref 39.0–52.0)
Hemoglobin: 11.4 g/dL — ABNORMAL LOW (ref 13.0–17.0)
Immature Granulocytes: 7 %
Lymphocytes Relative: 18 %
Lymphs Abs: 0.4 10*3/uL — ABNORMAL LOW (ref 0.7–4.0)
MCH: 35.8 pg — ABNORMAL HIGH (ref 26.0–34.0)
MCHC: 34.9 g/dL (ref 30.0–36.0)
MCV: 102.8 fL — ABNORMAL HIGH (ref 80.0–100.0)
Monocytes Absolute: 0.2 10*3/uL (ref 0.1–1.0)
Monocytes Relative: 10 %
Neutro Abs: 1.4 10*3/uL — ABNORMAL LOW (ref 1.7–7.7)
Neutrophils Relative %: 64 %
Platelets: 96 10*3/uL — ABNORMAL LOW (ref 150–400)
RBC: 3.18 MIL/uL — ABNORMAL LOW (ref 4.22–5.81)
RDW: 14.8 % (ref 11.5–15.5)
WBC: 2.1 10*3/uL — ABNORMAL LOW (ref 4.0–10.5)
nRBC: 3.8 % — ABNORMAL HIGH (ref 0.0–0.2)

## 2022-04-07 LAB — GLUCOSE, CAPILLARY
Glucose-Capillary: 123 mg/dL — ABNORMAL HIGH (ref 70–99)
Glucose-Capillary: 103 mg/dL — ABNORMAL HIGH (ref 70–99)
Glucose-Capillary: 160 mg/dL — ABNORMAL HIGH (ref 70–99)

## 2022-04-07 LAB — MAGNESIUM: Magnesium: 2 mg/dL (ref 1.7–2.4)

## 2022-04-07 LAB — PHOSPHORUS: Phosphorus: 4.3 mg/dL (ref 2.5–4.6)

## 2022-04-07 MED ORDER — AMLODIPINE BESYLATE 5 MG PO TABS
5.0000 mg | ORAL_TABLET | Freq: Every day | ORAL | Status: DC
  Administered 2022-04-07: 5 mg via ORAL
  Filled 2022-04-07: qty 1

## 2022-04-07 MED ORDER — OXYCODONE HCL 5 MG PO TABS
5.0000 mg | ORAL_TABLET | Freq: Four times a day (QID) | ORAL | Status: DC | PRN
  Administered 2022-04-07: 5 mg via ORAL
  Filled 2022-04-07: qty 1

## 2022-04-07 MED ORDER — FOLIC ACID 1 MG PO TABS: 1.0000 mg | ORAL_TABLET | Freq: Every day | ORAL | 0 refills | Status: AC

## 2022-04-07 MED ORDER — POLYETHYLENE GLYCOL 3350 17 G PO PACK: 17.0000 g | PACK | Freq: Every day | ORAL | 0 refills | Status: AC | PRN

## 2022-04-07 MED ORDER — PREDNISONE 5 MG PO TABS: ORAL_TABLET | ORAL | 0 refills | Status: AC

## 2022-04-07 MED ORDER — GUAIFENESIN ER 600 MG PO TB12: 600.0000 mg | ORAL_TABLET | Freq: Two times a day (BID) | ORAL | 0 refills | Status: AC

## 2022-04-07 MED ORDER — ADULT MULTIVITAMIN W/MINERALS CH: 1.0000 | ORAL_TABLET | Freq: Every day | ORAL | 0 refills | Status: AC

## 2022-04-07 MED ORDER — ENSURE ENLIVE PO LIQD: 237.0000 mL | Freq: Two times a day (BID) | ORAL | 12 refills | Status: AC

## 2022-04-07 MED ORDER — AMLODIPINE BESYLATE 5 MG PO TABS: 5.0000 mg | ORAL_TABLET | Freq: Every day | ORAL | 0 refills | Status: AC

## 2022-04-07 MED ORDER — THIAMINE HCL 100 MG PO TABS: 100.0000 mg | ORAL_TABLET | Freq: Every day | ORAL | 0 refills | Status: AC

## 2022-04-07 MED ORDER — ALBUTEROL SULFATE HFA 108 (90 BASE) MCG/ACT IN AERS: 2.0000 | INHALATION_SPRAY | Freq: Four times a day (QID) | RESPIRATORY_TRACT | 2 refills | Status: AC | PRN

## 2022-04-07 MED ORDER — PANTOPRAZOLE SODIUM 40 MG PO TBEC: 40.0000 mg | DELAYED_RELEASE_TABLET | Freq: Every day | ORAL | 2 refills | Status: AC

## 2022-04-07 MED ORDER — CYANOCOBALAMIN 1000 MCG PO TABS: 1000.0000 ug | ORAL_TABLET | Freq: Every day | ORAL | 0 refills | Status: AC

## 2022-04-07 MED ORDER — IBUPROFEN 400 MG PO TABS: 400.0000 mg | ORAL_TABLET | Freq: Four times a day (QID) | ORAL | Status: DC | PRN

## 2022-04-07 MED ORDER — AMOXICILLIN-POT CLAVULANATE 875-125 MG PO TABS: 1.0000 | ORAL_TABLET | Freq: Two times a day (BID) | ORAL | 0 refills | Status: AC

## 2022-04-07 NOTE — Progress Notes (Signed)
Patient was taken for the ultra sound at [facility]

## 2022-04-07 NOTE — Discharge Summary (Incomplete)
Physician Discharge Summary   Patient: Curtis Erickson MRN: [id number] DOB: 1991-02-27  Admit date:     03/31/2022  Discharge date: 04/07/22  Discharge Physician: Merlene Laughter   PCP: Lynnea Ferrier, MD   Recommendations at discharge:  {Tip this will not be part of the note when signed- Example include specific recommendations for outpatient follow-up, pending tests to follow-up on. (Optional):26781}  ***  Discharge Diagnoses: Principal Problem:   Acute respiratory failure with hypoxia (HCC) Active Problems:   Influenza A   Pneumonia of both lungs due to infectious organism   Respiratory failure with hypoxia (HCC)   Acute metabolic encephalopathy  Resolved Problems:   * No resolved hospital problems. [facility] Course: No notes on file  Assessment and Plan: No notes have been filed under this hospital service. Service: Hospitalist     {Tip this will not be part of the note when signed Body mass index is 30.5 kg/m. ,  Nutrition Documentation    Flowsheet Row ED to Hosp-Admission (Discharged) from 03/31/2022 in [facility] 1C MEDICAL TELEMETRY  Nutrition Problem Inadequate oral intake  Etiology inability to eat  Nutrition Goal Patient will meet greater than or equal to 90% of their needs  Interventions Ensure Enlive (each supplement provides 350kcal and 20 grams of protein), MVI     ,  (Optional):26781}  {(NOTE) Pain control PDMP Statment (Optional):26782} Consultants: *** Procedures performed: ***  Disposition: {Plan; Disposition:26390} Diet recommendation:  Discharge Diet Orders (From admission, onward)     Start     Ordered   04/07/22 0000  Diet - low sodium heart healthy        04/07/22 1330           {Diet_Plan:26776} DISCHARGE MEDICATION: Allergies as of 04/07/2022       Reactions   Allopurinol Other (See Comments)   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   amLODipine 5 MG tablet Commonly known as: NORVASC Take 1 tablet (5 mg total) by mouth daily. Start taking on: April 08, 2022   amoxicillin-clavulanate 875-125 MG tablet Commonly known as: AUGMENTIN Take 1 tablet by mouth 2 (two) times daily for 6 days.   colchicine 0.6 MG tablet Take 1 tablet (0.6 mg total) by mouth daily for 5 days.   cyanocobalamin 1000 MCG tablet Take 1 tablet (1,000 mcg total) by mouth daily.   feeding supplement Liqd Take 237 mLs by mouth 2 (two) times daily between meals.   folic acid 1 MG tablet Commonly known as: FOLVITE Take 1 tablet (1 mg total) by mouth daily.   guaiFENesin 600 MG 12 hr tablet Commonly known as: MUCINEX Take 1 tablet (600 mg total) by mouth 2 (two) times daily for 5 days.   multivitamin with minerals Tabs tablet Take 1 tablet by mouth daily.   pantoprazole 40 MG tablet Commonly known as: Protonix Take 1 tablet (40 mg total) by mouth daily.   polyethylene glycol 17 g packet Commonly known as: MIRALAX / GLYCOLAX Take 17 g by mouth daily as needed for moderate constipation.  predniSONE 5 MG tablet Commonly known as: DELTASONE Take 10 tablets (50 mg total) by mouth daily with breakfast for 7 days, THEN 9 tablets (45 mg total) daily with breakfast for 7 days, THEN 8 tablets (40 mg total) daily with breakfast for 7 days, THEN 7 tablets (35 mg total) daily with breakfast for 7 days, THEN 6 tablets (30 mg total) daily with breakfast for 7 days, THEN 5 tablets (25 mg total) daily with breakfast for 7 days, THEN 4 tablets (20 mg total) daily with breakfast for 7 days, THEN 3 tablets (15 mg total) daily with breakfast for 7 days, THEN 2 tablets (10 mg  total) daily with breakfast for 7 days, THEN 1 tablet (5 mg total) daily with breakfast for 7 days. Start taking on: April 08, 2022   thiamine 100 MG tablet Commonly known as: VITAMIN B1 Take 1 tablet (100 mg total) by mouth daily.               Durable Medical Equipment  (From admission, onward)           Start     Ordered   04/07/22 1330  DME Oxygen  Once       Question Answer Comment  Length of Need 6 Months   Mode or (Route) Nasal cannula   Liters per Minute 2   Frequency Continuous (stationary and portable oxygen unit needed)   Oxygen delivery system Gas      04/07/22 1330            Follow-up Information     Lynnea Ferrier, MD Follow up.   Specialty: Internal Medicine Why: Follow up within 1-2 weeks Contact information: [address] [facility] [place] [address] [phone]         Vida Rigger, MD. Call.   Specialty: Pulmonary Disease Why: Follow up with Pulmonary in the outpatient setting Contact information: [address] [place] [address] [phone]         Sung Amabile, DO. Call.   Specialty: Surgery Why: Follow up for outpatient HIDA and Gallbladder further evaluation Contact information: [address] [place] [address] [phone]                Discharge Exam: Ceasar Mons Weights   04/04/22 0258 04/05/22 0206 04/06/22 0345  Weight: 92 kg 83.2 kg 91 kg   ***  Condition at discharge: {DC Condition:26389}  The results of significant diagnostics from this hospitalization (including imaging, microbiology, ancillary and laboratory) are listed below for reference.   Imaging Studies: US Abdomen Limited RUQ (LIVER/GB)  Result Date: 04/07/2022 CLINICAL DATA:  Abnormal LFTs EXAM: ULTRASOUND ABDOMEN LIMITED RIGHT UPPER QUADRANT COMPARISON:  CT scan of the abdomen and pelvis March 31, 2022 FINDINGS: Gallbladder: Gallbladder wall is borderline to mildly thickened measuring  3.4 mm. A small amount of pericholecystic fluid is identified also present on the comparison CT scan. No stones, sludge, or Murphy's sign identified. Common bile duct: Diameter: 3.9 mm Liver: No focal lesion identified. Within normal limits in parenchymal echogenicity. Portal vein is patent on color Doppler imaging with normal direction of blood flow towards the liver. Other: Trace fluid adjacent to the dome of the liver. IMPRESSION: 1. The gallbladder wall is borderline to mildly thickened measuring 3.4 mm. A small amount of pericholecystic fluid is identified also present on the comparison CT scan. No stones, sludge, or Murphy's sign. If there is concern for acute acalculous cholecystitis, a HIDA scan could help  further evaluation. 2. Trace ascites adjacent to the dome of the liver. 3. The liver, common bile duct, and common bile duct are within normal limits. Electronically Signed   By: Gerome Sam III M.D.   On: 04/07/2022 09:19   DG Chest Port 1 View  Result Date: 04/07/2022 CLINICAL DATA:  Shortness of breath. EXAM: PORTABLE CHEST 1 VIEW COMPARISON:  04/06/2022 FINDINGS: Heart is enlarged. There are patchy airspace filling opacity bilaterally, similar in appearance to most recent studies. IMPRESSION: 1. Cardiomegaly. 2. Similar bilateral pulmonary infiltrates. Electronically Signed   By: Norva Pavlov M.D.   On: 04/07/2022 08:34   DG Chest Port 1 View  Result Date: 04/06/2022 CLINICAL DATA:  Shortness of breath EXAM: PORTABLE CHEST 1 VIEW COMPARISON:  Portable exam 0412 hours compared to 04/05/2022 FINDINGS: Borderline enlargement of cardiac silhouette. Stable mediastinal contours. Diffuse BILATERAL airspace infiltrates, slightly increased on LEFT since previous exam. No pleural effusion or pneumothorax. No osseous abnormalities. IMPRESSION: BILATERAL airspace infiltrates slightly increased on LEFT versus previous exam. Electronically Signed   By: Ulyses Southward M.D.   On: 04/06/2022 08:16   DG  Chest Port 1 View  Result Date: 04/05/2022 CLINICAL DATA:  Shortness of breath. EXAM: PORTABLE CHEST 1 VIEW COMPARISON:  One-view chest x-ray 04/01/2022 FINDINGS: The patient has been extubated. Gastric tube was removed. Cardiac silhouette remains enlarged. Overall lung volumes are slightly improved. Right greater than left interstitial and airspace opacities remain but are slightly improved. No significant effusions are present. IMPRESSION: 1. Interval extubation and removal of gastric tube. 2. Slight improved lung volumes. 3. Persistent right greater than left interstitial and airspace opacities with continued slight improvement from previous studies. Electronically Signed   By: Marin Roberts M.D.   On: 04/05/2022 07:43   ECHOCARDIOGRAM COMPLETE  Result Date: 04/02/2022    ECHOCARDIOGRAM REPORT   Patient Name:   Curtis Erickson Date of Exam: 04/01/2022 Medical Rec #:  [id number]      Height:       68.0 in Accession #:    [id number]     Weight:       192.2 lb Date of Birth:  [birth year]      BSA:          2.010 m Patient Age:    31 years       BP:           133/59 mmHg Patient Gender: M              HR:           116 bpm. Exam Location:  [facility] Procedure: 2D Echo, Color Doppler and Cardiac Doppler Indications:     I50.31 congestive heart failure-Acute Diastolic  History:         Patient has prior history of Echocardiogram examinations, most                  recent 03/10/2021. Gout; Risk Factors:Hypertension.  Sonographer:     Humphrey Rolls Referring Phys:  [id number] Hubbard Hartshorn OUMA Diagnosing Phys: Debbe Odea MD  Sonographer Comments: Image acquisition challenging due to respiratory motion. IMPRESSIONS  1. Left ventricular ejection fraction, by estimation, is 60 to 65%. The left ventricle has normal function. The left ventricle has no regional wall motion abnormalities. Left ventricular diastolic parameters were normal.  2. Right ventricular systolic function is normal. The right ventricular size  is normal.  3. The mitral valve is normal in structure. Trivial mitral valve regurgitation.  4. The  aortic valve is normal in structure. Aortic valve regurgitation is not visualized.  5. The inferior vena cava is dilated in size with <50% respiratory variability, suggesting right atrial pressure of 15 mmHg. FINDINGS  Left Ventricle: Left ventricular ejection fraction, by estimation, is 60 to 65%. The left ventricle has normal function. The left ventricle has no regional wall motion abnormalities. The left ventricular internal cavity size was normal in size. There is  no left ventricular hypertrophy. Left ventricular diastolic parameters were normal. Right Ventricle: The right ventricular size is normal. No increase in right ventricular wall thickness. Right ventricular systolic function is normal. Left Atrium: Left atrial size was normal in size. Right Atrium: Right atrial size was normal in size. Pericardium: There is no evidence of pericardial effusion. Mitral Valve: The mitral valve is normal in structure. Trivial mitral valve regurgitation. Tricuspid Valve: The tricuspid valve is normal in structure. Tricuspid valve regurgitation is mild. Aortic Valve: The aortic valve is normal in structure. Aortic valve regurgitation is not visualized. Aortic valve mean gradient measures 9.0 mmHg. Aortic valve peak gradient measures 19.9 mmHg. Aortic valve area, by VTI measures 2.75 cm. Pulmonic Valve: The pulmonic valve was normal in structure. Pulmonic valve regurgitation is mild. Aorta: The aortic root and ascending aorta are structurally normal, with no evidence of dilitation. Venous: The inferior vena cava is dilated in size with less than 50% respiratory variability, suggesting right atrial pressure of 15 mmHg. IAS/Shunts: No atrial level shunt detected by color flow Doppler.  LEFT VENTRICLE PLAX 2D LVIDd:         5.20 cm   Diastology LVIDs:         2.90 cm   LV e' medial:    10.60 cm/s LV PW:         1.20 cm   LV E/e'  medial:  11.6 LV IVS:        0.80 cm   LV e' lateral:   15.00 cm/s LVOT diam:     2.10 cm   LV E/e' lateral: 8.2 LV SV:         91 LV SV Index:   45 LVOT Area:     3.46 cm  RIGHT VENTRICLE RV Basal diam:  3.10 cm RV Mid diam:    3.90 cm RV S prime:     13.70 cm/s LEFT ATRIUM             Index        RIGHT ATRIUM           Index LA diam:        4.40 cm 2.19 cm/m   RA Area:     22.90 cm LA Vol (A2C):   57.4 ml 28.56 ml/m  RA Volume:   65.90 ml  32.79 ml/m LA Vol (A4C):   47.7 ml 23.74 ml/m LA Biplane Vol: 52.8 ml 26.27 ml/m  AORTIC VALVE                     PULMONIC VALVE AV Area (Vmax):    2.52 cm      PV Vmax:       1.29 m/s AV Area (Vmean):   3.12 cm      PV Vmean:      88.400 cm/s AV Area (VTI):     2.75 cm      PV VTI:        0.201 m AV Vmax:  223.00 cm/s   PV Peak grad:  6.7 mmHg AV Vmean:          132.000 cm/s  PV Mean grad:  4.0 mmHg AV VTI:            0.331 m AV Peak Grad:      19.9 mmHg AV Mean Grad:      9.0 mmHg LVOT Vmax:         162.00 cm/s LVOT Vmean:        119.000 cm/s LVOT VTI:          0.263 m LVOT/AV VTI ratio: 0.79  AORTA Ao Root diam: 3.00 cm MITRAL VALVE MV Area (PHT): 3.90 cm     SHUNTS MV Decel Time: 194 msec     Systemic VTI:  0.26 m MV E velocity: 123.33 cm/s  Systemic Diam: 2.10 cm Debbe Odea MD Electronically signed by Debbe Odea MD Signature Date/Time: 04/02/2022/1:49:26 PM    Final    DG Chest Port 1 View  Result Date: 04/01/2022 CLINICAL DATA:  Respiratory failure with hypoxia EXAM: PORTABLE CHEST 1 VIEW COMPARISON:  Radiograph 03/31/2022 FINDINGS: Endotracheal tube overlies the trachea proximally 2.2 cm above the carina. Orogastric tube side port overlies the stomach. Unchanged enlarged cardiac silhouette. There is severe diffuse airspace disease, slightly improved from prior exam. No large effusion or evidence of pneumothorax. No acute osseous abnormality. IMPRESSION: Persistent diffuse airspace disease, slightly improved in comparison to prior  exam. Electronically Signed   By: Caprice Renshaw M.D.   On: 04/01/2022 07:55   DG Chest 1 View  Result Date: 03/31/2022 CLINICAL DATA:  Intubation EXAM: CHEST  1 VIEW COMPARISON:  Earlier today FINDINGS: Endotracheal tube with tip just below the clavicular heads. An enteric tube tip and side-port reaches the stomach. Confluent airspace disease remains symmetric with peripheral sparing. Cardiopericardial enlargement accentuated by technique, no pericardial effusion by prior CT. IMPRESSION: 1. Unremarkable hardware. 2. Unchanged severe airspace disease. Electronically Signed   By: Tiburcio Pea M.D.   On: 03/31/2022 06:23   CT Angio Chest PE W and/or Wo Contrast  Result Date: 03/31/2022 CLINICAL DATA:  Hypoxic respiratory failure, evaluate for PE. Influenza positive. Query sepsis. EXAM: CT ANGIOGRAPHY CHEST CT ABDOMEN AND PELVIS WITH CONTRAST TECHNIQUE: Multidetector CT imaging of the chest was performed using the standard protocol during bolus administration of intravenous contrast. Multiplanar CT image reconstructions and MIPs were obtained to evaluate the vascular anatomy. Multidetector CT imaging of the abdomen and pelvis was performed using the standard protocol during bolus administration of intravenous contrast. RADIATION DOSE REDUCTION: This exam was performed according to the departmental dose-optimization program which includes automated exposure control, adjustment of the mA and/or kV according to patient size and/or use of iterative reconstruction technique. CONTRAST:  OMNIPAQUE IOHEXOL 350 MG/ML SOLN COMPARISON:  Chest radiographs earlier today and CT abdomen and pelvis 03/09/2021. FINDINGS: CTA CHEST FINDINGS Cardiovascular: Satisfactory opacification of the pulmonary arteries to the segmental level. No evidence of pulmonary embolism. Cardiomegaly. Small pericardial effusion. Mediastinum/Nodes: Endotracheal tube in the low intrathoracic trachea approximately 5 mm from the carina. Recommend  retraction approximately 2-3 cm. Enteric tube tip in the stomach. No enlarged mediastinal, hilar, or axillary lymph nodes. Thyroid gland, trachea, and esophagus demonstrate no significant findings. Lungs/Pleura: Ground glass and peribronchovascular predominant consolidative opacities greatest centrally in both lungs. No pleural effusion or pneumothorax. Musculoskeletal: No chest wall abnormality. No acute osseous findings. Review of the MIP images confirms the above findings. CT ABDOMEN and PELVIS FINDINGS  Hepatobiliary: No focal liver abnormality is seen. No gallstones, gallbladder wall thickening, or biliary dilatation. Periportal edema. Pancreas: Unremarkable. No pancreatic ductal dilatation or surrounding inflammatory changes. Spleen: The spleen is enlarged measuring 15.1 cm in craniocaudal dimension. Adrenals/Urinary Tract: Unremarkable adrenal glands. Questionable patchy enhancement of the left kidney though this is partially obscured by artifact. Prominent left renal pelvis with perinephric stranding about the left renal pelvis. No hydroureter or obstructing calculi. Foley catheter in the nondistended bladder. Stomach/Bowel: Enteric tube tip in the stomach. Normal caliber large and small bowel. Appendectomy. Bowel wall thickening. Vascular/Lymphatic: No significant vascular findings are present. No enlarged abdominal or pelvic lymph nodes. Reproductive: Prostate is unremarkable. Other: Trace free fluid in the pelvis.  Mild body wall edema. Musculoskeletal: No acute osseous abnormality. Review of the MIP images confirms the above findings. IMPRESSION: 1. Perihilar predominant bilateral ground-glass and consolidative opacities are nonspecific but can be seen in the setting of multifocal pneumonia, acute respiratory distress syndrome, or pulmonary edema. 2. The ETT is in the low intrathoracic trachea approximately 5 mm from the carina. Recommend retraction approximately 2-3 cm. 3. Question patchy enhancement of  the left kidney versus artifact. There is mild prominence of the left renal pelvis with adjacent stranding. Pyelonephritis is difficult to exclude. 4. Splenomegaly. 5. Small volume free fluid in the pelvis and body wall anasarca. Electronically Signed   By: Minerva Fester M.D.   On: 03/31/2022 03:59   CT ABDOMEN PELVIS W CONTRAST  Result Date: 03/31/2022 CLINICAL DATA:  Hypoxic respiratory failure, evaluate for PE. Influenza positive. Query sepsis. EXAM: CT ANGIOGRAPHY CHEST CT ABDOMEN AND PELVIS WITH CONTRAST TECHNIQUE: Multidetector CT imaging of the chest was performed using the standard protocol during bolus administration of intravenous contrast. Multiplanar CT image reconstructions and MIPs were obtained to evaluate the vascular anatomy. Multidetector CT imaging of the abdomen and pelvis was performed using the standard protocol during bolus administration of intravenous contrast. RADIATION DOSE REDUCTION: This exam was performed according to the departmental dose-optimization program which includes automated exposure control, adjustment of the mA and/or kV according to patient size and/or use of iterative reconstruction technique. CONTRAST:  OMNIPAQUE IOHEXOL 350 MG/ML SOLN COMPARISON:  Chest radiographs earlier today and CT abdomen and pelvis 03/09/2021. FINDINGS: CTA CHEST FINDINGS Cardiovascular: Satisfactory opacification of the pulmonary arteries to the segmental level. No evidence of pulmonary embolism. Cardiomegaly. Small pericardial effusion. Mediastinum/Nodes: Endotracheal tube in the low intrathoracic trachea approximately 5 mm from the carina. Recommend retraction approximately 2-3 cm. Enteric tube tip in the stomach. No enlarged mediastinal, hilar, or axillary lymph nodes. Thyroid gland, trachea, and esophagus demonstrate no significant findings. Lungs/Pleura: Ground glass and peribronchovascular predominant consolidative opacities greatest centrally in both lungs. No pleural effusion  or pneumothorax. Musculoskeletal: No chest wall abnormality. No acute osseous findings. Review of the MIP images confirms the above findings. CT ABDOMEN and PELVIS FINDINGS Hepatobiliary: No focal liver abnormality is seen. No gallstones, gallbladder wall thickening, or biliary dilatation. Periportal edema. Pancreas: Unremarkable. No pancreatic ductal dilatation or surrounding inflammatory changes. Spleen: The spleen is enlarged measuring 15.1 cm in craniocaudal dimension. Adrenals/Urinary Tract: Unremarkable adrenal glands. Questionable patchy enhancement of the left kidney though this is partially obscured by artifact. Prominent left renal pelvis with perinephric stranding about the left renal pelvis. No hydroureter or obstructing calculi. Foley catheter in the nondistended bladder. Stomach/Bowel: Enteric tube tip in the stomach. Normal caliber large and small bowel. Appendectomy. Bowel wall thickening. Vascular/Lymphatic: No significant vascular findings are present. No enlarged abdominal  or pelvic lymph nodes. Reproductive: Prostate is unremarkable. Other: Trace free fluid in the pelvis.  Mild body wall edema. Musculoskeletal: No acute osseous abnormality. Review of the MIP images confirms the above findings. IMPRESSION: 1. Perihilar predominant bilateral ground-glass and consolidative opacities are nonspecific but can be seen in the setting of multifocal pneumonia, acute respiratory distress syndrome, or pulmonary edema. 2. The ETT is in the low intrathoracic trachea approximately 5 mm from the carina. Recommend retraction approximately 2-3 cm. 3. Question patchy enhancement of the left kidney versus artifact. There is mild prominence of the left renal pelvis with adjacent stranding. Pyelonephritis is difficult to exclude. 4. Splenomegaly. 5. Small volume free fluid in the pelvis and body wall anasarca. Electronically Signed   By: Minerva Festeryler  Stutzman M.D.   On: 03/31/2022 03:59   DG Chest Portable 1  View  Result Date: 03/31/2022 CLINICAL DATA:  Post intubation and OG placement EXAM: PORTABLE CHEST 1 VIEW COMPARISON:  Radiographs earlier today at 12:30 a.m. FINDINGS: Endotracheal tube in the intrathoracic trachea 2.7 cm from the carina. OG tube tip in the stomach with side port likely in the distal esophagus. Recommend advancement approximately 4-5 cm. Stable cardiomegaly. Similar to slightly increased hazy bilateral perihilar predominant airspace opacities suspicious for edema. No pleural effusion or pneumothorax. IMPRESSION: 1. ETT in good position. 2. OG tube tip in the stomach with side port likely in the distal esophagus. Recommend advancement approximately 4-5 cm to ensure side port is within the stomach. 3. Similar to increased perihilar airspace opacities suggestive of pulmonary edema though infection is not excluded. Electronically Signed   By: Minerva Festeryler  Stutzman M.D.   On: 03/31/2022 03:04   DG Chest Portable 1 View  Result Date: 03/31/2022 CLINICAL DATA:  Dyspnea EXAM: PORTABLE CHEST 1 VIEW COMPARISON:  03/10/2021 FINDINGS: The lungs are symmetrically expanded. Perihilar airspace infiltrate is present, new since prior examination, in keeping with perihilar alveolar pulmonary edema or multifocal infection. No pneumothorax or pleural effusion. Cardiac size is within normal limits. Previously noted left internal jugular hemodialysis catheter has been removed. No acute bone abnormality. IMPRESSION: 1. Interval development of perihilar airspace infiltrate, in keeping with perihilar alveolar pulmonary edema or multifocal infection. Electronically Signed   By: Helyn NumbersAshesh  Parikh M.D.   On: 03/31/2022 00:48    Microbiology: Results for orders placed or performed during the hospital encounter of 03/31/22  Blood Culture (routine x 2)     Status: None   Collection Time: 03/31/22 12:34 AM   Specimen: BLOOD  Result Value Ref Range Status   Specimen Description BLOOD RIGHT ANTECUBITAL  Final   Special  Requests   Final    BOTTLES DRAWN AEROBIC AND ANAEROBIC Blood Culture results may not be optimal due to an excessive volume of blood received in culture bottles   Culture   Final    NO GROWTH 5 DAYS Performed at [facility], [address] [place], [place] [address]    Report Status 04/05/2022 FINAL  Final  Resp panel by RT-PCR (RSV, Flu A&B, Covid) Anterior Nasal Swab     Status: Abnormal   Collection Time: 03/31/22 12:34 AM   Specimen: Anterior Nasal Swab  Result Value Ref Range Status   SARS Coronavirus 2 by RT PCR NEGATIVE NEGATIVE Final    Comment: (NOTE) SARS-CoV-2 target nucleic acids are NOT DETECTED.  The SARS-CoV-2 RNA is generally detectable in upper respiratory specimens during the acute phase of infection. The lowest concentration of SARS-CoV-2 viral copies this assay can detect  is 138 copies/mL. A negative result does not preclude SARS-Cov-2 infection and should not be used as the sole basis for treatment or other patient management decisions. A negative result may occur with  improper specimen collection/handling, submission of specimen other than nasopharyngeal swab, presence of viral mutation(s) within the areas targeted by this assay, and inadequate number of viral copies(<138 copies/mL). A negative result must be combined with clinical observations, patient history, and epidemiological information. The expected result is Negative.  Fact Sheet for Patients:  BloggerCourse.com  Fact Sheet for Healthcare Providers:  SeriousBroker.it  This test is no t yet approved or cleared by the [place] FDA and  has been authorized for detection and/or diagnosis of SARS-CoV-2 by FDA under an Emergency Use Authorization (EUA). This EUA will remain  in effect (meaning this test can be used) for the duration of the COVID-19 declaration under Section 564(b)(1) of the Act, 21 U.S.C.section 360bbb-3(b)(1), unless  the authorization is terminated  or revoked sooner.       Influenza A by PCR POSITIVE (A) NEGATIVE Final   Influenza B by PCR NEGATIVE NEGATIVE Final    Comment: (NOTE) The Xpert Xpress SARS-CoV-2/FLU/RSV plus assay is intended as an aid in the diagnosis of influenza from Nasopharyngeal swab specimens and should not be used as a sole basis for treatment. Nasal washings and aspirates are unacceptable for Xpert Xpress SARS-CoV-2/FLU/RSV testing.  Fact Sheet for Patients: BloggerCourse.com  Fact Sheet for Healthcare Providers: SeriousBroker.it  This test is not yet approved or cleared by the [place] FDA and has been authorized for detection and/or diagnosis of SARS-CoV-2 by FDA under an Emergency Use Authorization (EUA). This EUA will remain in effect (meaning this test can be used) for the duration of the COVID-19 declaration under Section 564(b)(1) of the Act, 21 U.S.C. section 360bbb-3(b)(1), unless the authorization is terminated or revoked.     Resp Syncytial Virus by PCR NEGATIVE NEGATIVE Final    Comment: (NOTE) Fact Sheet for Patients: BloggerCourse.com  Fact Sheet for Healthcare Providers: SeriousBroker.it  This test is not yet approved or cleared by the [place] FDA and has been authorized for detection and/or diagnosis of SARS-CoV-2 by FDA under an Emergency Use Authorization (EUA). This EUA will remain in effect (meaning this test can be used) for the duration of the COVID-19 declaration under Section 564(b)(1) of the Act, 21 U.S.C. section 360bbb-3(b)(1), unless the authorization is terminated or revoked.  Performed at [facility], [address] [place], [place] [address]   Blood Culture (routine x 2)     Status: None   Collection Time: 03/31/22 12:55 AM   Specimen: BLOOD  Result Value Ref Range Status   Specimen Description BLOOD LEFT  ANTECUBITAL  Final   Special Requests   Final    BOTTLES DRAWN AEROBIC AND ANAEROBIC Blood Culture results may not be optimal due to an excessive volume of blood received in culture bottles   Culture   Final    NO GROWTH 5 DAYS Performed at [facility], [address] [place], [place] [address]    Report Status 04/05/2022 FINAL  Final  Urine Culture     Status: None   Collection Time: 03/31/22  2:59 AM   Specimen: Urine, Clean Catch  Result Value Ref Range Status   Specimen Description   Final    URINE, CLEAN CATCH Performed at [facility], [address], [place], [place] [address]    Special Requests   Final  NONE Performed at [facility], [address], [place], [place] [address]    Culture   Final    NO GROWTH Performed at [facility], [address], [place], [place] [address]    Report Status 04/01/2022 FINAL  Final  Respiratory (~20 pathogens) panel by PCR     Status: Abnormal   Collection Time: 03/31/22  2:59 AM   Specimen: Urine, Clean Catch; Respiratory  Result Value Ref Range Status   Adenovirus NOT DETECTED NOT DETECTED Final   Coronavirus 229E NOT DETECTED NOT DETECTED Final    Comment: (NOTE) The Coronavirus on the Respiratory Panel, DOES NOT test for the novel  Coronavirus (2019 nCoV)    Coronavirus HKU1 NOT DETECTED NOT DETECTED Final   Coronavirus NL63 NOT DETECTED NOT DETECTED Final   Coronavirus OC43 NOT DETECTED NOT DETECTED Final   Metapneumovirus NOT DETECTED NOT DETECTED Final   Rhinovirus / Enterovirus NOT DETECTED NOT DETECTED Final   Influenza A H1 2009 DETECTED (A) NOT DETECTED Final   Influenza B NOT DETECTED NOT DETECTED Final   Parainfluenza Virus 1 NOT DETECTED NOT DETECTED Final   Parainfluenza Virus 2 NOT DETECTED NOT DETECTED Final   Parainfluenza Virus 3 NOT DETECTED NOT DETECTED Final   Parainfluenza Virus 4 NOT DETECTED NOT DETECTED Final   Respiratory Syncytial Virus NOT DETECTED NOT  DETECTED Final   Bordetella pertussis NOT DETECTED NOT DETECTED Final   Bordetella Parapertussis NOT DETECTED NOT DETECTED Final   Chlamydophila pneumoniae NOT DETECTED NOT DETECTED Final   Mycoplasma pneumoniae NOT DETECTED NOT DETECTED Final    Comment: Performed at [facility], [address] [place], [place] [address]  MRSA Next Gen by PCR, Nasal     Status: None   Collection Time: 03/31/22  4:45 AM   Specimen: Nasal Mucosa; Nasal Swab  Result Value Ref Range Status   MRSA by PCR Next Gen NOT DETECTED NOT DETECTED Final    Comment: (NOTE) The GeneXpert MRSA Assay (FDA approved for NASAL specimens only), is one component of a comprehensive MRSA colonization surveillance program. It is not intended to diagnose MRSA infection nor to guide or monitor treatment for MRSA infections. Test performance is not FDA approved in patients less than 13 years old. Performed at [facility], [address] [place], [place] [address]   Culture, Respiratory w Gram Stain     Status: None   Collection Time: 04/01/22 11:23 AM   Specimen: Tracheal Aspirate; Respiratory  Result Value Ref Range Status   Specimen Description   Final    TRACHEAL ASPIRATE Performed at [facility], [address] [place], [place] [address]    Special Requests   Final    NONE Performed at [facility], [address] [place], [place] [address]    Gram Stain   Final    MODERATE WBC PRESENT, PREDOMINANTLY PMN NO ORGANISMS SEEN    Culture   Final    NO GROWTH 2 DAYS Performed at [facility], [address] [place], [place] [address]    Report Status 04/03/2022 FINAL  Final    Labs: CBC: Recent Labs  Lab 04/02/22 0424 04/03/22 0621 04/04/22 0704 04/05/22 0317 04/06/22 0319 04/07/22 0518  WBC 2.7* 2.2* 2.0* 1.4* 1.7* 2.1*  NEUTROABS 2.1  --  1.4* 0.8* 1.1* 1.4*  HGB 11.7* 11.3* 12.7* 11.9* 11.3* 11.4*  HCT 34.7* 34.1* 37.4* 35.1* 33.0* 32.7*  MCV 109.1*  106.9* 104.8* 104.2* 103.8*  102.8*  PLT 118* 117* 129* 110* 113* 96*   Basic Metabolic Panel: Recent Labs  Lab 04/02/22 0424 04/03/22 0621 04/04/22 0704 04/05/22 0317 04/06/22 0319 04/07/22 0518  NA 140 139 138 137 138 137  K 4.9 5.1 5.1 4.9 4.4 4.4  CL 104 105 105 104 106 101  CO2 20* 22 21* 22 19* 25  GLUCOSE 176* 156* 177* 160* 153* 129*  BUN 42* 41* 42* 37* 28* 25*  CREATININE 0.82 0.73 0.81 0.67 0.66 0.67  CALCIUM 9.9 9.6 9.6 9.2 9.0 8.8*  MG 2.5* 2.2 2.2 2.1 2.0 2.0  PHOS 2.6 2.9  --  4.2 3.9 4.3   Liver Function Tests: Recent Labs  Lab 04/01/22 0440 04/05/22 0317 04/06/22 0319 04/07/22 0518  AST 35 50* 68* 57*  ALT 18 64* 88* 88*  ALKPHOS 50 81 101 87  BILITOT 1.5* 1.2 1.1 1.4*  PROT 6.7 6.3* 6.1* 5.7*  ALBUMIN 3.4* 3.3* 3.5 3.2*   CBG: Recent Labs  Lab 04/06/22 1557 04/06/22 2200 04/07/22 0739 04/07/22 1139 04/07/22 1600  GLUCAP 154* 152* 123* 103* 160*    Discharge time spent: {LESS THAN/GREATER THAN:26388} 30 minutes.  Signed: Merlene Laughter, DO [facility] 04/07/2022

## 2022-04-07 NOTE — TOC Progression Note (Signed)
Transition of Care [facility]) - Progression Note    Patient Details  Name: Curtis Erickson MRN: [id number] Date of Birth: 1990-06-29  Transition of [facility] CM/SW Contact  Susa Simmonds, [place] Phone Number: 04/07/2022, 1:47 PM  Clinical Narrative: CSW contacted [employer] and ordered [facility] Oxygen for patient. Patient is also aware he will need a card on file and oxygen will only be for 30 days. CSW also let patient know it will be $200-250 a month after charity. Patient is in agreement. Oxygen will be delivered to the room.         Expected Discharge Plan and Services         Expected Discharge Date: 04/07/22                                     Social Determinants of Health (SDOH) Interventions SDOH Screenings   Tobacco Use: Low Risk  (03/31/2022)    Readmission Risk Interventions     No data to display

## 2022-04-07 NOTE — Progress Notes (Signed)
Patient is back on the floor after Ultra sound scan

## 2022-04-07 NOTE — Progress Notes (Signed)
Oxygen Qualifications:  O2 Saturations at Rest on RA: 85% O2 Saturations at Rest on 2L via [place]: 94%  O2 Saturations on 2L while ambulating: 91%  Patient is recovering from Pneumonia and needs oxygen to maintain adequate saturations

## 2022-04-07 NOTE — Progress Notes (Signed)
Patient awaiting oxygen delivery for discharge.

## 2022-04-10 NOTE — Discharge Summary (Signed)
Physician Discharge Summary   Patient: Curtis Erickson MRN: [id number] DOB: Aug 08, 1990  Admit date:     03/31/2022  Discharge date: 04/07/2022  Discharge Physician: Marguerita Merles, DO   PCP: Lynnea Ferrier, MD   Recommendations at discharge:   Follow-up with PCP within 1 to 2 weeks and repeat CBC, CMP, mag, Phos within 1 week  Follow-up with pulmonary in outpatient setting within 1 to 2 weeks Follow-up with general surgery in outpatient setting for outpatient evaluation for gallbladder wall thickening as well as some mild.  Cholecystic fluid for possible acute acalculous cholecystitis as well as follow-up with outpatient HIDA scan Repeat chest x-ray in 3 to 6 weeks  Discharge Diagnoses: Principal Problem:   Acute respiratory failure with hypoxia (HCC) Active Problems:   Influenza A   Pneumonia of both lungs due to infectious organism   Respiratory failure with hypoxia (HCC)   Acute metabolic encephalopathy  Resolved Problems:   * No resolved hospital problems. 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   Per ED reports, EMS was called due to respiratory distress.  Patient's girlfriend reported that patient been complaining of bilateral lower extremity edema, flu-like symptoms with worsening shortness of breath.  On EMS arrival patient was found with RR40-50's, sats 90% on room air.  He was treated with 125 mg of Solu-Medrol, LR 500 mL and DuoNeb x 2.   ED Course: Initial vital signs showed HR of 150 beats/minute, BP 168/75 mm Hg, the RR 28 breaths/minute, and the oxygen saturation 92 % on 3L and a temperature of 103.21F (39.5C).    Patient's condition  deteriorated noted to be in acute hypoxic and hypercapnic respiratory failure secondary to influenza A infection and possible superimposed bacterial infection, subsequently intubated and admitted to the critical care service.  Patient improved clinically, extubated, O2 sats improved and transferred to the hospitalist service.   **The patient's lactic acid started trending back up again so able given IV fluid hydration.  He continues to desaturate as well on room air at rest and use at least 3 L of supplemental oxygen will need continued treatment and will give another dose of IV Solu-Medrol today.  May need to go home on oxygen short-term.  PT OT recommending outpatient PT   Given that he continues to have pneumonia on chest x-ray and continues to be symptomatic we will change antibiotics to IV Zosyn.  Lactic acid level is trending down and will evaluate his liver function further.  Lactic acid level is trending down and right upper quadrant ultrasound showed and slightly thickened gallbladder and a small amount of pericholecystic fluid with possible concern for acute acalculous cholecystitis.  I spoke with Dr. Tonna Boehringer who will follow patient up in clinic and order an outpatient HIDA scan.  His respiratory symptoms improved however he not able to be weaned from supplemental oxygen will be discharged on 2 L supplemental oxygen while ambulating.  Will follow-up with PCP, pulmonary as well as general surgery in outpatient setting.  He is medically stable to be discharged at this time  Assessment and Plan:  Acute respiratory failure with hypoxia secondary to influenza A pneumonia, multifocal pneumonia and possible EVALI -Patient noted to have presented and  admitted with acute hypoxic and hypercapnic respiratory failure secondary to influenza A infection and possible bacterial infection. -Patient admitted, intubated under the critical care service. -CT angiogram chest done with perihilar predominant bilateral  groundglass and consolidative opacities nonspecific but can be seen in the setting of multifocal pneumonia, ARDS or pulmonary edema. -SpO2: 91 % O2 Flow Rate (L/min): 3 L/min FiO2 (%): 35 % -Chest x-ray done with interval development of perihilar airspace infiltrate in keeping with perihilar alveolar pulmonary edema multifocal infection. -2D echo done with a EF of 60 to 65%,NWMA. -Patient placed on IV Solu-Medrol for treatment of EVALI; was getting IV 40 mg twice daily and switch to p.o. prednisone prolonged taper -ANA/ANCA/anti-GBM ordered per critical care service and Negative . -Continue current steroids taper as recommended by PCCM. -Continue Mucinex, scheduled nebulizers, Tamiflu, PPI, empiric IV azithromycin and IV Rocephin now changed to IV Zosyn and will change to Augmentin for discharge -Continuous pulse oximetry maintain O2 saturation greater than 90% -Continue supplemental oxygen via nasal cannula and wean O2 as tolerated -C/w Supportive care. -C/w Hycodan and Guaifenesin 1200 mg po BID -Will C/w Xopenex 0.63 mg Neb TID -Repeat chest x-ray today done and showed "BILATERAL airspace infiltrates slightly increased on LEFT versus previous exam."  -Will get PT/OT to evaluate and treat and will need an ambulatory home O2 screen; OT recommending outpatient physical therapy and he did desaturate yesterday and did again prior to D/C so will need 2 liters of O2  -Patient to follow-up with pulmonary soon   Influenza A -Continue Tamiflu, bronchodilators, antitussives, steroids.   -Place on Cepacol lozenges. -Having some mild Hemoptysis but improved    Acute kidney injury Metabolic Acidosis, mild -Patient noted to have presented with mild acute kidney injury per pulmonary. -Patient noted to have had significant proteinuria on presentation and urinalysis, concern for nephrotic syndrome versus vasculitic process versus diabetes induced nephropathy. Recent Labs  Lab 04/01/22 1704  04/02/22 0424 04/03/22 0621 04/04/22 0704 04/05/22 0317 04/06/22 0319 04/07/22 0518  BUN 36* 42* 41* 42* 37* 28* 25*  CREATININE 0.86 0.82 0.73 0.81 0.67 0.66 0.67  -Patient has a metabolic acidosis with a CO2 of 19, anion gap of 13, chloride level of 106 -ANA/ANCA/anti-GBM and the autoimmune studies were negative. -Nephrology consulted with the following. -Continue empiric steroids. -Further care Per nephrology they have signed off the case   Lactic Acidosis -Unclear etiology and is trending up so we will given IV fluid boluses and given 1.5 L boluses today.   -Current Lactic Acidosis Trend 3.4 -> 4.6 -> 5.1 -> 4.8 -> 4.5 -> 3.8 -> 2.7 -Continue to Monitor and Trend -Repeat CMP in the AM    Acute metabolic encephalopathy, improved  -Likely secondary to problem #1 and #2. -Improved with treatment of problem #1. -Resolved.   History of liver disease/concern for underlying cirrhosis Hyperbilirubinemia -Patient with thrombocytopenia, liver contour smooth on imaging. -Patient with no overt bleeding. -Check RUQ U/S given Abnormal LFTs and showed"The gallbladder wall is borderline to mildly thickened measuring 3.4 mm. A small amount of pericholecystic fluid is identified also present on the comparison CT scan. No stones, sludge, or Murphy's sign. If there is concern for acute acalculous cholecystitis, a HIDA scan could help further evaluation. Trace ascites adjacent to the dome of the liver. The liver, common bile duct, and common bile duct are within normal limits." Recent Labs  Lab 03/31/22 0034 03/31/22 0941 04/01/22 0440 04/05/22 0317 04/06/22 0319 04/07/22 0518  AST 40 35 35 50*  68* 57*  ALT 19 19 18  64* 88* 88*  -Bilirubin Level Trended from 1.2 -> 1.1 -> 1.4 -Continue to Monitor and Trend and repeat LFTs within 1 week  -Patient to follow-up with general surgeon outpatient setting for outpatient HIDA scan if necessary can follow-up with gastroenterology within 1 to 2  weeks   Pancytopenia -Likely secondary to underlying cirrhosis. -Has a Macrocytosis  -Patient with no overt bleeding. Recent Labs  Lab 04/01/22 0446 04/02/22 0424 04/03/22 0621 04/04/22 0704 04/05/22 0317 04/06/22 0319 04/07/22 0518  WBC 4.3 2.7* 2.2* 2.0* 1.4* 1.7* 2.1*  HGB 11.1* 11.7* 11.3* 12.7* 11.9* 11.3* 11.4*  HCT 34.4* 34.7* 34.1* 37.4* 35.1* 33.0* 32.7*  PLT 107* 118* 117* 129* 110* 113* 96*  -Continue IV antibiotics and escalated to IV Zosyn but have now changed to Augmentin for discharge -Repeat labs in the AM.   Obesity -Complicates overall prognosis and care -Estimated body mass index is 30.5 kg/m as calculated from the following:   Height as of this encounter: 5\' 8"  (1.727 m).   Weight as of this encounter: 91 kg.  -Weight Loss and Dietary Counseling given   Nutrition Documentation    [place] ED to Hosp-Admission (Discharged) from 03/31/2022 in [facility]  Nutrition Problem Inadequate oral intake  Etiology inability to eat  Nutrition Goal Patient will meet greater than or equal to 90% of their needs  Interventions Ensure Enlive (each supplement provides 350kcal and 20 grams of protein), MVI      Consultants: PCCM transfer, nephrology Procedures performed: As delineated as above Disposition: Home Diet recommendation:  Discharge Diet Orders (From admission, onward)     Start     Ordered   04/07/22 0000  Diet - low sodium heart healthy        04/07/22 1330           Regular diet DISCHARGE MEDICATION: Allergies as of 04/07/2022       Reactions   Allopurinol Other (See Comments)   Drug induced liver injury resulting in Liver/Kidney failure        Medication List     STOP taking these medications    ferrous sulfate 325 (65 FE) MG tablet       TAKE these medications    albuterol 108 (90 Base) MCG/ACT inhaler Commonly known as: VENTOLIN HFA Inhale 2 puffs into the lungs every 6  (six) hours as needed for wheezing or shortness of breath.   amLODipine 5 MG tablet Commonly known as: NORVASC Take 1 tablet (5 mg total) by mouth daily.   amoxicillin-clavulanate 875-125 MG tablet Commonly known as: AUGMENTIN Take 1 tablet by mouth 2 (two) times daily for 6 days.   colchicine 0.6 MG tablet Take 1 tablet (0.6 mg total) by mouth daily for 5 days.   cyanocobalamin 1000 MCG tablet Take 1 tablet (1,000 mcg total) by mouth daily.   feeding supplement Liqd Take 237 mLs by mouth 2 (two) times daily between meals.   folic acid 1 MG tablet Commonly known as: FOLVITE Take 1 tablet (1 mg total) by mouth daily.   guaiFENesin 600 MG 12 hr tablet Commonly known as: MUCINEX Take 1 tablet (600 mg total) by mouth 2 (two) times daily for 5 days.   multivitamin with minerals Tabs tablet Take 1 tablet by mouth daily.   pantoprazole 40 MG tablet Commonly known as: Protonix Take 1 tablet (40 mg total) by mouth daily.   polyethylene glycol 17 g  packet Commonly known as: MIRALAX / GLYCOLAX Take 17 g by mouth daily as needed for moderate constipation.   predniSONE 5 MG tablet Commonly known as: DELTASONE Take 10 tablets (50 mg total) by mouth daily with breakfast for 7 days, THEN 9 tablets (45 mg total) daily with breakfast for 7 days, THEN 8 tablets (40 mg total) daily with breakfast for 7 days, THEN 7 tablets (35 mg total) daily with breakfast for 7 days, THEN 6 tablets (30 mg total) daily with breakfast for 7 days, THEN 5 tablets (25 mg total) daily with breakfast for 7 days, THEN 4 tablets (20 mg total) daily with breakfast for 7 days, THEN 3 tablets (15 mg total) daily with breakfast for 7 days, THEN 2 tablets (10 mg total) daily with breakfast for 7 days, THEN 1 tablet (5 mg total) daily with breakfast for 7 days. Start taking on: April 08, 2022   thiamine 100 MG tablet Commonly known as: VITAMIN B1 Take 1 tablet (100 mg total) by mouth daily.        Follow-up  Information     Adin Hector, MD Follow up.   Specialty: Internal Medicine Why: Follow up within 1-2 weeks Contact information: [place] [address] [phone]         Ottie Glazier, MD. Call.   Specialty: Pulmonary Disease Why: Follow up with Pulmonary in the outpatient setting Contact information: [place] [address] [phone]         Benjamine Sprague, DO. Call.   Specialty: Surgery Why: Follow up for outpatient HIDA and Gallbladder further evaluation Contact information: [place] [address] [phone]                Discharge Exam: Filed Weights   04/04/22 0258 04/05/22 0206 04/06/22 0345  Weight: 92 kg 83.2 kg 91 kg   Vitals:   04/07/22 1558 04/07/22 1602  BP: (!) 175/111 (!) 175/111  Pulse: 95 93  Resp: 18 18  Temp: 98.3 F (36.8 C) 98.3 F (36.8 C)  SpO2: 91% 91%   Examination: Physical Exam:  Constitutional: WN/WD obese Caucasian currently no acute distress Respiratory: Diminished to auscultation bilaterally with some coarse breath sounds and some slight rhonchi.,  No appreciable rales or crackles.. Normal respiratory effort and patient is not tachypenic. No accessory muscle use.  Not wearing supplemental oxygen via nasal cannula at rest but needs it ambulating Cardiovascular: RRR, no murmurs / rubs / gallops. S1 and S2 auscultated. No extremity edema.  Abdomen: Soft, non-tender, slightly distended secondary by habitus. Bowel sounds positive.  GU: Deferred. Musculoskeletal: No clubbing / cyanosis of digits/nails. No joint deformity upper and lower extremities.  Skin: No rashes, lesions, ulcers limited skin evaluation. No induration; Warm and dry.  Neurologic: CN 2-12 grossly intact with no focal deficits. Romberg sign and cerebellar reflexes not assessed.  Psychiatric: Normal judgment and insight. Alert and oriented x 3. Normal mood and appropriate  affect.   Condition at discharge: stable  The results of significant diagnostics from this hospitalization (including imaging, microbiology, ancillary and laboratory) are listed below for reference.   Imaging Studies: US Abdomen Limited RUQ (LIVER/GB)  Result Date: 04/07/2022 CLINICAL DATA:  Abnormal LFTs EXAM: ULTRASOUND ABDOMEN LIMITED RIGHT UPPER QUADRANT COMPARISON:  CT scan of the abdomen and pelvis March 31, 2022 FINDINGS: Gallbladder: Gallbladder wall is borderline to mildly thickened measuring 3.4 mm. A small amount of pericholecystic fluid is identified also  present on the comparison CT scan. No stones, sludge, or Murphy's sign identified. Common bile duct: Diameter: 3.9 mm Liver: No focal lesion identified. Within normal limits in parenchymal echogenicity. Portal vein is patent on color Doppler imaging with normal direction of blood flow towards the liver. Other: Trace fluid adjacent to the dome of the liver. IMPRESSION: 1. The gallbladder wall is borderline to mildly thickened measuring 3.4 mm. A small amount of pericholecystic fluid is identified also present on the comparison CT scan. No stones, sludge, or Murphy's sign. If there is concern for acute acalculous cholecystitis, a HIDA scan could help further evaluation. 2. Trace ascites adjacent to the dome of the liver. 3. The liver, common bile duct, and common bile duct are within normal limits. Electronically Signed   By: Dorise Bullion III M.D.   On: 04/07/2022 09:19   DG Chest Port 1 View  Result Date: 04/07/2022 CLINICAL DATA:  Shortness of breath. EXAM: PORTABLE CHEST 1 VIEW COMPARISON:  04/06/2022 FINDINGS: Heart is enlarged. There are patchy airspace filling opacity bilaterally, similar in appearance to most recent studies. IMPRESSION: 1. Cardiomegaly. 2. Similar bilateral pulmonary infiltrates. Electronically Signed   By: Nolon Nations M.D.   On: 04/07/2022 08:34   DG Chest Port 1 View  Result Date:  04/06/2022 CLINICAL DATA:  Shortness of breath EXAM: PORTABLE CHEST 1 VIEW COMPARISON:  Portable exam 0412 hours compared to 04/05/2022 FINDINGS: Borderline enlargement of cardiac silhouette. Stable mediastinal contours. Diffuse BILATERAL airspace infiltrates, slightly increased on LEFT since previous exam. No pleural effusion or pneumothorax. No osseous abnormalities. IMPRESSION: BILATERAL airspace infiltrates slightly increased on LEFT versus previous exam. Electronically Signed   By: Lavonia Dana M.D.   On: 04/06/2022 08:16   DG Chest Port 1 View  Result Date: 04/05/2022 CLINICAL DATA:  Shortness of breath. EXAM: PORTABLE CHEST 1 VIEW COMPARISON:  One-view chest x-ray 04/01/2022 FINDINGS: The patient has been extubated. Gastric tube was removed. Cardiac silhouette remains enlarged. Overall lung volumes are slightly improved. Right greater than left interstitial and airspace opacities remain but are slightly improved. No significant effusions are present. IMPRESSION: 1. Interval extubation and removal of gastric tube. 2. Slight improved lung volumes. 3. Persistent right greater than left interstitial and airspace opacities with continued slight improvement from previous studies. Electronically Signed   By: San Morelle M.D.   On: 04/05/2022 07:43   ECHOCARDIOGRAM COMPLETE  Result Date: 04/02/2022    ECHOCARDIOGRAM REPORT   Patient Name:   QUANTAY ZAREMBA Hill Date of Exam: 04/01/2022 Medical Rec #:  [id number]      Height:       68.0 in Accession #:    [id number]     Weight:       192.2 lb Date of Birth:  05/12/1990      BSA:          2.010 m Patient Age:    31 years       BP:           133/59 mmHg Patient Gender: M              HR:           116 bpm. Exam Location:  [facility] Procedure: 2D Echo, Color Doppler and Cardiac Doppler Indications:     I50.31 congestive heart failure-Acute Diastolic  History:         Patient has prior history of Echocardiogram examinations, most  recent 03/10/2021.  Gout; Risk Factors:Hypertension.  Sonographer:     Humphrey Rolls Referring Phys:  [id number] Hubbard Hartshorn OUMA Diagnosing Phys: Debbe Odea MD  Sonographer Comments: Image acquisition challenging due to respiratory motion. IMPRESSIONS  1. Left ventricular ejection fraction, by estimation, is 60 to 65%. The left ventricle has normal function. The left ventricle has no regional wall motion abnormalities. Left ventricular diastolic parameters were normal.  2. Right ventricular systolic function is normal. The right ventricular size is normal.  3. The mitral valve is normal in structure. Trivial mitral valve regurgitation.  4. The aortic valve is normal in structure. Aortic valve regurgitation is not visualized.  5. The inferior vena cava is dilated in size with <50% respiratory variability, suggesting right atrial pressure of 15 mmHg. FINDINGS  Left Ventricle: Left ventricular ejection fraction, by estimation, is 60 to 65%. The left ventricle has normal function. The left ventricle has no regional wall motion abnormalities. The left ventricular internal cavity size was normal in size. There is  no left ventricular hypertrophy. Left ventricular diastolic parameters were normal. Right Ventricle: The right ventricular size is normal. No increase in right ventricular wall thickness. Right ventricular systolic function is normal. Left Atrium: Left atrial size was normal in size. Right Atrium: Right atrial size was normal in size. Pericardium: There is no evidence of pericardial effusion. Mitral Valve: The mitral valve is normal in structure. Trivial mitral valve regurgitation. Tricuspid Valve: The tricuspid valve is normal in structure. Tricuspid valve regurgitation is mild. Aortic Valve: The aortic valve is normal in structure. Aortic valve regurgitation is not visualized. Aortic valve mean gradient measures 9.0 mmHg. Aortic valve peak gradient measures 19.9 mmHg. Aortic valve area, by VTI measures 2.75 cm. Pulmonic  Valve: The pulmonic valve was normal in structure. Pulmonic valve regurgitation is mild. Aorta: The aortic root and ascending aorta are structurally normal, with no evidence of dilitation. Venous: The inferior vena cava is dilated in size with less than 50% respiratory variability, suggesting right atrial pressure of 15 mmHg. IAS/Shunts: No atrial level shunt detected by color flow Doppler.  LEFT VENTRICLE PLAX 2D LVIDd:         5.20 cm   Diastology LVIDs:         2.90 cm   LV e' medial:    10.60 cm/s LV PW:         1.20 cm   LV E/e' medial:  11.6 LV IVS:        0.80 cm   LV e' lateral:   15.00 cm/s LVOT diam:     2.10 cm   LV E/e' lateral: 8.2 LV SV:         91 LV SV Index:   45 LVOT Area:     3.46 cm  RIGHT VENTRICLE RV Basal diam:  3.10 cm RV Mid diam:    3.90 cm RV S prime:     13.70 cm/s LEFT ATRIUM             Index        RIGHT ATRIUM           Index LA diam:        4.40 cm 2.19 cm/m   RA Area:     22.90 cm LA Vol (A2C):   57.4 ml 28.56 ml/m  RA Volume:   65.90 ml  32.79 ml/m LA Vol (A4C):   47.7 ml 23.74 ml/m LA Biplane Vol: 52.8 ml 26.27 ml/m  AORTIC VALVE  PULMONIC VALVE AV Area (Vmax):    2.52 cm      PV Vmax:       1.29 m/s AV Area (Vmean):   3.12 cm      PV Vmean:      88.400 cm/s AV Area (VTI):     2.75 cm      PV VTI:        0.201 m AV Vmax:           223.00 cm/s   PV Peak grad:  6.7 mmHg AV Vmean:          132.000 cm/s  PV Mean grad:  4.0 mmHg AV VTI:            0.331 m AV Peak Grad:      19.9 mmHg AV Mean Grad:      9.0 mmHg LVOT Vmax:         162.00 cm/s LVOT Vmean:        119.000 cm/s LVOT VTI:          0.263 m LVOT/AV VTI ratio: 0.79  AORTA Ao Root diam: 3.00 cm MITRAL VALVE MV Area (PHT): 3.90 cm     SHUNTS MV Decel Time: 194 msec     Systemic VTI:  0.26 m MV E velocity: 123.33 cm/s  Systemic Diam: 2.10 cm Debbe Odea MD Electronically signed by Debbe Odea MD Signature Date/Time: 04/02/2022/1:49:26 PM    Final    DG Chest Port 1 View  Result Date:  04/01/2022 CLINICAL DATA:  Respiratory failure with hypoxia EXAM: PORTABLE CHEST 1 VIEW COMPARISON:  Radiograph 03/31/2022 FINDINGS: Endotracheal tube overlies the trachea proximally 2.2 cm above the carina. Orogastric tube side port overlies the stomach. Unchanged enlarged cardiac silhouette. There is severe diffuse airspace disease, slightly improved from prior exam. No large effusion or evidence of pneumothorax. No acute osseous abnormality. IMPRESSION: Persistent diffuse airspace disease, slightly improved in comparison to prior exam. Electronically Signed   By: Caprice Renshaw M.D.   On: 04/01/2022 07:55   DG Chest 1 View  Result Date: 03/31/2022 CLINICAL DATA:  Intubation EXAM: CHEST  1 VIEW COMPARISON:  Earlier today FINDINGS: Endotracheal tube with tip just below the clavicular heads. An enteric tube tip and side-port reaches the stomach. Confluent airspace disease remains symmetric with peripheral sparing. Cardiopericardial enlargement accentuated by technique, no pericardial effusion by prior CT. IMPRESSION: 1. Unremarkable hardware. 2. Unchanged severe airspace disease. Electronically Signed   By: Tiburcio Pea M.D.   On: 03/31/2022 06:23   CT Angio Chest PE W and/or Wo Contrast  Result Date: 03/31/2022 CLINICAL DATA:  Hypoxic respiratory failure, evaluate for PE. Influenza positive. Query sepsis. EXAM: CT ANGIOGRAPHY CHEST CT ABDOMEN AND PELVIS WITH CONTRAST TECHNIQUE: Multidetector CT imaging of the chest was performed using the standard protocol during bolus administration of intravenous contrast. Multiplanar CT image reconstructions and MIPs were obtained to evaluate the vascular anatomy. Multidetector CT imaging of the abdomen and pelvis was performed using the standard protocol during bolus administration of intravenous contrast. RADIATION DOSE REDUCTION: This exam was performed according to the departmental dose-optimization program which includes automated exposure control, adjustment of  the mA and/or kV according to patient size and/or use of iterative reconstruction technique. CONTRAST:  OMNIPAQUE IOHEXOL 350 MG/ML SOLN COMPARISON:  Chest radiographs earlier today and CT abdomen and pelvis 03/09/2021. FINDINGS: CTA CHEST FINDINGS Cardiovascular: Satisfactory opacification of the pulmonary arteries to the segmental level. No evidence of pulmonary embolism. Cardiomegaly. Small pericardial effusion.  Mediastinum/Nodes: Endotracheal tube in the low intrathoracic trachea approximately 5 mm from the carina. Recommend retraction approximately 2-3 cm. Enteric tube tip in the stomach. No enlarged mediastinal, hilar, or axillary lymph nodes. Thyroid gland, trachea, and esophagus demonstrate no significant findings. Lungs/Pleura: Ground glass and peribronchovascular predominant consolidative opacities greatest centrally in both lungs. No pleural effusion or pneumothorax. Musculoskeletal: No chest wall abnormality. No acute osseous findings. Review of the MIP images confirms the above findings. CT ABDOMEN and PELVIS FINDINGS Hepatobiliary: No focal liver abnormality is seen. No gallstones, gallbladder wall thickening, or biliary dilatation. Periportal edema. Pancreas: Unremarkable. No pancreatic ductal dilatation or surrounding inflammatory changes. Spleen: The spleen is enlarged measuring 15.1 cm in craniocaudal dimension. Adrenals/Urinary Tract: Unremarkable adrenal glands. Questionable patchy enhancement of the left kidney though this is partially obscured by artifact. Prominent left renal pelvis with perinephric stranding about the left renal pelvis. No hydroureter or obstructing calculi. Foley catheter in the nondistended bladder. Stomach/Bowel: Enteric tube tip in the stomach. Normal caliber large and small bowel. Appendectomy. Bowel wall thickening. Vascular/Lymphatic: No significant vascular findings are present. No enlarged abdominal or pelvic lymph nodes. Reproductive: Prostate is unremarkable.  Other: Trace free fluid in the pelvis.  Mild body wall edema. Musculoskeletal: No acute osseous abnormality. Review of the MIP images confirms the above findings. IMPRESSION: 1. Perihilar predominant bilateral ground-glass and consolidative opacities are nonspecific but can be seen in the setting of multifocal pneumonia, acute respiratory distress syndrome, or pulmonary edema. 2. The ETT is in the low intrathoracic trachea approximately 5 mm from the carina. Recommend retraction approximately 2-3 cm. 3. Question patchy enhancement of the left kidney versus artifact. There is mild prominence of the left renal pelvis with adjacent stranding. Pyelonephritis is difficult to exclude. 4. Splenomegaly. 5. Small volume free fluid in the pelvis and body wall anasarca. Electronically Signed   By: Minerva Fester M.D.   On: 03/31/2022 03:59   CT ABDOMEN PELVIS W CONTRAST  Result Date: 03/31/2022 CLINICAL DATA:  Hypoxic respiratory failure, evaluate for PE. Influenza positive. Query sepsis. EXAM: CT ANGIOGRAPHY CHEST CT ABDOMEN AND PELVIS WITH CONTRAST TECHNIQUE: Multidetector CT imaging of the chest was performed using the standard protocol during bolus administration of intravenous contrast. Multiplanar CT image reconstructions and MIPs were obtained to evaluate the vascular anatomy. Multidetector CT imaging of the abdomen and pelvis was performed using the standard protocol during bolus administration of intravenous contrast. RADIATION DOSE REDUCTION: This exam was performed according to the departmental dose-optimization program which includes automated exposure control, adjustment of the mA and/or kV according to patient size and/or use of iterative reconstruction technique. CONTRAST:  OMNIPAQUE IOHEXOL 350 MG/ML SOLN COMPARISON:  Chest radiographs earlier today and CT abdomen and pelvis 03/09/2021. FINDINGS: CTA CHEST FINDINGS Cardiovascular: Satisfactory opacification of the pulmonary arteries to the segmental  level. No evidence of pulmonary embolism. Cardiomegaly. Small pericardial effusion. Mediastinum/Nodes: Endotracheal tube in the low intrathoracic trachea approximately 5 mm from the carina. Recommend retraction approximately 2-3 cm. Enteric tube tip in the stomach. No enlarged mediastinal, hilar, or axillary lymph nodes. Thyroid gland, trachea, and esophagus demonstrate no significant findings. Lungs/Pleura: Ground glass and peribronchovascular predominant consolidative opacities greatest centrally in both lungs. No pleural effusion or pneumothorax. Musculoskeletal: No chest wall abnormality. No acute osseous findings. Review of the MIP images confirms the above findings. CT ABDOMEN and PELVIS FINDINGS Hepatobiliary: No focal liver abnormality is seen. No gallstones, gallbladder wall thickening, or biliary dilatation. Periportal edema. Pancreas: Unremarkable. No pancreatic ductal dilatation or  surrounding inflammatory changes. Spleen: The spleen is enlarged measuring 15.1 cm in craniocaudal dimension. Adrenals/Urinary Tract: Unremarkable adrenal glands. Questionable patchy enhancement of the left kidney though this is partially obscured by artifact. Prominent left renal pelvis with perinephric stranding about the left renal pelvis. No hydroureter or obstructing calculi. Foley catheter in the nondistended bladder. Stomach/Bowel: Enteric tube tip in the stomach. Normal caliber large and small bowel. Appendectomy. Bowel wall thickening. Vascular/Lymphatic: No significant vascular findings are present. No enlarged abdominal or pelvic lymph nodes. Reproductive: Prostate is unremarkable. Other: Trace free fluid in the pelvis.  Mild body wall edema. Musculoskeletal: No acute osseous abnormality. Review of the MIP images confirms the above findings. IMPRESSION: 1. Perihilar predominant bilateral ground-glass and consolidative opacities are nonspecific but can be seen in the setting of multifocal pneumonia, acute respiratory  distress syndrome, or pulmonary edema. 2. The ETT is in the low intrathoracic trachea approximately 5 mm from the carina. Recommend retraction approximately 2-3 cm. 3. Question patchy enhancement of the left kidney versus artifact. There is mild prominence of the left renal pelvis with adjacent stranding. Pyelonephritis is difficult to exclude. 4. Splenomegaly. 5. Small volume free fluid in the pelvis and body wall anasarca. Electronically Signed   By: Placido Sou M.D.   On: 03/31/2022 03:59   DG Chest Portable 1 View  Result Date: 03/31/2022 CLINICAL DATA:  Post intubation and OG placement EXAM: PORTABLE CHEST 1 VIEW COMPARISON:  Radiographs earlier today at 12:30 a.m. FINDINGS: Endotracheal tube in the intrathoracic trachea 2.7 cm from the carina. OG tube tip in the stomach with side port likely in the distal esophagus. Recommend advancement approximately 4-5 cm. Stable cardiomegaly. Similar to slightly increased hazy bilateral perihilar predominant airspace opacities suspicious for edema. No pleural effusion or pneumothorax. IMPRESSION: 1. ETT in good position. 2. OG tube tip in the stomach with side port likely in the distal esophagus. Recommend advancement approximately 4-5 cm to ensure side port is within the stomach. 3. Similar to increased perihilar airspace opacities suggestive of pulmonary edema though infection is not excluded. Electronically Signed   By: Placido Sou M.D.   On: 03/31/2022 03:04   DG Chest Portable 1 View  Result Date: 03/31/2022 CLINICAL DATA:  Dyspnea EXAM: PORTABLE CHEST 1 VIEW COMPARISON:  03/10/2021 FINDINGS: The lungs are symmetrically expanded. Perihilar airspace infiltrate is present, new since prior examination, in keeping with perihilar alveolar pulmonary edema or multifocal infection. No pneumothorax or pleural effusion. Cardiac size is within normal limits. Previously noted left internal jugular hemodialysis catheter has been removed. No acute bone  abnormality. IMPRESSION: 1. Interval development of perihilar airspace infiltrate, in keeping with perihilar alveolar pulmonary edema or multifocal infection. Electronically Signed   By: Fidela Salisbury M.D.   On: 03/31/2022 00:48    Microbiology: Results for orders placed or performed during the hospital encounter of 03/31/22  Blood Culture (routine x 2)     Status: None   Collection Time: 03/31/22 12:34 AM   Specimen: BLOOD  Result Value Ref Range Status   Specimen Description BLOOD RIGHT ANTECUBITAL  Final   Special Requests   Final    BOTTLES DRAWN AEROBIC AND ANAEROBIC Blood Culture results may not be optimal due to an excessive volume of blood received in culture bottles   Culture   Final    NO GROWTH 5 DAYS Performed at [facility], [address] [place], [place] [address]    Report Status 04/05/2022 FINAL  Final  Resp panel by RT-PCR (  RSV, Flu A&B, Covid) Anterior Nasal Swab     Status: Abnormal   Collection Time: 03/31/22 12:34 AM   Specimen: Anterior Nasal Swab  Result Value Ref Range Status   SARS Coronavirus 2 by RT PCR NEGATIVE NEGATIVE Final    Comment: (NOTE) SARS-CoV-2 target nucleic acids are NOT DETECTED.  The SARS-CoV-2 RNA is generally detectable in upper respiratory specimens during the acute phase of infection. The lowest concentration of SARS-CoV-2 viral copies this assay can detect is 138 copies/mL. A negative result does not preclude SARS-Cov-2 infection and should not be used as the sole basis for treatment or other patient management decisions. A negative result may occur with  improper specimen collection/handling, submission of specimen other than nasopharyngeal swab, presence of viral mutation(s) within the areas targeted by this assay, and inadequate number of viral copies(<138 copies/mL). A negative result must be combined with clinical observations, patient history, and epidemiological information. The expected result is  Negative.  Fact Sheet for Patients:  BloggerCourse.comhttps://www.fda.gov/media/152166/download  Fact Sheet for Healthcare Providers:  SeriousBroker.ithttps://www.fda.gov/media/152162/download  This test is no t yet approved or cleared by the [place] FDA and  has been authorized for detection and/or diagnosis of SARS-CoV-2 by FDA under an Emergency Use Authorization (EUA). This EUA will remain  in effect (meaning this test can be used) for the duration of the COVID-19 declaration under Section 564(b)(1) of the Act, 21 U.S.C.section 360bbb-3(b)(1), unless the authorization is terminated  or revoked sooner.       Influenza A by PCR POSITIVE (A) NEGATIVE Final   Influenza B by PCR NEGATIVE NEGATIVE Final    Comment: (NOTE) The Xpert Xpress SARS-CoV-2/FLU/RSV plus assay is intended as an aid in the diagnosis of influenza from Nasopharyngeal swab specimens and should not be used as a sole basis for treatment. Nasal washings and aspirates are unacceptable for Xpert Xpress SARS-CoV-2/FLU/RSV testing.  Fact Sheet for Patients: BloggerCourse.comhttps://www.fda.gov/media/152166/download  Fact Sheet for Healthcare Providers: SeriousBroker.ithttps://www.fda.gov/media/152162/download  This test is not yet approved or cleared by the [place] FDA and has been authorized for detection and/or diagnosis of SARS-CoV-2 by FDA under an Emergency Use Authorization (EUA). This EUA will remain in effect (meaning this test can be used) for the duration of the COVID-19 declaration under Section 564(b)(1) of the Act, 21 U.S.C. section 360bbb-3(b)(1), unless the authorization is terminated or revoked.     Resp Syncytial Virus by PCR NEGATIVE NEGATIVE Final    Comment: (NOTE) Fact Sheet for Patients: BloggerCourse.comhttps://www.fda.gov/media/152166/download  Fact Sheet for Healthcare Providers: SeriousBroker.ithttps://www.fda.gov/media/152162/download  This test is not yet approved or cleared by the [place] FDA and has been authorized for detection and/or diagnosis of  SARS-CoV-2 by FDA under an Emergency Use Authorization (EUA). This EUA will remain in effect (meaning this test can be used) for the duration of the COVID-19 declaration under Section 564(b)(1) of the Act, 21 U.S.C. section 360bbb-3(b)(1), unless the authorization is terminated or revoked.  Performed at [facility], [address] [place], [place] [address]   Blood Culture (routine x 2)     Status: None   Collection Time: 03/31/22 12:55 AM   Specimen: BLOOD  Result Value Ref Range Status   Specimen Description BLOOD LEFT ANTECUBITAL  Final   Special Requests   Final    BOTTLES DRAWN AEROBIC AND ANAEROBIC Blood Culture results may not be optimal due to an excessive volume of blood received in culture bottles   Culture   Final    NO GROWTH 5 DAYS Performed  at [facility], [address] [place], [place] [address]    Report Status 04/05/2022 FINAL  Final  Urine Culture     Status: None   Collection Time: 03/31/22  2:59 AM   Specimen: Urine, Clean Catch  Result Value Ref Range Status   Specimen Description   Final    URINE, CLEAN CATCH Performed at [facility], [address] [place], [place] [address]    Special Requests   Final    NONE Performed at [facility], [address] [place], [place] [address]    Culture   Final    NO GROWTH Performed at [facility], [address] [place], [place] [address]    Report Status 04/01/2022 FINAL  Final  Respiratory (~20 pathogens) panel by PCR     Status: Abnormal   Collection Time: 03/31/22  2:59 AM   Specimen: Urine, Clean Catch; Respiratory  Result Value Ref Range Status   Adenovirus NOT DETECTED NOT DETECTED Final   Coronavirus 229E NOT DETECTED NOT DETECTED Final    Comment: (NOTE) The Coronavirus on the Respiratory Panel, DOES NOT test for the novel  Coronavirus (2019 nCoV)    Coronavirus HKU1 NOT DETECTED NOT DETECTED Final   Coronavirus NL63 NOT DETECTED NOT DETECTED  Final   Coronavirus OC43 NOT DETECTED NOT DETECTED Final   Metapneumovirus NOT DETECTED NOT DETECTED Final   Rhinovirus / Enterovirus NOT DETECTED NOT DETECTED Final   Influenza A H1 2009 DETECTED (A) NOT DETECTED Final   Influenza B NOT DETECTED NOT DETECTED Final   Parainfluenza Virus 1 NOT DETECTED NOT DETECTED Final   Parainfluenza Virus 2 NOT DETECTED NOT DETECTED Final   Parainfluenza Virus 3 NOT DETECTED NOT DETECTED Final   Parainfluenza Virus 4 NOT DETECTED NOT DETECTED Final   Respiratory Syncytial Virus NOT DETECTED NOT DETECTED Final   Bordetella pertussis NOT DETECTED NOT DETECTED Final   Bordetella Parapertussis NOT DETECTED NOT DETECTED Final   Chlamydophila pneumoniae NOT DETECTED NOT DETECTED Final   Mycoplasma pneumoniae NOT DETECTED NOT DETECTED Final    Comment: Performed at [facility], [address] [place], [place] [address]  MRSA Next Gen by PCR, Nasal     Status: None   Collection Time: 03/31/22  4:45 AM   Specimen: Nasal Mucosa; Nasal Swab  Result Value Ref Range Status   MRSA by PCR Next Gen NOT DETECTED NOT DETECTED Final    Comment: (NOTE) The GeneXpert MRSA Assay (FDA approved for NASAL specimens only), is one component of a comprehensive MRSA colonization surveillance program. It is not intended to diagnose MRSA infection nor to guide or monitor treatment for MRSA infections. Test performance is not FDA approved in patients less than 256 years old. Performed at [facility], [address] [place], [place] [address]   Culture, Respiratory w Gram Stain     Status: None   Collection Time: 04/01/22 11:23 AM   Specimen: Tracheal Aspirate; Respiratory  Result Value Ref Range Status   Specimen Description   Final    TRACHEAL ASPIRATE Performed at [facility], [address] [place], [place] [address]    Special Requests   Final    NONE Performed at [facility], [address] [place], [place] [address]     Gram Stain   Final    MODERATE WBC PRESENT, PREDOMINANTLY PMN NO ORGANISMS SEEN    Culture   Final    NO GROWTH 2  DAYS Performed at [facility], [address] [place], [place] [address]    Report Status 04/03/2022 FINAL  Final   Labs: CBC: Recent Labs  Lab 04/04/22 0704 04/05/22 0317 04/06/22 0319 04/07/22 0518  WBC 2.0* 1.4* 1.7* 2.1*  NEUTROABS 1.4* 0.8* 1.1* 1.4*  HGB 12.7* 11.9* 11.3* 11.4*  HCT 37.4* 35.1* 33.0* 32.7*  MCV 104.8* 104.2* 103.8* 102.8*  PLT 129* 110* 113* 96*   Basic Metabolic Panel: Recent Labs  Lab 04/04/22 0704 04/05/22 0317 04/06/22 0319 04/07/22 0518  NA 138 137 138 137  K 5.1 4.9 4.4 4.4  CL 105 104 106 101  CO2 21* 22 19* 25  GLUCOSE 177* 160* 153* 129*  BUN 42* 37* 28* 25*  CREATININE 0.81 0.67 0.66 0.67  CALCIUM 9.6 9.2 9.0 8.8*  MG 2.2 2.1 2.0 2.0  PHOS  --  4.2 3.9 4.3   Liver Function Tests: Recent Labs  Lab 04/05/22 0317 04/06/22 0319 04/07/22 0518  AST 50* 68* 57*  ALT 64* 88* 88*  ALKPHOS 81 101 87  BILITOT 1.2 1.1 1.4*  PROT 6.3* 6.1* 5.7*  ALBUMIN 3.3* 3.5 3.2*   CBG: Recent Labs  Lab 04/06/22 1557 04/06/22 2200 04/07/22 0739 04/07/22 1139 04/07/22 1600  GLUCAP 154* 152* 123* 103* 160*   Discharge time spent: greater than 30 minutes.  Signed: Marguerita Merles, DO [facility] 04/10/2022

## 2022-08-22 DIAGNOSIS — Z0000 Encounter for general adult medical examination without abnormal findings: Secondary | ICD-10-CM | POA: Diagnosis not present

## 2022-08-22 DIAGNOSIS — E785 Hyperlipidemia, unspecified: Secondary | ICD-10-CM | POA: Diagnosis not present

## 2022-08-22 DIAGNOSIS — Z23 Encounter for immunization: Secondary | ICD-10-CM | POA: Diagnosis not present

## 2022-08-22 DIAGNOSIS — N200 Calculus of kidney: Secondary | ICD-10-CM | POA: Diagnosis not present

## 2022-08-22 DIAGNOSIS — I10 Essential (primary) hypertension: Secondary | ICD-10-CM | POA: Diagnosis not present

## 2022-08-22 DIAGNOSIS — E611 Iron deficiency: Secondary | ICD-10-CM | POA: Diagnosis not present

## 2022-08-22 DIAGNOSIS — E538 Deficiency of other specified B group vitamins: Secondary | ICD-10-CM | POA: Diagnosis not present

## 2022-08-22 DIAGNOSIS — R932 Abnormal findings on diagnostic imaging of liver and biliary tract: Secondary | ICD-10-CM | POA: Diagnosis not present

## 2022-08-22 DIAGNOSIS — M1A0690 Idiopathic chronic gout, unspecified knee, without tophus (tophi): Secondary | ICD-10-CM | POA: Diagnosis not present

## 2022-12-26 DIAGNOSIS — J069 Acute upper respiratory infection, unspecified: Secondary | ICD-10-CM | POA: Diagnosis not present

## 2022-12-26 DIAGNOSIS — Z20822 Contact with and (suspected) exposure to covid-19: Secondary | ICD-10-CM | POA: Diagnosis not present

## 2023-01-11 DIAGNOSIS — M1A9XX1 Chronic gout, unspecified, with tophus (tophi): Secondary | ICD-10-CM | POA: Diagnosis not present

## 2023-01-11 DIAGNOSIS — R809 Proteinuria, unspecified: Secondary | ICD-10-CM | POA: Diagnosis not present

## 2023-01-11 DIAGNOSIS — R748 Abnormal levels of other serum enzymes: Secondary | ICD-10-CM | POA: Diagnosis not present

## 2023-01-11 DIAGNOSIS — M25562 Pain in left knee: Secondary | ICD-10-CM | POA: Diagnosis not present

## 2023-01-11 DIAGNOSIS — M25521 Pain in right elbow: Secondary | ICD-10-CM | POA: Diagnosis not present

## 2023-01-11 DIAGNOSIS — Z79899 Other long term (current) drug therapy: Secondary | ICD-10-CM | POA: Diagnosis not present

## 2023-01-11 DIAGNOSIS — M25561 Pain in right knee: Secondary | ICD-10-CM | POA: Diagnosis not present

## 2023-01-11 DIAGNOSIS — M79671 Pain in right foot: Secondary | ICD-10-CM | POA: Diagnosis not present

## 2023-01-11 DIAGNOSIS — M79672 Pain in left foot: Secondary | ICD-10-CM | POA: Diagnosis not present

## 2023-01-11 DIAGNOSIS — M25522 Pain in left elbow: Secondary | ICD-10-CM | POA: Diagnosis not present

## 2023-01-11 DIAGNOSIS — M25569 Pain in unspecified knee: Secondary | ICD-10-CM | POA: Diagnosis not present

## 2023-02-13 DIAGNOSIS — M25569 Pain in unspecified knee: Secondary | ICD-10-CM | POA: Diagnosis not present

## 2023-02-13 DIAGNOSIS — M1A9XX1 Chronic gout, unspecified, with tophus (tophi): Secondary | ICD-10-CM | POA: Diagnosis not present

## 2023-02-13 DIAGNOSIS — R748 Abnormal levels of other serum enzymes: Secondary | ICD-10-CM | POA: Diagnosis not present

## 2023-02-13 DIAGNOSIS — Z79899 Other long term (current) drug therapy: Secondary | ICD-10-CM | POA: Diagnosis not present

## 2023-02-22 DIAGNOSIS — E538 Deficiency of other specified B group vitamins: Secondary | ICD-10-CM | POA: Diagnosis not present

## 2023-02-22 DIAGNOSIS — R808 Other proteinuria: Secondary | ICD-10-CM | POA: Diagnosis not present

## 2023-02-22 DIAGNOSIS — M1A0690 Idiopathic chronic gout, unspecified knee, without tophus (tophi): Secondary | ICD-10-CM | POA: Diagnosis not present

## 2023-02-22 DIAGNOSIS — R932 Abnormal findings on diagnostic imaging of liver and biliary tract: Secondary | ICD-10-CM | POA: Diagnosis not present

## 2023-02-22 DIAGNOSIS — N200 Calculus of kidney: Secondary | ICD-10-CM | POA: Diagnosis not present

## 2023-02-22 DIAGNOSIS — I10 Essential (primary) hypertension: Secondary | ICD-10-CM | POA: Diagnosis not present

## 2023-02-22 DIAGNOSIS — E785 Hyperlipidemia, unspecified: Secondary | ICD-10-CM | POA: Diagnosis not present

## 2023-06-19 DIAGNOSIS — M25421 Effusion, right elbow: Secondary | ICD-10-CM | POA: Diagnosis not present

## 2023-06-19 DIAGNOSIS — R748 Abnormal levels of other serum enzymes: Secondary | ICD-10-CM | POA: Diagnosis not present

## 2023-06-19 DIAGNOSIS — M1A9XX1 Chronic gout, unspecified, with tophus (tophi): Secondary | ICD-10-CM | POA: Diagnosis not present

## 2023-06-19 DIAGNOSIS — Z79899 Other long term (current) drug therapy: Secondary | ICD-10-CM | POA: Diagnosis not present

## 2023-07-15 DIAGNOSIS — M1A9XX1 Chronic gout, unspecified, with tophus (tophi): Secondary | ICD-10-CM | POA: Diagnosis not present

## 2023-07-25 ENCOUNTER — Other Ambulatory Visit: Payer: Self-pay

## 2023-07-25 ENCOUNTER — Emergency Department

## 2023-07-25 ENCOUNTER — Emergency Department
Admission: EM | Admit: 2023-07-25 | Discharge: 2023-07-25 | Disposition: A | Discharge status: Home / Self Care | Attending: Emergency Medicine | Admitting: Emergency Medicine

## 2023-07-25 ENCOUNTER — Encounter: Payer: Self-pay | Admitting: Emergency Medicine

## 2023-07-25 DIAGNOSIS — M25511 Pain in right shoulder: Secondary | ICD-10-CM | POA: Diagnosis not present

## 2023-07-25 DIAGNOSIS — M7989 Other specified soft tissue disorders: Secondary | ICD-10-CM | POA: Diagnosis not present

## 2023-07-25 DIAGNOSIS — R079 Chest pain, unspecified: Secondary | ICD-10-CM | POA: Diagnosis not present

## 2023-07-25 DIAGNOSIS — R0789 Other chest pain: Secondary | ICD-10-CM | POA: Diagnosis not present

## 2023-07-25 DIAGNOSIS — M79672 Pain in left foot: Secondary | ICD-10-CM | POA: Diagnosis not present

## 2023-07-25 DIAGNOSIS — R072 Precordial pain: Secondary | ICD-10-CM | POA: Diagnosis not present

## 2023-07-25 DIAGNOSIS — M79671 Pain in right foot: Secondary | ICD-10-CM | POA: Insufficient documentation

## 2023-07-25 LAB — COMPREHENSIVE METABOLIC PANEL WITH GFR
ALT: 86 U/L — ABNORMAL HIGH (ref 0–44)
AST: 44 U/L — ABNORMAL HIGH (ref 15–41)
Albumin: 4.4 g/dL (ref 3.5–5.0)
Alkaline Phosphatase: 71 U/L (ref 38–126)
Anion gap: 11 (ref 5–15)
BUN: 13 mg/dL (ref 6–20)
CO2: 24 mmol/L (ref 22–32)
Calcium: 10 mg/dL (ref 8.9–10.3)
Chloride: 102 mmol/L (ref 98–111)
Creatinine, Ser: 0.68 mg/dL (ref 0.61–1.24)
GFR, Estimated: >60 mL/min (ref >60)
Glucose, Bld: 107 mg/dL — ABNORMAL HIGH (ref 70–99)
Potassium: 3.8 mmol/L (ref 3.5–5.1)
Sodium: 137 mmol/L (ref 135–145)
Total Bilirubin: 1.4 mg/dL — ABNORMAL HIGH (ref 0.0–1.2)
Total Protein: 8.4 g/dL — ABNORMAL HIGH (ref 6.5–8.1)

## 2023-07-25 LAB — CBC WITH DIFFERENTIAL/PLATELET
Abs Immature Granulocytes: 0.02 10*3/uL (ref 0.00–0.07)
Basophils Absolute: 0 10*3/uL (ref 0.0–0.1)
Basophils Relative: 1 %
Eosinophils Absolute: 0 10*3/uL (ref 0.0–0.5)
Eosinophils Relative: 0 %
HCT: 39.3 % (ref 39.0–52.0)
Hemoglobin: 13.6 g/dL (ref 13.0–17.0)
Immature Granulocytes: 0 %
Lymphocytes Relative: 18 %
Lymphs Abs: 0.9 10*3/uL (ref 0.7–4.0)
MCH: 37.4 pg — ABNORMAL HIGH (ref 26.0–34.0)
MCHC: 34.6 g/dL (ref 30.0–36.0)
MCV: 108 fL — ABNORMAL HIGH (ref 80.0–100.0)
Monocytes Absolute: 0.3 10*3/uL (ref 0.1–1.0)
Monocytes Relative: 5 %
Neutro Abs: 4 10*3/uL (ref 1.7–7.7)
Neutrophils Relative %: 76 %
Platelets: 141 10*3/uL — ABNORMAL LOW (ref 150–400)
RBC: 3.64 MIL/uL — ABNORMAL LOW (ref 4.22–5.81)
RDW: 13.6 % (ref 11.5–15.5)
WBC: 5.3 10*3/uL (ref 4.0–10.5)
nRBC: 0 % (ref 0.0–0.2)

## 2023-07-25 LAB — TROPONIN I (HIGH SENSITIVITY): Troponin I (High Sensitivity): 3 ng/L (ref <18)

## 2023-07-25 LAB — URIC ACID: Uric Acid, Serum: <1.5 mg/dL — ABNORMAL LOW (ref 3.7–8.6)

## 2023-07-25 MED ORDER — HYDROCODONE-ACETAMINOPHEN 5-325 MG PO TABS: 1.0000 | ORAL_TABLET | Freq: Four times a day (QID) | ORAL | 0 refills | Status: AC | PRN

## 2023-07-25 MED ORDER — HYDROCODONE-ACETAMINOPHEN 5-325 MG PO TABS
1.0000 | ORAL_TABLET | Freq: Once | ORAL | Status: AC
  Administered 2023-07-25: 1 via ORAL
  Filled 2023-07-25: qty 1

## 2023-07-25 NOTE — ED Provider Notes (Signed)
 [facility] Provider Note    Event Date/Time   First MD Initiated Contact with Patient 07/25/23 1016     (approximate)   History   Foot Pain and Chest Pain   HPI  Curtis Erickson is a 33 year old male with history of recurrent gout presenting to the emergency department for evaluation of foot and sternal pain.  Patient follows with rheumatology for his gout.  Recently started on IV infusion of Uloric.  He was told he was possibly would have a gout flare in between his 1st and 2nd treatment.  He recently developed worsening pain in his bilateral feet consistent with his prior gout episodes as well as pain over his sternum and his right clavicle.  Reports he has had discomfort in this area in the past that his rheumatologist thought was related to gout, but this is more severe than past episodes.  His rheumatologist is currently out of town.  Called into the office and had a prednisone prescription sent just a few minutes prior to my evaluation.  Reports he has had some improvement with colchicine during his gout flares in the past as well.     Physical Exam   Triage Vital Signs: ED Triage Vitals  Encounter Vitals Group     BP 07/25/23 0948 (!) 168/98     Systolic BP Percentile --      Diastolic BP Percentile --      Pulse Rate 07/25/23 0948 (!) 107     Resp 07/25/23 0959 18     Temp 07/25/23 0948 98.4 F (36.9 C)     Temp src --      SpO2 07/25/23 0948 100 %     Weight 07/25/23 0953 190 lb (86.2 kg)     Height 07/25/23 0953 5\' 8"  (1.727 m)     Head Circumference --      Peak Flow --      Pain Score 07/25/23 0953 10     Pain Loc --      Pain Education --      Exclude from Growth Chart --     Most recent vital signs: Vitals:   07/25/23 0948 07/25/23 0959  BP: (!) 168/98   Pulse: (!) 107   Resp:  18  Temp: 98.4 F (36.9 C)   SpO2: 100%      General: Awake, interactive  CV:  Regular rate, good peripheral perfusion.  Chest wall:   Reproducible tenderness to palpation over the mid sternum into the right clavicle, no tenderness of the remainder of the chest wall  Resp:  Unlabored respirations, lungs clear auscultation Abd:  Nondistended.  Neuro:  Symmetric facial movement, fluid speech MSK:  Tenderness to palpation over the midfoot, freely ranging toes without appreciable discomfort along the MTP joints.  Asymmetric swelling greater on the left lower extremity.   ED Results / Procedures / Treatments   Labs (all labs ordered are listed, but only abnormal results are displayed) Labs Reviewed  CBC WITH DIFFERENTIAL/PLATELET - Abnormal; Notable for the following components:      Result Value   RBC 3.64 (*)    MCV 108.0 (*)    MCH 37.4 (*)    Platelets 141 (*)    All other components within normal limits  COMPREHENSIVE METABOLIC PANEL WITH GFR - Abnormal; Notable for the following components:   Glucose, Bld 107 (*)    Total Protein 8.4 (*)    AST 44 (*)    ALT  86 (*)    Total Bilirubin 1.4 (*)    All other components within normal limits  URIC ACID - Abnormal; Notable for the following components:   Uric Acid, Serum <1.5 (*)    All other components within normal limits  TROPONIN I (HIGH SENSITIVITY)     EKG EKG independently reviewed interpreted by myself (ER attending) demonstrates:    RADIOLOGY Imaging independently reviewed and interpreted by myself demonstrates:   Formal Radiology Read:  DG Chest 2 View Result Date: 07/25/2023 CLINICAL DATA:  Chest pain. EXAM: CHEST - 2 VIEW COMPARISON:  04/07/2022 FINDINGS: Cardiomediastinal silhouette and pulmonary vasculature are within normal limits. Lungs are clear. IMPRESSION: No acute cardiopulmonary process. Electronically Signed   By: Acquanetta Belling M.D.   On: 07/25/2023 12:09   US Venous Img Lower  Left (DVT Study) Result Date: 07/25/2023 CLINICAL DATA:  Left-greater-than-right lower extremity swelling EXAM: LEFT LOWER EXTREMITY VENOUS DOPPLER ULTRASOUND  TECHNIQUE: Gray-scale sonography with compression, as well as color and duplex ultrasound, were performed to evaluate the deep venous system(s) from the level of the common femoral vein through the popliteal and proximal calf veins. COMPARISON:  None available FINDINGS: VENOUS Normal compressibility of the common femoral, superficial femoral, and popliteal veins, as well as the visualized calf veins. Visualized portions of profunda femoral vein and great saphenous vein unremarkable. No filling defects to suggest DVT on grayscale or color Doppler imaging. Doppler waveforms show normal direction of venous flow, normal respiratory plasticity and response to augmentation. Limited views of the contralateral common femoral vein are unremarkable. OTHER None. Limitations: none IMPRESSION: No DVT of the left lower extremity. Electronically Signed   By: Acquanetta Belling M.D.   On: 07/25/2023 12:08    PROCEDURES:  Critical Care performed: No  Procedures   MEDICATIONS ORDERED IN ED: Medications  HYDROcodone-acetaminophen (NORCO/VICODIN) 5-325 MG per tablet 1 tablet (1 tablet Oral Given 07/25/23 1043)     IMPRESSION / MDM / ASSESSMENT AND PLAN / ED COURSE  I reviewed the triage vital signs and the nursing notes.  Differential diagnosis includes, but is not limited to, gout flare exacerbated by recent treatment initiation, DVT, no history of trauma, pneumonia, pneumothorax, lower suspicion fracture  Patient's presentation is most consistent with acute presentation with potential threat to life or bodily function.  33 year old male presenting with foot and sternal pain.  Mild tachycardia on presentation.  Labs sent from triage overall reassuring including negative troponin with greater than 3 hours of symptoms and normal white blood cell count.  Low uric acid level though difficult to interpret in the setting of recent treatment for uric acid.  Chest x-Baneen Wieseler and DVT ultrasound reassuring.  12:38 PM Patient  reassessed and updated on results of workup.  He has a prednisone prescription that has been sent from his rheumatology office which she reports is most effective in treating his gout flares.  Given acute worsening of his symptoms, will DC with short course of pain medication for breakthrough pain.  Patient is comfortable discharge home and outpatient follow-up.  Strict return precautions provided.      FINAL CLINICAL IMPRESSION(S) / ED DIAGNOSES   Final diagnoses:  Foot pain, bilateral  Sternal pain     Rx / DC Orders   ED Discharge Orders          Ordered    HYDROcodone-acetaminophen (NORCO/VICODIN) 5-325 MG tablet  Every 6 hours PRN        07/25/23 1238  Note:  This document was prepared using Dragon voice recognition software and may include unintentional dictation errors.   Claria Crofts, MD 07/25/23 [phone]

## 2023-07-25 NOTE — Discharge Instructions (Addendum)
 You were seen for pain in your feet and sternum.  Your testing fortunately did not show an emergency cause for this.  This may be related to a gout flare.  You can take the prednisone course that was sent by your rheumatology office.    You can take 1000 mg of Tylenol every 6 hours and 600 mg of ibuprofen every 6 hours to help with your pain. Do not take more than 4 grams of Tylenol in a day. You can also try over the counter lidocaine patches for the pain over your chest wall.  If you have breakthrough pain, I have sent a short course of narcotic pain medication to your pharmacy. This can make you drowsy. Do not drive or operate machinery when taking this.   Return to the ER for any new or worsening symptoms.

## 2023-07-25 NOTE — ED Triage Notes (Signed)
 Pt c/o bilateral foot pain and pain in his sternum and clavicles that radiate around his neck. Pt reports feeling a lump in his throat when he swallows. Pt started a new infusion for gout on 4/7.  Pt was told that he could potentially have a gout flare between the 1st and 2nd infusions and his 2nd infusion is scheduled 4/21. Pt took his last dose of prednisone yesterday.

## 2023-07-29 DIAGNOSIS — M1A9XX1 Chronic gout, unspecified, with tophus (tophi): Secondary | ICD-10-CM | POA: Diagnosis not present

## 2023-08-12 DIAGNOSIS — M1A9XX1 Chronic gout, unspecified, with tophus (tophi): Secondary | ICD-10-CM | POA: Diagnosis not present

## 2023-08-27 DIAGNOSIS — R7989 Other specified abnormal findings of blood chemistry: Secondary | ICD-10-CM | POA: Diagnosis not present

## 2023-08-27 DIAGNOSIS — Z0000 Encounter for general adult medical examination without abnormal findings: Secondary | ICD-10-CM | POA: Diagnosis not present

## 2023-08-27 DIAGNOSIS — R739 Hyperglycemia, unspecified: Secondary | ICD-10-CM | POA: Diagnosis not present

## 2023-08-27 DIAGNOSIS — E785 Hyperlipidemia, unspecified: Secondary | ICD-10-CM | POA: Diagnosis not present

## 2023-08-27 DIAGNOSIS — M1A0690 Idiopathic chronic gout, unspecified knee, without tophus (tophi): Secondary | ICD-10-CM | POA: Diagnosis not present

## 2023-08-27 DIAGNOSIS — Z125 Encounter for screening for malignant neoplasm of prostate: Secondary | ICD-10-CM | POA: Diagnosis not present

## 2023-08-27 DIAGNOSIS — I10 Essential (primary) hypertension: Secondary | ICD-10-CM | POA: Diagnosis not present

## 2023-08-27 DIAGNOSIS — R808 Other proteinuria: Secondary | ICD-10-CM | POA: Diagnosis not present

## 2023-08-27 DIAGNOSIS — E538 Deficiency of other specified B group vitamins: Secondary | ICD-10-CM | POA: Diagnosis not present

## 2023-08-27 DIAGNOSIS — Z1331 Encounter for screening for depression: Secondary | ICD-10-CM | POA: Diagnosis not present

## 2023-09-25 ENCOUNTER — Encounter: Payer: Self-pay | Admitting: Emergency Medicine

## 2023-09-25 ENCOUNTER — Ambulatory Visit: Admission: EM | Admit: 2023-09-25 | Discharge: 2023-09-25 | Disposition: A | Discharge status: Home / Self Care

## 2023-09-25 DIAGNOSIS — M25421 Effusion, right elbow: Secondary | ICD-10-CM | POA: Diagnosis not present

## 2023-09-25 DIAGNOSIS — M25521 Pain in right elbow: Secondary | ICD-10-CM

## 2023-09-25 NOTE — ED Provider Notes (Signed)
 Curtis Erickson    CSN: [id number] Arrival date & time: 09/25/23  0908      History   Chief Complaint Chief Complaint  Patient presents with   Joint Swelling    HPI Curtis Erickson is a 33 y.o. male.  Patient presents with right elbow pain and swelling x 3 days.  He notes that 5 days ago he was pushing a wagon and felt a pop in his elbow.  He reports some numbness in his hand when he woke up this morning which he attributes to having to sleep with his elbow hanging off the bed.  The numbness resolved shortly after he got up.  He denies current numbness.  No weakness, fever, chills, wounds.  The history is provided by the patient and medical records.    Past Medical History:  Diagnosis Date   Gout    Hypertension    Kidney stone     Patient Active Problem List   Diagnosis Date Noted   Acute metabolic encephalopathy 04/03/2022   Respiratory failure with hypoxia (HCC) 04/02/2022   Acute respiratory failure with hypoxia (HCC) 03/31/2022   Influenza A 03/31/2022   Pneumonia of both lungs due to infectious organism 03/31/2022   Acute liver failure without hepatic coma    Lactic acidosis 03/10/2021   Anasarca 03/10/2021   Liver injury 03/10/2021   Hepatorenal failure (HCC) 03/10/2021   Acute hepatitis 03/09/2021   Acute hypoxemic respiratory failure (HCC) 03/09/2021   Gout 03/09/2021   High anion gap metabolic acidosis 03/09/2021   Respiratory alkalosis 03/09/2021   Sinus tachycardia 03/09/2021   AKI (acute kidney injury) (HCC) 03/09/2021   Injury to liver    Severe sepsis without septic shock (CODE) (HCC) 09/19/2019   Acute pyelonephritis 09/19/2019   B12 deficiency 09/19/2019   Folate deficiency 09/19/2019   Dietary iron deficiency 09/19/2019   ETOH abuse 09/19/2019   Pancytopenia (HCC) 09/19/2019   Alcoholic fatty liver 09/19/2019    Past Surgical History:  Procedure Laterality Date   APPENDECTOMY     KIDNEY STONE SURGERY         Home  Medications    Prior to Admission medications   Medication Sig Start Date End Date Taking? Authorizing Provider  albuterol  (VENTOLIN  HFA) 108 (90 Base) MCG/ACT inhaler Inhale 2 puffs into the lungs every 6 (six) hours as needed for wheezing or shortness of breath. 04/07/22   Sheikh, Jonel Nephew Latif, DO  amLODipine  (NORVASC ) 5 MG tablet Take 1 tablet (5 mg total) by mouth daily. 04/08/22   Sheikh, Omair Latif, DO  colchicine  0.6 MG tablet Take 1 tablet (0.6 mg total) by mouth daily for 5 days. 03/16/21 03/21/21  Avi Body, MD  cyanocobalamin  1000 MCG tablet Take 1 tablet (1,000 mcg total) by mouth daily. 04/07/22   Sheikh, Omair Latif, DO  feeding supplement (ENSURE ENLIVE / ENSURE PLUS) LIQD Take 237 mLs by mouth 2 (two) times daily between meals. 04/07/22   Aura Leeds Latif, DO  folic acid  (FOLVITE ) 1 MG tablet Take 1 tablet (1 mg total) by mouth daily. 04/07/22   Sheikh, Omair Latif, DO  Multiple Vitamin (MULTIVITAMIN WITH MINERALS) TABS tablet Take 1 tablet by mouth daily. 04/07/22   Aura Leeds Latif, DO  pantoprazole  (PROTONIX ) 40 MG tablet Take 1 tablet (40 mg total) by mouth daily. 04/07/22 07/06/22  Sheikh, Omair Latif, DO  polyethylene glycol (MIRALAX  / GLYCOLAX ) 17 g packet Take 17 g by mouth daily as needed for moderate constipation. 04/07/22  Sheikh, Omair Latif, DO  thiamine  (VITAMIN B1) 100 MG tablet Take 1 tablet (100 mg total) by mouth daily. 04/07/22   Eveline Hipps, DO    Family History History reviewed. No pertinent family history.  Social History Social History   Tobacco Use   Smoking status: Never   Smokeless tobacco: Never  Vaping Use   Vaping status: Never Used  Substance Use Topics   Alcohol use: Not Currently   Drug use: Not Currently     Allergies   Allopurinol   Review of Systems Review of Systems  Constitutional:  Negative for chills and fever.  Respiratory:  Negative for cough and shortness of breath.   Cardiovascular:  Negative for  chest pain and palpitations.  Musculoskeletal:  Positive for arthralgias and joint swelling.  Skin:  Positive for color change. Negative for wound.  Neurological:  Positive for numbness. Negative for weakness.     Physical Exam Triage Vital Signs ED Triage Vitals  Encounter Vitals Group     BP      Girls Systolic BP Percentile      Girls Diastolic BP Percentile      Boys Systolic BP Percentile      Boys Diastolic BP Percentile      Pulse      Resp      Temp      Temp src      SpO2      Weight      Height      Head Circumference      Peak Flow      Pain Score      Pain Loc      Pain Education      Exclude from Growth Chart    No data found.  Updated Vital Signs BP (!) 142/75   Pulse 64   Temp 98 F (36.7 C)   Resp 16   SpO2 99%   Visual Acuity Right Eye Distance:   Left Eye Distance:   Bilateral Distance:    Right Eye Near:   Left Eye Near:    Bilateral Near:     Physical Exam Constitutional:      General: He is not in acute distress. HENT:     Mouth/Throat:     Mouth: Mucous membranes are moist.   Cardiovascular:     Rate and Rhythm: Normal rate and regular rhythm.     Heart sounds: Normal heart sounds.  Pulmonary:     Effort: Pulmonary effort is normal. No respiratory distress.     Breath sounds: Normal breath sounds.   Musculoskeletal:        General: Swelling and tenderness present. No deformity.     Comments: Right elbow tenderness, moderate edema, mild erythema.  No wounds.  Unable to fully extend elbow due to swelling and discomfort.  Sensation intact, 2+ radial pulse, strong handgrip.   Skin:    General: Skin is warm and dry.     Findings: Erythema present. No bruising or lesion.   Neurological:     Mental Status: He is alert.      UC Treatments / Results  Labs (all labs ordered are listed, but only abnormal results are displayed) Labs Reviewed - No data to display  EKG   Radiology No results found.  Procedures Procedures  (including critical care time)  Medications Ordered in UC Medications - No data to display  Initial Impression / Assessment and Plan / UC Course  I have reviewed  the triage vital signs and the nursing notes.  Pertinent labs & imaging results that were available during my care of the patient were reviewed by me and considered in my medical decision making (see chart for details).    Right elbow pain and swelling.  Discussed treatment options, including outpatient x-ray ordered here, Ace wrap, following up with orthopedics.  Patient opts to go to [facility] orthopedic urgent care now.  He declines outpatient x-ray ordered here.  He declines Ace wrap.  He is pleasant and agreeable to this and will go to [facility] outpatient walk-in urgent care at this time.  Final Clinical Impressions(s) / UC Diagnoses   Final diagnoses:  Right elbow pain  Elbow swelling, right     Discharge Instructions      As discussed, follow-up with [facility] urgent care now.     ED Prescriptions   None    PDMP not reviewed this encounter.   Wellington Half, NP 09/25/23 1001

## 2023-09-25 NOTE — Discharge Instructions (Addendum)
 As discussed, follow-up with [facility] urgent care now.

## 2023-09-25 NOTE — ED Triage Notes (Signed)
 Patient reports on Friday while pushing a wagon in car the wagon got stuck and while pushing he heard a pop in right elbow. Patient now reports he has pain and swelling in right elbow. Elbow is hot to touch. Rates pain 6/10 while resting. Patient reports he did not take anything for symptoms.

## 2023-10-21 DIAGNOSIS — R221 Localized swelling, mass and lump, neck: Secondary | ICD-10-CM | POA: Diagnosis not present

## 2023-10-21 DIAGNOSIS — L0211 Cutaneous abscess of neck: Secondary | ICD-10-CM | POA: Diagnosis not present

## 2023-10-23 DIAGNOSIS — L0211 Cutaneous abscess of neck: Secondary | ICD-10-CM | POA: Diagnosis not present

## 2023-11-28 DIAGNOSIS — M5451 Vertebrogenic low back pain: Secondary | ICD-10-CM | POA: Diagnosis not present

## 2023-11-28 DIAGNOSIS — M9903 Segmental and somatic dysfunction of lumbar region: Secondary | ICD-10-CM | POA: Diagnosis not present

## 2023-12-02 DIAGNOSIS — M9903 Segmental and somatic dysfunction of lumbar region: Secondary | ICD-10-CM | POA: Diagnosis not present

## 2023-12-02 DIAGNOSIS — M5451 Vertebrogenic low back pain: Secondary | ICD-10-CM | POA: Diagnosis not present

## 2023-12-04 DIAGNOSIS — M5451 Vertebrogenic low back pain: Secondary | ICD-10-CM | POA: Diagnosis not present

## 2023-12-04 DIAGNOSIS — M9903 Segmental and somatic dysfunction of lumbar region: Secondary | ICD-10-CM | POA: Diagnosis not present

## 2023-12-05 DIAGNOSIS — M9903 Segmental and somatic dysfunction of lumbar region: Secondary | ICD-10-CM | POA: Diagnosis not present

## 2023-12-05 DIAGNOSIS — M5451 Vertebrogenic low back pain: Secondary | ICD-10-CM | POA: Diagnosis not present

## 2023-12-10 DIAGNOSIS — M5451 Vertebrogenic low back pain: Secondary | ICD-10-CM | POA: Diagnosis not present

## 2023-12-10 DIAGNOSIS — M9903 Segmental and somatic dysfunction of lumbar region: Secondary | ICD-10-CM | POA: Diagnosis not present

## 2024-04-19 ENCOUNTER — Telehealth: Admitting: Family

## 2024-04-19 DIAGNOSIS — M109 Gout, unspecified: Secondary | ICD-10-CM | POA: Diagnosis not present

## 2024-04-19 MED ORDER — PREDNISONE 20 MG PO TABS: 40.0000 mg | ORAL_TABLET | Freq: Every day | ORAL | 0 refills | Status: AC

## 2024-04-19 MED ORDER — COLCHICINE 0.6 MG PO TABS: ORAL_TABLET | ORAL | 0 refills | Status: AC

## 2024-04-19 NOTE — Progress Notes (Signed)
